# Patient Record
Sex: Male | Born: 1970
Health system: Southern US, Community
[De-identification: ages and names within clinical notes are randomized; demographics above are authoritative.]

## PROBLEM LIST (undated history)

## (undated) DIAGNOSIS — K219 Gastro-esophageal reflux disease without esophagitis: Secondary | ICD-10-CM

## (undated) DIAGNOSIS — E785 Hyperlipidemia, unspecified: Secondary | ICD-10-CM

## (undated) DIAGNOSIS — E119 Type 2 diabetes mellitus without complications: Secondary | ICD-10-CM

## (undated) DIAGNOSIS — I10 Essential (primary) hypertension: Secondary | ICD-10-CM

## (undated) DIAGNOSIS — T7840XA Allergy, unspecified, initial encounter: Secondary | ICD-10-CM

## (undated) HISTORY — PX: UPPER GASTROINTESTINAL ENDOSCOPY: SHX188

## (undated) HISTORY — DX: Essential (primary) hypertension: I10

## (undated) HISTORY — PX: SMALL INTESTINE SURGERY: SHX150

## (undated) HISTORY — DX: Hyperlipidemia, unspecified: E78.5

## (undated) HISTORY — DX: Allergy, unspecified, initial encounter: T78.40XA

## (undated) HISTORY — DX: Type 2 diabetes mellitus without complications: E11.9

## (undated) HISTORY — PX: COLONOSCOPY: SHX174

## (undated) HISTORY — DX: Gastro-esophageal reflux disease without esophagitis: K21.9

## (undated) HISTORY — PX: NO PAST SURGERIES: SHX2092

---

## 2011-01-26 ENCOUNTER — Other Ambulatory Visit: Payer: Self-pay | Admitting: Gastroenterology

## 2011-01-26 DIAGNOSIS — R1110 Vomiting, unspecified: Secondary | ICD-10-CM

## 2011-01-26 DIAGNOSIS — R12 Heartburn: Secondary | ICD-10-CM

## 2011-02-02 ENCOUNTER — Other Ambulatory Visit: Payer: Self-pay

## 2011-04-29 ENCOUNTER — Ambulatory Visit
Admission: RE | Admit: 2011-04-29 | Discharge: 2011-04-29 | Disposition: A | Discharge status: Home / Self Care | Payer: BC Managed Care – PPO | Source: Ambulatory Visit | Attending: Gastroenterology | Admitting: Gastroenterology

## 2011-04-29 DIAGNOSIS — R12 Heartburn: Secondary | ICD-10-CM

## 2011-04-29 DIAGNOSIS — R1110 Vomiting, unspecified: Secondary | ICD-10-CM

## 2014-04-03 ENCOUNTER — Ambulatory Visit (INDEPENDENT_AMBULATORY_CARE_PROVIDER_SITE_OTHER): Payer: 59 | Admitting: Family Medicine

## 2014-04-03 VITALS — BP 136/82 | HR 69 | Temp 97.7°F | Resp 17 | Ht 75.0 in | Wt 198.0 lb

## 2014-04-03 DIAGNOSIS — E119 Type 2 diabetes mellitus without complications: Secondary | ICD-10-CM

## 2014-04-03 DIAGNOSIS — B360 Pityriasis versicolor: Secondary | ICD-10-CM

## 2014-04-03 DIAGNOSIS — Z7251 High risk heterosexual behavior: Secondary | ICD-10-CM

## 2014-04-03 DIAGNOSIS — Z13220 Encounter for screening for lipoid disorders: Secondary | ICD-10-CM

## 2014-04-03 DIAGNOSIS — Z0000 Encounter for general adult medical examination without abnormal findings: Secondary | ICD-10-CM | POA: Diagnosis not present

## 2014-04-03 DIAGNOSIS — R739 Hyperglycemia, unspecified: Secondary | ICD-10-CM

## 2014-04-03 DIAGNOSIS — Z23 Encounter for immunization: Secondary | ICD-10-CM | POA: Diagnosis not present

## 2014-04-03 LAB — POCT URINALYSIS DIPSTICK
BILIRUBIN UA: NEGATIVE
Glucose, UA: 250
KETONES UA: NEGATIVE
LEUKOCYTES UA: NEGATIVE
Nitrite, UA: NEGATIVE
PH UA: 5.5
Protein, UA: NEGATIVE
Spec Grav, UA: >=1.03
UROBILINOGEN UA: 0.2

## 2014-04-03 LAB — POCT GLYCOSYLATED HEMOGLOBIN (HGB A1C): HEMOGLOBIN A1C: 7.3

## 2014-04-03 LAB — GLUCOSE, POCT (MANUAL RESULT ENTRY): POC Glucose: 114 mg/dl — AB (ref 70–99)

## 2014-04-03 MED ORDER — CLOTRIMAZOLE 1 % EX CREA: 1.0000 "application " | TOPICAL_CREAM | Freq: Two times a day (BID) | CUTANEOUS | Status: DC

## 2014-04-03 MED ORDER — KETOCONAZOLE 200 MG PO TABS: 400.0000 mg | ORAL_TABLET | Freq: Once | ORAL | Status: DC

## 2014-04-03 MED ORDER — METFORMIN HCL 500 MG PO TABS: 500.0000 mg | ORAL_TABLET | Freq: Every day | ORAL | Status: DC

## 2014-04-03 NOTE — Progress Notes (Addendum)
Subjective:  This chart was scribed for Kevin Evans by [facility] Demirci,scribe, at [facility].  This patient was seen in room 2 and the patient's care was started at 3:07 PM.   Patient ID: Kevin Evans, male    DOB: Apr 13, 1970, 44 y.o.   MRN: [id number]  HPI  HPI Comments: Kevin Evans is a 44 y.o. male who presents to [facility] for a complete physical.  He is a new patient here at [facility].  Patient states that last time he was at the doctor, he had elevated blood sugar levels (home readings in the 90's). They put him on metformin once a day but he has not been on that medication for a few years.  Patient last ate 4 hours ago.  He notes he is sexually active and has had new sexual partners since last sti testing. Kevin KitchenHe has not been checked for STI's recently and was last checked for an STI 8-9 years ago. He has had close to  7-8 lifetime partners. His aunt has cancer and his family has a history of diabetes (father's sisters). 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 He was in [place] recently during Christmas noticed rash on upper chest since returning from [place].   Patient denies abdominal pain.    Immunizations: Hepatitis B in 2008, Tetanus 2008, Flu Vaccine in 2008     Exercise: Patient notes that it is difficult to exercise ever since he started driving.    Dentist: Patient has a Pharmacist, community and saw him in September 2015  Eye care provider: Saw his eye doctor 4-5 months ago.    Review of Systems  HENT: Positive for ear discharge (patient notes that he has no acute problems ).   Musculoskeletal: Positive for myalgias and neck pain (neck pain that comes and goes due to driving ).  Skin: Positive for rash (dry skin infront of his knee (chronic)  and on left neck (which he notes is new) ).    Thirteen point ROS reviewed on patient health survey. Positive only as below or as in HPI.       Objective:   Physical Exam    Constitutional: He is oriented to person, place, and time. He appears well-developed and well-nourished.  HENT:  Head: Normocephalic and atraumatic.  Right Ear: External ear normal.  Left Ear: External ear normal.  Mouth/Throat: Oropharynx is clear and moist.  Eyes: Conjunctivae and EOM are normal. Pupils are equal, round, and reactive to light.  Neck: Normal range of motion. Neck supple. No thyromegaly present.  Cardiovascular: Normal rate, regular rhythm, normal heart sounds and intact distal pulses.   Pulmonary/Chest: Effort normal and breath sounds normal. No respiratory distress. He has no wheezes.  Abdominal: Soft. He exhibits no distension. There is no tenderness.  Musculoskeletal: Normal range of motion. He exhibits no edema or tenderness.  Cervical : full range of motion, no focal tenderness.   Lymphadenopathy:    He has no cervical adenopathy.  Neurological: He is alert and oriented to person, place, and time. He has normal reflexes.  Skin: Skin is warm and dry.  Left anterior knee and lower knee there is thickened dry skin without surrounding rash or erythema   Upper chest and left shoulder there are 7 patches with hyperpigmented skin, slightly raised with a faint scale.    No other rash on chest or upper back.    Psychiatric: He has a normal mood and  affect. His behavior is normal.  Vitals reviewed.  Filed Vitals:   04/03/14 1421  BP: 136/82  Pulse: 69  Temp: 97.7 F (36.5 C)  TempSrc: Oral  Resp: 17  Height: 6\' 3"  (1.905 m)  Weight: 198 lb (89.812 kg)  SpO2: 99%     Visual Acuity Screening   Right eye Left eye Both eyes  Without correction:     With correction: 20/20 20/20 20/20        Results for orders placed or performed in visit on 04/03/14  POCT glucose (manual entry)  Result Value Ref Range   POC Glucose 114 (A) 70 - 99 mg/dl  POCT glycosylated hemoglobin (Hb A1C)  Result Value Ref Range   Hemoglobin A1C 7.3   POCT urinalysis dipstick  Result  Value Ref Range   Color, UA yellow    Clarity, UA clear    Glucose, UA 250    Bilirubin, UA neg    Ketones, UA neg    Spec Grav, UA >=1.030    Blood, UA trace-lysed    pH, UA 5.5    Protein, UA neg    Urobilinogen, UA 0.2    Nitrite, UA neg    Leukocytes, UA Negative     Assessment & Plan:   Kevin Evans is a 44 y.o. male Annual physical exam --anticipatory guidance as below in AVS, screening labs above. Health maintenance items as above in HPI.   discussed/recommended as applicable.    Hyperglycemia - Plan: POCT glucose (manual entry), POCT glycosylated hemoglobin (Hb A1C), POCT urinalysis dipstick, Comprehensive metabolic panel, Lipid panel  - diabetic by HGBA1C. Will restart metformin 500mg  QD.  SED  -plan on recheck in 3 months.  - diet and exercise.   Screening for hyperlipidemia - Plan: Comprehensive metabolic panel, Lipid panel drawn. If elevated, would likley recommend statin with underlying diabetes.   High risk sexual behavior - Plan: GC/Chlamydia Probe Amp, HIV antibody, RPR  -multiple prior sexual partners. Testing performed as above, safer sex practices.   Need for prophylactic vaccination and inoculation against influenza - Plan: Flu Vaccine QUAD 36+ mos IM given.   Tinea versicolor - Plan: ketoconazole (NIZORAL) 200 MG tablet, clotrimazole (LOTRIMIN) 1 % cream  - mild, only few areas,  Trial of topical clotrimazole for 2 weeks, then if not improving - can take Nizoral 400mg  x 1. rtc precautions.   Meds ordered this encounter  Medications  . ketoconazole (NIZORAL) 200 MG tablet    Sig: Take 2 tablets (400 mg total) by mouth once. For rash on upper chest.    Dispense:  2 tablet    Refill:  0  . clotrimazole (LOTRIMIN) 1 % cream    Sig: Apply 1 application topically 2 (two) times daily.    Dispense:  30 g    Refill:  0   Patient Instructions  For the rash on your upper chest - this may be due to a fungus - see below. Can try the clotrimazole twice per day  for next 2 weeks, but if this does not resolve, or rash spreads - take the 2 pills i printed - both at once, then sweat/exercise in 1 hour, followed by shower in 2-3 hours.  For your rash on the lower leg, try over the counter Eucerin or Lubriderm, then hydrocortisone cream twice per day if needed for itching. If this worsens - return for recheck.   You should receive a call or letter about your lab results within the next week  to 10 days.   Tinea Versicolor Tinea versicolor is a common yeast infection of the skin. This condition becomes known when the yeast on our skin starts to overgrow (yeast is a normal inhabitant on our skin). This condition is noticed as white or light brown patches on brown skin, and is more evident in the summer on tanned skin. These areas are slightly scaly if scratched. The light patches from the yeast become evident when the yeast creates "holes in your suntan". This is most often noticed in the summer. The patches are usually located on the chest, back, pubis, neck and body folds. However, it may occur on any area of body. Mild itching and inflammation (redness or soreness) may be present. DIAGNOSIS  The diagnosisof this is made clinically (by looking). Cultures from samples are usually not needed. Examination under the microscope may help. However, yeast is normally found on skin. The diagnosis still remains clinical. Examination under Wood's Ultraviolet Light can determine the extent of the infection. TREATMENT  This common infection is usually only of cosmetic (only a concern to your appearance). It is easily treated with dandruff shampoo used during showers or bathing. Vigorous scrubbing will eliminate the yeast over several days time. The light areas in your skin may remain for weeks or months after the infection is cured unless your skin is exposed to sunlight. The lighter or darker spots caused by the fungus that remain after complete treatment are not a sign of  treatment failure; it will take a long time to resolve. Your caregiver may recommend a number of commercial preparations or medication by mouth if home care is not working. Recurrence is common and preventative medication may be necessary. This skin condition is not highly contagious. Special care is not needed to protect close friends and family members. Normal hygiene is usually enough. Follow up is required only if you develop complications (such as a secondary infection from scratching), if recommended by your caregiver, or if no relief is obtained from the preparations used. Document Released: 03/12/2000 Document Revised: 06/07/2011 Document Reviewed: 04/24/2008 [facility] Patient Information 2015 [place], [place]. This information is not intended to replace advice given to you by your health care provider. Make sure you discuss any questions you have with your health care provider.   Keeping you healthy  Get these tests  Blood pressure- Have your blood pressure checked once a year by your healthcare provider.  Normal blood pressure is 120/80.  Weight- Have your body mass index (BMI) calculated to screen for obesity.  BMI is a measure of body fat based on height and weight. You can also calculate your own BMI at GravelBags.it.  Cholesterol- Have your cholesterol checked regularly starting at age 3, sooner may be necessary if you have diabetes, high blood pressure, if a family member developed heart diseases at an early age or if you smoke.   Chlamydia, HIV, and other sexual transmitted disease- Get screened each year until the age of 75 then within three months of each new sexual partner.  Diabetes- Have your blood sugar checked regularly if you have high blood pressure, high cholesterol, a family history of diabetes or if you are overweight.  Get these vaccines  Flu shot- Every fall.  Tetanus shot- Every 10 years.  Menactra- Single dose; prevents meningitis.  Take these  steps  Don't smoke- If you do smoke, ask your healthcare provider about quitting. For tips on how to quit, go to www.smokefree.gov or call 1-800-QUIT-NOW.  Be physically  active- Exercise 5 days a week for at least 30 minutes.  If you are not already physically active start slow and gradually work up to 30 minutes of moderate physical activity.  Examples of moderate activity include walking briskly, mowing the yard, dancing, swimming bicycling, etc.  Eat a healthy diet- Eat a variety of healthy foods such as fruits, vegetables, low fat milk, low fat cheese, yogurt, lean meats, poultry, fish, beans, tofu, etc.  For more information on healthy eating, go to www.thenutritionsource.org  Drink alcohol in moderation- Limit alcohol intake two drinks or less a day.  Never drink and drive.  Dentist- Brush and floss teeth twice daily; visit your dentis twice a year.  Depression-Your emotional health is as important as your physical health.  If you're feeling down, losing interest in things you normally enjoy please talk with your healthcare provider.  Gun Safety- If you keep a gun in your home, keep it unloaded and with the safety lock on.  Bullets should be stored separately.  Helmet use- Always wear a helmet when riding a motorcycle, bicycle, rollerblading or skateboarding.  Safe sex- If you may be exposed to a sexually transmitted infection, use a condom  Seat belts- Seat bels can save your life; always wear one.  Smoke/Carbon Monoxide detectors- These detectors need to be installed on the appropriate level of your home.  Replace batteries at least once a year.  Skin Cancer- When out in the sun, cover up and use sunscreen SPF 15 or higher.  Violence- If anyone is threatening or hurting you, please tell your healthcare provider.        I personally performed the services described in this documentation, which was scribed in my presence. The recorded information has been reviewed and  considered, and addended by me as needed.

## 2014-04-03 NOTE — Patient Instructions (Addendum)
For the rash on your upper chest - this may be due to a fungus - see below. Can try the clotrimazole twice per day for next 2 weeks, but if this does not resolve, or rash spreads - take the 2 pills i printed - both at once, then sweat/exercise in 1 hour, followed by shower in 2-3 hours.  For your rash on the lower leg, try over the counter Eucerin or Lubriderm, then hydrocortisone cream twice per day if needed for itching. If this worsens - return for recheck.   Your blood sugar and 3 month average does indicate early or mild diabetes. Restart metformin once per day, watch diet and exercise as discussed and recheck in 3 months. Return to the clinic or go to the nearest emergency room if any of your symptoms worsen or new symptoms occur.   You should receive a call or letter about your lab results within the next week to 10 days.   Tinea Versicolor Tinea versicolor is a common yeast infection of the skin. This condition becomes known when the yeast on our skin starts to overgrow (yeast is a normal inhabitant on our skin). This condition is noticed as white or light brown patches on brown skin, and is more evident in the summer on tanned skin. These areas are slightly scaly if scratched. The light patches from the yeast become evident when the yeast creates "holes in your suntan". This is most often noticed in the summer. The patches are usually located on the chest, back, pubis, neck and body folds. However, it may occur on any area of body. Mild itching and inflammation (redness or soreness) may be present. DIAGNOSIS  The diagnosisof this is made clinically (by looking). Cultures from samples are usually not needed. Examination under the microscope may help. However, yeast is normally found on skin. The diagnosis still remains clinical. Examination under Wood's Ultraviolet Light can determine the extent of the infection. TREATMENT  This common infection is usually only of cosmetic (only a concern to  your appearance). It is easily treated with dandruff shampoo used during showers or bathing. Vigorous scrubbing will eliminate the yeast over several days time. The light areas in your skin may remain for weeks or months after the infection is cured unless your skin is exposed to sunlight. The lighter or darker spots caused by the fungus that remain after complete treatment are not a sign of treatment failure; it will take a long time to resolve. Your caregiver may recommend a number of commercial preparations or medication by mouth if home care is not working. Recurrence is common and preventative medication may be necessary. This skin condition is not highly contagious. Special care is not needed to protect close friends and family members. Normal hygiene is usually enough. Follow up is required only if you develop complications (such as a secondary infection from scratching), if recommended by your caregiver, or if no relief is obtained from the preparations used. Document Released: 03/12/2000 Document Revised: 06/07/2011 Document Reviewed: 04/24/2008 [facility] Patient Information 2015 [place], [place]. This information is not intended to replace advice given to you by your health care provider. Make sure you discuss any questions you have with your health care provider.   Keeping you healthy  Get these tests  Blood pressure- Have your blood pressure checked once a year by your healthcare provider.  Normal blood pressure is 120/80.  Weight- Have your body mass index (BMI) calculated to screen for obesity.  BMI is a measure  of body fat based on height and weight. You can also calculate your own BMI at [employer]velBags.it.  Cholesterol- Have your cholesterol checked regularly starting at age 60, sooner may be necessary if you have diabetes, high blood pressure, if a family member developed heart diseases at an early age or if you smoke.   Chlamydia, HIV, and other sexual transmitted disease- Get  screened each year until the age of 66 then within three months of each new sexual partner.  Diabetes- Have your blood sugar checked regularly if you have high blood pressure, high cholesterol, a family history of diabetes or if you are overweight.  Get these vaccines  Flu shot- Every fall.  Tetanus shot- Every 10 years.  Menactra- Single dose; prevents meningitis.  Take these steps  Don't smoke- If you do smoke, ask your healthcare provider about quitting. For tips on how to quit, go to www.smokefree.gov or call 1-800-QUIT-NOW.  Be physically active- Exercise 5 days a week for at least 30 minutes.  If you are not already physically active start slow and gradually work up to 30 minutes of moderate physical activity.  Examples of moderate activity include walking briskly, mowing the yard, dancing, swimming bicycling, etc.  Eat a healthy diet- Eat a variety of healthy foods such as fruits, vegetables, low fat milk, low fat cheese, yogurt, lean meats, poultry, fish, beans, tofu, etc.  For more information on healthy eating, go to www.thenutritionsource.org  Drink alcohol in moderation- Limit alcohol intake two drinks or less a day.  Never drink and drive.  Dentist- Brush and floss teeth twice daily; visit your dentis twice a year.  Depression-Your emotional health is as important as your physical health.  If you're feeling down, losing interest in things you normally enjoy please talk with your healthcare provider.  Gun Safety- If you keep a gun in your home, keep it unloaded and with the safety lock on.  Bullets should be stored separately.  Helmet use- Always wear a helmet when riding a motorcycle, bicycle, rollerblading or skateboarding.  Safe sex- If you may be exposed to a sexually transmitted infection, use a condom  Seat belts- Seat bels can save your life; always wear one.  Smoke/Carbon Monoxide detectors- These detectors need to be installed on the appropriate level of your  home.  Replace batteries at least once a year.  Skin Cancer- When out in the sun, cover up and use sunscreen SPF 15 or higher.  Violence- If anyone is threatening or hurting you, please tell your healthcare provider.

## 2014-04-04 LAB — COMPREHENSIVE METABOLIC PANEL
ALK PHOS: 66 U/L (ref 39–117)
ALT: 32 U/L (ref 0–53)
AST: 27 U/L (ref 0–37)
Albumin: 4.7 g/dL (ref 3.5–5.2)
BUN: 12 mg/dL (ref 6–23)
CALCIUM: 10.2 mg/dL (ref 8.4–10.5)
CO2: 30 mEq/L (ref 19–32)
CREATININE: 0.94 mg/dL (ref 0.50–1.35)
Chloride: 99 mEq/L (ref 96–112)
Glucose, Bld: 115 mg/dL — ABNORMAL HIGH (ref 70–99)
POTASSIUM: 4.1 meq/L (ref 3.5–5.3)
SODIUM: 138 meq/L (ref 135–145)
Total Bilirubin: 0.4 mg/dL (ref 0.2–1.2)
Total Protein: 8.3 g/dL (ref 6.0–8.3)

## 2014-04-04 LAB — LIPID PANEL
CHOLESTEROL: 232 mg/dL — AB (ref 0–200)
HDL: 40 mg/dL (ref >39)
LDL Cholesterol: 148 mg/dL — ABNORMAL HIGH (ref 0–99)
Total CHOL/HDL Ratio: 5.8 Ratio
Triglycerides: 221 mg/dL — ABNORMAL HIGH (ref <150)
VLDL: 44 mg/dL — ABNORMAL HIGH (ref 0–40)

## 2014-04-04 LAB — GC/CHLAMYDIA PROBE AMP
CT Probe RNA: NEGATIVE
GC PROBE AMP APTIMA: NEGATIVE

## 2014-04-04 LAB — RPR

## 2014-04-04 LAB — HIV ANTIBODY (ROUTINE TESTING W REFLEX): HIV: NONREACTIVE

## 2014-04-05 ENCOUNTER — Other Ambulatory Visit: Payer: Self-pay | Admitting: Family Medicine

## 2014-04-05 DIAGNOSIS — E785 Hyperlipidemia, unspecified: Secondary | ICD-10-CM

## 2014-04-05 MED ORDER — ATORVASTATIN CALCIUM 10 MG PO TABS: 10.0000 mg | ORAL_TABLET | Freq: Every day | ORAL | Status: DC

## 2014-04-13 ENCOUNTER — Encounter: Payer: Self-pay | Admitting: *Deleted

## 2014-04-23 ENCOUNTER — Other Ambulatory Visit: Payer: Self-pay | Admitting: Family Medicine

## 2014-04-23 DIAGNOSIS — E119 Type 2 diabetes mellitus without complications: Secondary | ICD-10-CM

## 2014-04-23 MED ORDER — BLOOD GLUCOSE MONITORING SUPPL W/DEVICE KIT: PACK | Status: DC

## 2014-07-09 ENCOUNTER — Ambulatory Visit (INDEPENDENT_AMBULATORY_CARE_PROVIDER_SITE_OTHER): Payer: 59 | Admitting: Urgent Care

## 2014-07-09 VITALS — BP 122/74 | HR 68 | Temp 98.0°F | Resp 18 | Ht 76.0 in | Wt 204.0 lb

## 2014-07-09 DIAGNOSIS — E785 Hyperlipidemia, unspecified: Secondary | ICD-10-CM

## 2014-07-09 DIAGNOSIS — E119 Type 2 diabetes mellitus without complications: Secondary | ICD-10-CM

## 2014-07-09 LAB — POCT GLYCOSYLATED HEMOGLOBIN (HGB A1C): HEMOGLOBIN A1C: 7.1

## 2014-07-09 MED ORDER — ATORVASTATIN CALCIUM 10 MG PO TABS: 10.0000 mg | ORAL_TABLET | Freq: Every day | ORAL | Status: DC

## 2014-07-09 MED ORDER — METFORMIN HCL 500 MG PO TABS: 500.0000 mg | ORAL_TABLET | Freq: Every day | ORAL | Status: DC

## 2014-07-09 MED ORDER — BLOOD GLUCOSE MONITORING SUPPL W/DEVICE KIT: PACK | Status: DC

## 2014-07-09 NOTE — Progress Notes (Signed)
    MRN: [id number] DOB: [birth year]  Subjective:   Kevin Evans is a 44 y.o. male presenting for chief complaint of dm check and rx refills  DM - diagnosed 03/2014, started on Metformin 500mg  QD. Denies metallic taste, nausea and vomiting, diarrhea, abdominal pain, chest pain, shob, blurred vision, foot wounds or cuts, numbness and tingling. Also denies polyphagia, polydipsia, polyuria. Denies smoking or alcohol use.   HL - taking atorvastatin inconsistently, states that he had difficulty getting Metformin covered. Denies mental fog, myalgia. Diet and exercise as above.  Denies any other aggravating or relieving factors, no other questions or concerns.  Kevin Evans has a current medication list which includes the following prescription(s): metformin, atorvastatin, blood glucose monitoring suppl, clotrimazole, and ketoconazole. He is allergic to bactrim.  Kevin Evans  has a past medical history of Diabetes mellitus without complication and HIV infection. Also  has no past surgical history on file.  ROS As in subjective.  Objective:   Vitals: BP 122/74 mmHg  Pulse 68  Temp(Src) 98 F (36.7 C) (Oral)  Resp 18  Ht 6\' 4"  (1.93 m)  Wt 204 lb (92.534 kg)  BMI 24.84 kg/m2  SpO2 98%  Physical Exam  Constitutional: He is well-developed, well-nourished, and in no distress.  HENT:  Mouth/Throat: Oropharynx is clear and moist. No oropharyngeal exudate.  Eyes: Conjunctivae are normal. Right eye exhibits no discharge. Left eye exhibits no discharge. No scleral icterus.  Cardiovascular: Normal rate, regular rhythm and intact distal pulses.  Exam reveals no gallop and no friction rub.   No murmur heard. Pulmonary/Chest: No stridor. No respiratory distress. He has no wheezes. He has no rales. He exhibits no tenderness.  Abdominal: He exhibits no distension and no mass. There is no tenderness.  Musculoskeletal: He exhibits no edema or tenderness.  No lesions over feet bilaterally.  Lymphadenopathy:    He  has no cervical adenopathy.  Skin: Skin is warm and dry. No rash noted. No erythema. No pallor.   Results for orders placed or performed in visit on 07/09/14 (from the past 24 hour(s))  POCT glycosylated hemoglobin (Hb A1C)     Status: None   Collection Time: 07/09/14  6:02 PM  Result Value Ref Range   Hemoglobin A1C 7.1    Assessment and Plan :   1. Type 2 diabetes mellitus without complication 2. Controlled diabetes mellitus type II without complication - Well-controlled, advised to continue metformin 500mg  QD, advised dietary modifications and continue exercise. Recheck in 6 months.  3. Hyperlipidemia - Labs pending, restart atorvastatin. Diet and exercise as above. Recheck in 6 months.  Jaynee Eagles, PA-C [facility] [phone] 07/09/2014 6:01 PM

## 2014-07-09 NOTE — Patient Instructions (Signed)
Diabetes Mellitus and Food It is important for you to manage your blood sugar (glucose) level. Your blood glucose level can be greatly affected by what you eat. Eating healthier foods in the appropriate amounts throughout the day at about the same time each day will help you control your blood glucose level. It can also help slow or prevent worsening of your diabetes mellitus. Healthy eating may even help you improve the level of your blood pressure and reach or maintain a healthy weight.  HOW CAN FOOD AFFECT ME? Carbohydrates Carbohydrates affect your blood glucose level more than any other type of food. Your dietitian will help you determine how many carbohydrates to eat at each meal and teach you how to count carbohydrates. Counting carbohydrates is important to keep your blood glucose at a healthy level, especially if you are using insulin or taking certain medicines for diabetes mellitus. Alcohol Alcohol can cause sudden decreases in blood glucose (hypoglycemia), especially if you use insulin or take certain medicines for diabetes mellitus. Hypoglycemia can be a life-threatening condition. Symptoms of hypoglycemia (sleepiness, dizziness, and disorientation) are similar to symptoms of having too much alcohol.  If your health care provider has given you approval to drink alcohol, do so in moderation and use the following guidelines:  Women should not have more than one drink per day, and men should not have more than two drinks per day. One drink is equal to:  12 oz of beer.  5 oz of wine.  1 oz of hard liquor.  Do not drink on an empty stomach.  Keep yourself hydrated. Have water, diet soda, or unsweetened iced tea.  Regular soda, juice, and other mixers might contain a lot of carbohydrates and should be counted. WHAT FOODS ARE NOT RECOMMENDED? As you make food choices, it is important to remember that all foods are not the same. Some foods have fewer nutrients per serving than other  foods, even though they might have the same number of calories or carbohydrates. It is difficult to get your body what it needs when you eat foods with fewer nutrients. Examples of foods that you should avoid that are high in calories and carbohydrates but low in nutrients include:  Trans fats (most processed foods list trans fats on the Nutrition Facts label).  Regular soda.  Juice.  Candy.  Sweets, such as cake, pie, doughnuts, and cookies.  Fried foods. WHAT FOODS CAN I EAT? Have nutrient-rich foods, which will nourish your body and keep you healthy. The food you should eat also will depend on several factors, including:  The calories you need.  The medicines you take.  Your weight.  Your blood glucose level.  Your blood pressure level.  Your cholesterol level. You also should eat a variety of foods, including:  Protein, such as meat, poultry, fish, tofu, nuts, and seeds (lean animal proteins are best).  Fruits.  Vegetables.  Dairy products, such as milk, cheese, and yogurt (low fat is best).  Breads, grains, pasta, cereal, rice, and beans.  Fats such as olive oil, trans fat-free margarine, canola oil, avocado, and olives. DOES EVERYONE WITH DIABETES MELLITUS HAVE THE SAME MEAL PLAN? Because every person with diabetes mellitus is different, there is not one meal plan that works for everyone. It is very important that you meet with a dietitian who will help you create a meal plan that is just right for you. Document Released: 12/10/2004 Document Revised: 03/20/2013 Document Reviewed: 02/09/2013 [employer] Patient Information 2015 [employer]. This   information is not intended to replace advice given to you by your health care provider. Make sure you discuss any questions you have with your health care provider.  

## 2014-12-10 ENCOUNTER — Ambulatory Visit (INDEPENDENT_AMBULATORY_CARE_PROVIDER_SITE_OTHER): Payer: 59 | Admitting: Family Medicine

## 2014-12-10 VITALS — BP 128/72 | HR 74 | Temp 98.1°F | Resp 16 | Ht 76.0 in | Wt 194.8 lb

## 2014-12-10 DIAGNOSIS — R197 Diarrhea, unspecified: Secondary | ICD-10-CM | POA: Diagnosis not present

## 2014-12-10 DIAGNOSIS — R634 Abnormal weight loss: Secondary | ICD-10-CM | POA: Diagnosis not present

## 2014-12-10 DIAGNOSIS — Z789 Other specified health status: Secondary | ICD-10-CM | POA: Diagnosis not present

## 2014-12-10 DIAGNOSIS — E119 Type 2 diabetes mellitus without complications: Secondary | ICD-10-CM | POA: Diagnosis not present

## 2014-12-10 LAB — POCT CBC
GRANULOCYTE PERCENT: 34.5 % — AB (ref 37–80)
HCT, POC: 44.4 % (ref 43.5–53.7)
HEMOGLOBIN: 13.9 g/dL — AB (ref 14.1–18.1)
Lymph, poc: 2.3 (ref 0.6–3.4)
MCH: 25 pg — AB (ref 27–31.2)
MCHC: 31.3 g/dL — AB (ref 31.8–35.4)
MCV: 80.1 fL (ref 80–97)
MID (CBC): 0.2 (ref 0–0.9)
MPV: 7.2 fL (ref 0–99.8)
PLATELET COUNT, POC: 238 10*3/uL (ref 142–424)
POC Granulocyte: 1.3 — AB (ref 2–6.9)
POC LYMPH PERCENT: 59.5 %L — AB (ref 10–50)
POC MID %: 6 %M (ref 0–12)
RBC: 5.54 M/uL (ref 4.69–6.13)
RDW, POC: 13.6 %
WBC: 3.9 10*3/uL — AB (ref 4.6–10.2)

## 2014-12-10 LAB — COMPLETE METABOLIC PANEL WITH GFR
ALT: 43 U/L (ref 9–46)
AST: 29 U/L (ref 10–40)
Albumin: 4.4 g/dL (ref 3.6–5.1)
Alkaline Phosphatase: 56 U/L (ref 40–115)
BUN: 15 mg/dL (ref 7–25)
CHLORIDE: 102 mmol/L (ref 98–110)
CO2: 27 mmol/L (ref 20–31)
CREATININE: 0.9 mg/dL (ref 0.60–1.35)
Calcium: 9.8 mg/dL (ref 8.6–10.3)
GFR, Est African American: >89 mL/min (ref >=60)
GFR, Est Non African American: >89 mL/min (ref >=60)
GLUCOSE: 113 mg/dL — AB (ref 65–99)
POTASSIUM: 4 mmol/L (ref 3.5–5.3)
SODIUM: 140 mmol/L (ref 135–146)
Total Bilirubin: 0.3 mg/dL (ref 0.2–1.2)
Total Protein: 7.6 g/dL (ref 6.1–8.1)

## 2014-12-10 LAB — TSH: TSH: 1.321 u[IU]/mL (ref 0.350–4.500)

## 2014-12-10 LAB — POCT GLYCOSYLATED HEMOGLOBIN (HGB A1C): HEMOGLOBIN A1C: 6.6

## 2014-12-10 LAB — GLUCOSE, POCT (MANUAL RESULT ENTRY): POC GLUCOSE: 113 mg/dL — AB (ref 70–99)

## 2014-12-10 NOTE — Progress Notes (Signed)
Subjective:    Patient ID: Kevin Evans, male    DOB: 10-Jan-1971, 44 y.o.   MRN: [id number] This chart was scribed for Meredith Staggers, MD by Littie Deeds, Medical Scribe. This patient was seen in Room 13 and the patient's care was started at 2:20 PM.    HPI HPI Comments: Kevin Evans is a 44 y.o. male with a history of DM who presents to the [facility] complaining of diarrhea and weight loss. He was last seen by me in January of this year for a physical. Was restarted on metformin 500 mg qd at his January visit. Seen in follow-up April 12th by Gurney Maxin, PA-C. A1c was 7.1 so was continued on metformin 500 mg qd. Of note, his weight that visit was 204. He is down 10 pounds today at 194. Plan for follow-up at last visit in 6 months.   Patient went to [place] from mid-April until the end of May. During the last 3 weeks of that time, he began having watery diarrhea. Patient has had intermittent diarrhea almost every day since then, multiple times a day typically after eating. He occasionally has normal bowel movements. He cannot attribute the diarrhea to certain foods. Patient has tried Pepto-Bismol before but without relief. His roommate sometimes has diarrhea as well at some point after he returned from [place]. His roommate is also on metformin. Patient denies appetite change, fever, urinary symptoms, abdominal pain, abdominal distension, nausea and vomiting. He also denies history of thyroid problems. He states he was lactose-intolerant in the past, but has not had any problems recently as he has been consuming lactose without any problems.  He had a good time in [place] and will return in January.  There are no active problems to display for this patient.  Past Medical History  Diagnosis Date  . Diabetes mellitus without complication   . HIV infection    History reviewed. No pertinent past surgical history. Allergies  Allergen Reactions  . Bactrim [Sulfamethoxazole-Trimethoprim]  Other (See Comments)    Itching all over body    Prior to Admission medications   Medication Sig Start Date End Date Taking? Authorizing Provider  atorvastatin (LIPITOR) 10 MG tablet Take 1 tablet (10 mg total) by mouth daily. 07/09/14  Yes Wallis Bamberg, PA-C  clotrimazole (LOTRIMIN) 1 % cream Apply 1 application topically 2 (two) times daily. 04/03/14  Yes Shade Flood, MD  metFORMIN (GLUCOPHAGE) 500 MG tablet Take 1 tablet (500 mg total) by mouth daily with breakfast. 07/09/14  Yes Wallis Bamberg, PA-C  Blood Glucose Monitoring Suppl W/DEVICE KIT Monitor glucose twice daily Patient not taking: Reported on 12/10/2014 07/09/14   Wallis Bamberg, PA-C   Social History   Social History  . Marital Status: Unknown    Spouse Name: N/A  . Number of Children: N/A  . Years of Education: N/A   Occupational History  . Not on file.   Social History Main Topics  . Smoking status: Never Smoker   . Smokeless tobacco: Never Used  . Alcohol Use: No  . Drug Use: No  . Sexual Activity: Yes    Birth Control/ Protection: Condom   Other Topics Concern  . Not on file   Social History Narrative     Review of Systems  Constitutional: Negative for fever and appetite change.  Gastrointestinal: Positive for diarrhea. Negative for nausea, vomiting, abdominal pain and abdominal distention.  Genitourinary: Negative for dysuria, urgency, frequency, hematuria, decreased urine volume, enuresis and difficulty  urinating.       Objective:   Physical Exam  Constitutional: He is oriented to person, place, and time. He appears well-developed and well-nourished. No distress.  HENT:  Head: Normocephalic and atraumatic.  Mouth/Throat: Oropharynx is clear and moist. No oropharyngeal exudate.  Moist oral mucosa. No oral lesion.  Eyes: Pupils are equal, round, and reactive to light.  Neck: Neck supple. No thyromegaly present.  No nodules.  Cardiovascular: Normal rate, regular rhythm and normal heart sounds.  Exam reveals  no gallop and no friction rub.   No murmur heard. Pulmonary/Chest: Effort normal and breath sounds normal. No respiratory distress. He has no wheezes. He has no rales.  Clear to auscultation bilaterally.   Abdominal: Soft. He exhibits no distension. There is no tenderness.  Musculoskeletal: He exhibits no edema.  Neurological: He is alert and oriented to person, place, and time. No cranial nerve deficit.  Skin: Skin is warm and dry. No rash noted.  Psychiatric: He has a normal mood and affect. His behavior is normal.  Nursing note and vitals reviewed.     Filed Vitals:   12/10/14 1257  BP: 128/72  Pulse: 74  Temp: 98.1 F (36.7 C)  TempSrc: Oral  Resp: 16  Height: $Remove'6\' 4"'oopNQDn$  (1.93 m)  Weight: 194 lb 12.8 oz (88.361 kg)  SpO2: 98%   Lab Results  Component Value Date   HGBA1C 6.6 12/10/2014       Assessment & Plan:  Kevin Evans is a 44 y.o. male Weight loss, abnormal, DiarrheaH/O foreign travel - Plan: Gastrointestinal Pathogen Panel PCR  - Plan: Gastrointestinal Pathogen Panel PCR, COMPLETE METABOLIC PANEL WITH GFR, POCT CBC  - persistent diarrhea since 1 travel. Will check GI pathogen panel, but if this is negative, and not improving with a trial off of metformin for a few days, consider GI eval.  patient was advised by staff of overall control of diabetes by his A1c. This was resulted after patient left.. Can try temporary cessation of metformin. If diarrhea persists off of metformin, then may need GI eval.   RTC precautions if worsening.  Type 2 diabetes mellitus without complication - Plan: POCT glucose (manual entry), POCT glycosylated hemoglobin (Hb A1C)  -  Controlled as above. Contrast metformin temporarily to see if this is contributing to his diarrhea. If so, may need to try another agent..   If diarrhea persists off of metformin, restart at same dose for continued control.    No orders of the defined types were placed in this encounter.   Patient Instructions  We will  call you about the results of your hemoglobin A1c later today. As long as your blood sugars are okay, you can try to stop the metformin for a couple of days to see if this is causing your diarrhea.  Once we receive the results of the other stool tests, and other blood tests, we can decide on next step. If your diarrhea is persisting and these labs are normal, I will likely refer you to a gastroenterologist to determine cause of the diarrhea. Return to the clinic or go to the nearest emergency room if any of your symptoms worsen or new symptoms occur.  Diarrhea Diarrhea is frequent loose and watery bowel movements. It can cause you to feel weak and dehydrated. Dehydration can cause you to become tired and thirsty, have a dry mouth, and have decreased urination that often is dark yellow. Diarrhea is a sign of another problem, most often an infection that  will not last long. In most cases, diarrhea typically lasts 2-3 days. However, it can last longer if it is a sign of something more serious. It is important to treat your diarrhea as directed by your caregiver to lessen or prevent future episodes of diarrhea. CAUSES  Some common causes include:  Gastrointestinal infections caused by viruses, bacteria, or parasites.  Food poisoning or food allergies.  Certain medicines, such as antibiotics, chemotherapy, and laxatives.  Artificial sweeteners and fructose.  Digestive disorders. HOME CARE INSTRUCTIONS  Ensure adequate fluid intake (hydration): Have 1 cup (8 oz) of fluid for each diarrhea episode. Avoid fluids that contain simple sugars or sports drinks, fruit juices, whole milk products, and sodas. Your urine should be clear or pale yellow if you are drinking enough fluids. Hydrate with an oral rehydration solution that you can purchase at pharmacies, retail stores, and online. You can prepare an oral rehydration solution at home by mixing the following ingredients together:   - tsp table salt.    tsp baking soda.   tsp salt substitute containing potassium chloride.  1  tablespoons sugar.  1 L (34 oz) of water.  Certain foods and beverages may increase the speed at which food moves through the gastrointestinal (GI) tract. These foods and beverages should be avoided and include:  Caffeinated and alcoholic beverages.  High-fiber foods, such as raw fruits and vegetables, nuts, seeds, and whole grain breads and cereals.  Foods and beverages sweetened with sugar alcohols, such as xylitol, sorbitol, and mannitol.  Some foods may be well tolerated and may help thicken stool including:  Starchy foods, such as rice, toast, pasta, low-sugar cereal, oatmeal, grits, baked potatoes, crackers, and bagels.  Bananas.  Applesauce.  Add probiotic-rich foods to help increase healthy bacteria in the GI tract, such as yogurt and fermented milk products.  Wash your hands well after each diarrhea episode.  Only take over-the-counter or prescription medicines as directed by your caregiver.  Take a warm bath to relieve any burning or pain from frequent diarrhea episodes. SEEK IMMEDIATE MEDICAL CARE IF:   You are unable to keep fluids down.  You have persistent vomiting.  You have blood in your stool, or your stools are black and tarry.  You do not urinate in 6-8 hours, or there is only a small amount of very dark urine.  You have abdominal pain that increases or localizes.  You have weakness, dizziness, confusion, or light-headedness.  You have a severe headache.  Your diarrhea gets worse or does not get better.  You have a fever or persistent symptoms for more than 2-3 days.  You have a fever and your symptoms suddenly get worse. MAKE SURE YOU:   Understand these instructions.  Will watch your condition.  Will get help right away if you are not doing well or get worse. Document Released: 03/05/2002 Document Revised: 07/30/2013 Document Reviewed: 11/21/2011 [facility] Patient  Information 2015 [place], [place]. This information is not intended to replace advice given to you by your health care provider. Make sure you discuss any questions you have with your health care provider.     I personally performed the services described in this documentation, which was scribed in my presence. The recorded information has been reviewed and considered, and addended by me as needed.

## 2014-12-10 NOTE — Patient Instructions (Signed)
We will call you about the results of your hemoglobin A1c later today. As long as your blood sugars are okay, you can try to stop the metformin for a couple of days to see if this is causing your diarrhea.  Once we receive the results of the other stool tests, and other blood tests, we can decide on next step. If your diarrhea is persisting and these labs are normal, I will likely refer you to a gastroenterologist to determine cause of the diarrhea. Return to the clinic or go to the nearest emergency room if any of your symptoms worsen or new symptoms occur.  Diarrhea Diarrhea is frequent loose and watery bowel movements. It can cause you to feel weak and dehydrated. Dehydration can cause you to become tired and thirsty, have a dry mouth, and have decreased urination that often is dark yellow. Diarrhea is a sign of another problem, most often an infection that will not last long. In most cases, diarrhea typically lasts 2-3 days. However, it can last longer if it is a sign of something more serious. It is important to treat your diarrhea as directed by your caregiver to lessen or prevent future episodes of diarrhea. CAUSES  Some common causes include:  Gastrointestinal infections caused by viruses, bacteria, or parasites.  Food poisoning or food allergies.  Certain medicines, such as antibiotics, chemotherapy, and laxatives.  Artificial sweeteners and fructose.  Digestive disorders. HOME CARE INSTRUCTIONS  Ensure adequate fluid intake (hydration): Have 1 cup (8 oz) of fluid for each diarrhea episode. Avoid fluids that contain simple sugars or sports drinks, fruit juices, whole milk products, and sodas. Your urine should be clear or pale yellow if you are drinking enough fluids. Hydrate with an oral rehydration solution that you can purchase at pharmacies, retail stores, and online. You can prepare an oral rehydration solution at home by mixing the following ingredients together:   - tsp table  salt.   tsp baking soda.   tsp salt substitute containing potassium chloride.  1  tablespoons sugar.  1 L (34 oz) of water.  Certain foods and beverages may increase the speed at which food moves through the gastrointestinal (GI) tract. These foods and beverages should be avoided and include:  Caffeinated and alcoholic beverages.  High-fiber foods, such as raw fruits and vegetables, nuts, seeds, and whole grain breads and cereals.  Foods and beverages sweetened with sugar alcohols, such as xylitol, sorbitol, and mannitol.  Some foods may be well tolerated and may help thicken stool including:  Starchy foods, such as rice, toast, pasta, low-sugar cereal, oatmeal, grits, baked potatoes, crackers, and bagels.  Bananas.  Applesauce.  Add probiotic-rich foods to help increase healthy bacteria in the GI tract, such as yogurt and fermented milk products.  Wash your hands well after each diarrhea episode.  Only take over-the-counter or prescription medicines as directed by your caregiver.  Take a warm bath to relieve any burning or pain from frequent diarrhea episodes. SEEK IMMEDIATE MEDICAL CARE IF:   You are unable to keep fluids down.  You have persistent vomiting.  You have blood in your stool, or your stools are black and tarry.  You do not urinate in 6-8 hours, or there is only a small amount of very dark urine.  You have abdominal pain that increases or localizes.  You have weakness, dizziness, confusion, or light-headedness.  You have a severe headache.  Your diarrhea gets worse or does not get better.  You have a fever  or persistent symptoms for more than 2-3 days.  You have a fever and your symptoms suddenly get worse. MAKE SURE YOU:   Understand these instructions.  Will watch your condition.  Will get help right away if you are not doing well or get worse. Document Released: 03/05/2002 Document Revised: 07/30/2013 Document Reviewed:  11/21/2011 [facility] Patient Information 2015 [place], [place]. This information is not intended to replace advice given to you by your health care provider. Make sure you discuss any questions you have with your health care provider.

## 2014-12-12 LAB — GASTROINTESTINAL PATHOGEN PANEL PCR
C. DIFFICILE TOX A/B, PCR: NEGATIVE
CAMPYLOBACTER, PCR: NEGATIVE
Cryptosporidium, PCR: NEGATIVE
E COLI 0157, PCR: NEGATIVE
E coli (ETEC) LT/ST PCR: NEGATIVE
E coli (STEC) stx1/stx2, PCR: NEGATIVE
GIARDIA LAMBLIA, PCR: NEGATIVE
Norovirus, PCR: NEGATIVE
ROTAVIRUS, PCR: NEGATIVE
SALMONELLA, PCR: NEGATIVE
SHIGELLA, PCR: NEGATIVE

## 2014-12-13 ENCOUNTER — Telehealth: Payer: Self-pay

## 2014-12-13 NOTE — Telephone Encounter (Signed)
Pt advised and understood. 

## 2014-12-13 NOTE — Telephone Encounter (Signed)
Yes, let's have him restart the metformin at 500 mg once daily. I see he has been on the medication since January so it's possible the GI upset wasn't from the medicine. He needs to take the med with food. If his GI upset returns after restarting the med he should come back to clinic so someone can see him to determine which if any diabetes med he needs to be on.

## 2014-12-13 NOTE — Telephone Encounter (Signed)
Taken off of medication - since he is off of the medication he has had  no diarrhea.  What does he do now?  Resume taking the medication??   [phone]

## 2014-12-13 NOTE — Telephone Encounter (Signed)
Called and spoke with patient to gain clarification on which medication he was calling about. Pt states he was taken off of the Metformin and has not had anymore diarrhea. He now wants to know should he resume the medication or not. Please advise. Pt would also like to go over lab results.

## 2014-12-13 NOTE — Telephone Encounter (Signed)
Pt called requesting lab results. Please comment on results.  Thank you.

## 2014-12-29 ENCOUNTER — Other Ambulatory Visit: Payer: Self-pay | Admitting: Urgent Care

## 2015-02-05 ENCOUNTER — Ambulatory Visit (INDEPENDENT_AMBULATORY_CARE_PROVIDER_SITE_OTHER): Payer: Self-pay | Admitting: Family Medicine

## 2015-02-05 VITALS — BP 138/78 | HR 64 | Temp 98.3°F | Resp 16 | Ht 77.0 in | Wt 196.0 lb

## 2015-02-05 DIAGNOSIS — Z024 Encounter for examination for driving license: Secondary | ICD-10-CM

## 2015-02-05 DIAGNOSIS — R197 Diarrhea, unspecified: Secondary | ICD-10-CM

## 2015-02-05 DIAGNOSIS — R634 Abnormal weight loss: Secondary | ICD-10-CM

## 2015-02-05 DIAGNOSIS — Z021 Encounter for pre-employment examination: Secondary | ICD-10-CM

## 2015-02-05 NOTE — Patient Instructions (Addendum)
I will refer you to gastroenterology for the diarrhea. Follow up next month for diabetes and repeat urine test as trace blood seen on screening test today.   Over the counter cortisone cream if needed up to 3 times per day and Eucerin or Lubriderm for dry skin on your knees but this appears to be form pressure when kneeling. Try to use more cushioned pad with kneeling.   Return to the clinic or go to the nearest emergency room if any of your symptoms worsen or new symptoms occur.  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

## 2015-02-05 NOTE — Progress Notes (Signed)
Subjective:  This chart was scribed for [name], MD by Moises Blood, Medical Scribe. This patient was seen in room 13 and the patient's care was started 1:37 PM.   Patient ID: Karmen Bongo, male    DOB: June 28, 1970, 44 y.o.   MRN: [id number]  HPI Mir Fullilove is a 44 y.o. male Pt here for DOT physical. H/o type 2 DM, he was having weight loss last seen sept 13th, A1c was 6.6. Tried being off metformin, diarrhea improved. Per phone note, was restarted on sept 16th metformin 500 mg qd. He is still having some diarrhea and very soft stools, since may. He's lost about 16 lbs since it started. He has 1-3 BM a day. GI pathogen panel that was normal. He's planning to travel back to [place] in January. He notes still having itchy rash under his knees, and he's been applying OTC cream on the area.   He was previously given 2-year card. He's taking metformin and lipitor. He denies any eye problems. He wears glasses, and uses them to drive. He denies sleep apnea, snoring, or any daytime hypersomnia. He denies any hematuria. He denies any inguinal pain.   He prays on his knee 5 times a day, kneeling multiple times. He puts a rug down to pray.   Allergies  Allergen Reactions  . Bactrim [Sulfamethoxazole-Trimethoprim] Other (See Comments)    Itching all over body    Review of Systems  Eyes: Negative for pain.  Respiratory: Negative for apnea.   Cardiovascular: Negative for chest pain.  Gastrointestinal: Positive for diarrhea. Negative for nausea, vomiting, abdominal pain, constipation and blood in stool.  Genitourinary: Negative for hematuria.  Skin: Positive for rash (knees).       Objective:   Physical Exam  Constitutional: He is oriented to person, place, and time. He appears well-developed and well-nourished.  HENT:  Head: Normocephalic and atraumatic.  Right Ear: External ear normal.  Left Ear: External ear normal.  Mouth/Throat: Oropharynx is clear and moist.  Eyes: Conjunctivae and  EOM are normal. Pupils are equal, round, and reactive to light.  Neck: Normal range of motion. Neck supple. No thyromegaly present.  Cardiovascular: Normal rate, regular rhythm, normal heart sounds and intact distal pulses.   Pulmonary/Chest: Effort normal and breath sounds normal. No respiratory distress. He has no wheezes.  Abdominal: Soft. He exhibits no distension. There is no tenderness. Hernia confirmed negative in the right inguinal area and confirmed negative in the left inguinal area.  Musculoskeletal: Normal range of motion. He exhibits no edema or tenderness.  Lymphadenopathy:    He has no cervical adenopathy.  Neurological: He is alert and oriented to person, place, and time. He has normal reflexes.  Skin: Skin is warm and dry.  Psychiatric: He has a normal mood and affect. His behavior is normal.    Filed Vitals:   02/05/15 1308  BP: 138/78  Pulse: 64  Temp: 98.3 F (36.8 C)  TempSrc: Oral  Resp: 16  Height: 6\' 5"  (1.956 m)  Weight: 196 lb (88.905 kg)  SpO2: 98%      Assessment & Plan:  Kody Brandl is a 44 y.o. male Diarrhea, unspecified type, weight loss - Plan: Ambulatory referral to Gastroenterology  -Unchanged with trial off of metformin. Persistent symptoms with weight loss previously.   -refer to gastroenterology for further testing or workup.   Calluses on left greater than right tibial tuberosity area of knees. Discussed previously Suspect this is from repetitive pressure on knees  He can try a softer cushioned mat or rug when kneeling.and for thickened skin can use Eucerin or Lubriderm over-the-counter. Also if itching in this area can use over-the-counter cortisone cream. RTC precautions.   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 Trace blood on UA, asymptomatic.  - follow-up with PCP in one month for repeat urine testing and assessment of hyperlipidemia and diabetes. See  DOT paperwork scanned.  No orders of the defined types were placed in this encounter.   Patient Instructions  I will refer you to gastroenterology for the diarrhea. Follow up next month for diabetes and repeat urine test as trace blood seen on screening test today.   Over the counter cortisone cream if needed up to 3 times per day and Eucerin or Lubriderm for dry skin on your knees but this appears to be form pressure when kneeling. Try to use more cushioned pad with kneeling.   Return to the clinic or go to the nearest emergency room if any of your symptoms worsen or new symptoms occur.  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        By signing my name below, I, Moises Blood, attest that this documentation has been prepared under the direction and in the presence of [name], MD. Electronically Signed: Moises Blood, [place]. 02/05/2015 , 1:37 PM .  I personally performed the services described in this documentation, which was scribed in my presence. The recorded information has been reviewed and considered, and addended by me as needed.

## 2015-02-10 ENCOUNTER — Ambulatory Visit (INDEPENDENT_AMBULATORY_CARE_PROVIDER_SITE_OTHER): Payer: 59 | Admitting: Internal Medicine

## 2015-02-10 ENCOUNTER — Other Ambulatory Visit: Payer: 59

## 2015-02-10 ENCOUNTER — Encounter: Payer: Self-pay | Admitting: *Deleted

## 2015-02-10 VITALS — BP 112/74 | HR 80 | Ht 75.25 in | Wt 200.1 lb

## 2015-02-10 DIAGNOSIS — R634 Abnormal weight loss: Secondary | ICD-10-CM | POA: Diagnosis not present

## 2015-02-10 DIAGNOSIS — K529 Noninfective gastroenteritis and colitis, unspecified: Secondary | ICD-10-CM | POA: Diagnosis not present

## 2015-02-10 DIAGNOSIS — F458 Other somatoform disorders: Secondary | ICD-10-CM | POA: Diagnosis not present

## 2015-02-10 DIAGNOSIS — R0989 Other specified symptoms and signs involving the circulatory and respiratory systems: Secondary | ICD-10-CM

## 2015-02-10 DIAGNOSIS — K219 Gastro-esophageal reflux disease without esophagitis: Secondary | ICD-10-CM | POA: Diagnosis not present

## 2015-02-10 MED ORDER — NA SULFATE-K SULFATE-MG SULF 17.5-3.13-1.6 GM/177ML PO SOLN: ORAL | Status: DC

## 2015-02-10 NOTE — Patient Instructions (Signed)
You have been scheduled for an endoscopy and colonoscopy. Please follow the written instructions given to you at your visit today. Please pick up your prep supplies at the pharmacy within the next 1-3 days. If you use inhalers (even only as needed), please bring them with you on the day of your procedure. Your physician has requested that you go to www.startemmi.com and enter the access code given to you at your visit today. This web site gives a general overview about your procedure. However, you should still follow specific instructions given to you by our office regarding your preparation for the procedure.  Your physician has requested that you go to the basement for the following lab work before leaving today: Fecal elastace, WBC stool

## 2015-02-10 NOTE — Progress Notes (Signed)
Patient ID: Kevin Evans, male   DOB: [birth year], 44 y.o.   MRN: [id number] HPI: Onathan Fadness is a  48 -year-old [place] male with past medical history of GERD, diabetes on metformin, hypertension and hyperlipidemia who is seen in consultation at the request of Dr. Nyoka Cowden to evaluate chronic loose stools/diarrhea and weight loss. He reports that while traveling to [place] in May 2016 he developed diarrhea associated with lower abdominal cramping. Since this time he has had issues with change in bowel habit and chronic diarrhea. It is better overall symptomatically than it was when it started though bowel habits have not returned to normal. He has lost 10-15 pounds since May 2016. Some days now he has more normal stools while other days he'll have 4-5 loose stools. Particularly after eating. Denies black or tarry stools. Denies blood in stool. Appetite has been good though he admits he may be eating less to avoid the postprandial loose stool which is quite urgent. He does take metformin 500 mg daily. He tried stopping the medication and loose stools persisted. He has a history of heartburn and lactose intolerance though he notes since his loose stools have started he no longer is lactose intolerant and he's had resolution of gas and bloating. He does have a long history of globus sensation previous he worked up by Dr. Michail Sermon. Barium swallow performed 3 years ago showed mild reflux but was otherwise unremarkable. He did take omeprazole for some time but doesn't remember if it helped globus sensation. He denies true heartburn but does still have mild globus which she does not find troublesome. He denies dysphagia or odynophagia. He had a GI pathogen panel which was negative for infectious etiology. He denies a family history of GI tract malignancy, IBD. He currently works as a Administrator. He is divorced. He does not use alcohol or illicit drugs. No tobacco use.  Past Medical History  Diagnosis Date  . Diabetes  mellitus without complication ([place])   . GERD (gastroesophageal reflux disease)   . HLD (hyperlipidemia)   . HTN (hypertension)     History reviewed. No pertinent past surgical history.  Outpatient Prescriptions Prior to Visit  Medication Sig Dispense Refill  . atorvastatin (LIPITOR) 10 MG tablet Take 1 tablet (10 mg total) by mouth daily. 90 tablet 1  . metFORMIN (GLUCOPHAGE) 500 MG tablet TAKE ONE TABLET BY MOUTH ONCE DAILY WITH BREAKFAST 90 tablet 0   No facility-administered medications prior to visit.    Allergies  Allergen Reactions  . Bactrim [Sulfamethoxazole-Trimethoprim] Other (See Comments)    Itching all over body     Family History  Problem Relation Age of Onset  . Hypertension Mother   . Stroke Mother   . Diabetes Paternal Aunt   . Diabetes Paternal Uncle   . Breast cancer Paternal Aunt     Social History  Substance Use Topics  . Smoking status: Never Smoker   . Smokeless tobacco: Never Used  . Alcohol Use: No    ROS: As per history of present illness, otherwise negative  BP 112/74 mmHg  Pulse 80  Ht 6' 3.25" (1.911 m)  Wt 200 lb 2 oz (90.776 kg)  BMI 24.86 kg/m2 Constitutional: Well-developed and well-nourished. No distress. HEENT: Normocephalic and atraumatic. Oropharynx is clear and moist. No oropharyngeal exudate. Conjunctivae are normal.  No scleral icterus. Neck: Neck supple. Trachea midline. Cardiovascular: Normal rate, regular rhythm and intact distal pulses. No M/R/G Pulmonary/chest: Effort normal and breath sounds normal. No wheezing, rales or  rhonchi. Abdominal: Soft, nontender, nondistended. Bowel sounds active throughout. There are no masses palpable. No hepatosplenomegaly. Extremities: no clubbing, cyanosis, or edema Lymphadenopathy: No cervical adenopathy noted. Neurological: Alert and oriented to person place and time. Skin: Skin is warm and dry. No rashes noted. Psychiatric: Normal mood and affect. Behavior is normal.  RELEVANT  LABS AND IMAGING: CBC    Component Value Date/Time   WBC 3.9* 12/10/2014 1510   RBC 5.54 12/10/2014 1510   HGB 13.9* 12/10/2014 1510   HCT 44.4 12/10/2014 1510   MCV 80.1 12/10/2014 1510   MCH 25.0* 12/10/2014 1510   MCHC 31.3* 12/10/2014 1510    CMP     Component Value Date/Time   NA 140 12/10/2014 1505   K 4.0 12/10/2014 1505   CL 102 12/10/2014 1505   CO2 27 12/10/2014 1505   GLUCOSE 113* 12/10/2014 1505   BUN 15 12/10/2014 1505   CREATININE 0.90 12/10/2014 1505   CALCIUM 9.8 12/10/2014 1505   PROT 7.6 12/10/2014 1505   ALBUMIN 4.4 12/10/2014 1505   AST 29 12/10/2014 1505   ALT 43 12/10/2014 1505   ALKPHOS 56 12/10/2014 1505   BILITOT 0.3 12/10/2014 1505   GFRNONAA >89 12/10/2014 1505   GFRAA >89 12/10/2014 1505   GI pathogen panel -- neg  Lab Results  Component Value Date   TSH 1.321 12/10/2014   HIV neg RPR neg  ASSESSMENT/PLAN:  35 -year-old [place] male with past medical history of GERD, diabetes on metformin, hypertension and hyperlipidemia who is seen in consultation at the request of Dr. Nyoka Cowden to evaluate chronic loose stools/diarrhea and weight loss.  1. Chronic loose stools/weight loss -- symptoms started fairly acutely while traveling to [place]. This makes an infectious etiology quite likely. GI pathogen panel recently negative making more chronic infection unlikely. He may have a postinfectious irritable bowel though one would not expect the weight loss. I recommended stool test for fecal elastase and fecal leukocytes. I've also recommended upper endoscopy and colonoscopy to further evaluate diarrhea, history of GERD, and weight loss. We discussed the risks, benefits and alternatives to endoscopy and he is agreeable to proceed. If negative would consider a probiotic for postinfectious IBS. Other considerations include bacterial overgrowth.  [id number] R Greene, [address] [place], [place] [id number]

## 2015-02-22 LAB — PANCREATIC ELASTASE, FECAL: Pancreatic Elastase-1, Stool: >500 mcg/g

## 2015-03-06 ENCOUNTER — Ambulatory Visit (INDEPENDENT_AMBULATORY_CARE_PROVIDER_SITE_OTHER): Payer: 59 | Admitting: Physician Assistant

## 2015-03-06 VITALS — BP 126/86 | HR 76 | Temp 97.9°F | Resp 16 | Ht 76.0 in | Wt 199.0 lb

## 2015-03-06 DIAGNOSIS — Z23 Encounter for immunization: Secondary | ICD-10-CM

## 2015-03-06 DIAGNOSIS — Z79899 Other long term (current) drug therapy: Secondary | ICD-10-CM

## 2015-03-06 DIAGNOSIS — Z418 Encounter for other procedures for purposes other than remedying health state: Secondary | ICD-10-CM | POA: Diagnosis not present

## 2015-03-06 MED ORDER — MEFLOQUINE HCL 250 MG PO TABS: 250.0000 mg | ORAL_TABLET | ORAL | Status: DC

## 2015-03-06 NOTE — Patient Instructions (Addendum)
Please check with your school, if you have had the MMR (which you should have).   If not, you can call me and let me know.  I will place a future lab for you to just come in.   Meningococcal Diphtheria Toxoid Conjugate Vaccine What is this medicine? MENINGOCOCCAL DIPHTHERIA TOXOID CONJUGATE VACCINE (muh ning goh KOK kal dif THEER ee uh TOK soid KON juh geyt vak SEEN) is a vaccine to protect from bacterial meningitis. This vaccine does not contain live bacteria. It will not cause a meningitis. This medicine may be used for other purposes; ask your health care provider or pharmacist if you have questions. What should I tell my health care provider before I take this medicine? They need to know if you have any of these conditions: -bleeding disorder -fever or infection -history of Guillain-Barre syndrome -immune system problems -an unusual or allergic reaction to diphtheria toxoid, meningococcal vaccine, latex, other medicines, foods, dyes, or preservatives -pregnant or trying to get pregnant -breast-feeding How should I use this medicine? This medicine is for injection into a muscle. It is given by a health care professional in a hospital or clinic setting. A copy of Vaccine Information Statements will be given before each vaccination. Read this sheet carefully each time. The sheet may change frequently. Talk to your pediatrician regarding the use of this medicine in children. While some brands of this drug may be prescribed for children as young as 4 months of age for selected conditions, precautions do apply. Overdosage: If you think you have taken too much of this medicine contact a poison control center or emergency room at once. NOTE: This medicine is only for you. Do not share this medicine with others. What if I miss a dose? This does not apply. What may interact with this medicine? -adalimumab -anakinra -infliximab -medicines for organ transplant -medicines to treat  cancer -medicines used during some procedures to diagnose a medical condition -other vaccines -some medicines for arthritis -steroid medicines like prednisone or cortisone This list may not describe all possible interactions. Give your health care provider a list of all the medicines, herbs, non-prescription drugs, or dietary supplements you use. Also tell them if you smoke, drink alcohol, or use illegal drugs. Some items may interact with your medicine. What should I watch for while using this medicine? Report any side effects that are worrisome to your doctor right away. Call your doctor if you have any unusual symptoms within 6 weeks of getting this vaccine. This vaccine may not protect from all meningitis infections. Women should inform their doctor if they wish to become pregnant or think they might be pregnant. Talk to your health care professional or pharmacist for more information. What side effects may I notice from receiving this medicine? Side effects that you should report to your doctor or health care professional as soon as possible: -allergic reactions like skin rash, itching or hives, swelling of the face, lips, or tongue -breathing problems -feeling faint or lightheaded, falls -fever over 102 degrees F -muscle weakness -unusual drooping or paralysis of face Side effects that usually do not require medical attention (report to your doctor or health care professional if they continue or are bothersome): -chills -diarrhea -headache -loss of appetite -muscle aches and pains -pain at site where injected -tired This list may not describe all possible side effects. Call your doctor for medical advice about side effects. You may report side effects to FDA at 1-800-FDA-1088. Where should I keep my medicine? This  drug is given in a hospital or clinic and will not be stored at home. NOTE: This sheet is a summary. It may not cover all possible information. If you have questions  about this medicine, talk to your doctor, pharmacist, or health care provider.    2016, Elsevier/Gold Standard. (2009-08-05 21:41:10)  Hepatitis A Vaccine, Inactivated suspension for injection What is this medicine? HEPATITIS A VACCINE (hep uh TAHY tis A VAK seen) is a vaccine to protect from an infection with the hepatitis A virus. This vaccine does not contain the live virus. It will not cause a hepatitis infection. This vaccine is also used with immunoglobulin to prevent infection in people who have been exposed to hepatitis A. This medicine may be used for other purposes; ask your health care provider or pharmacist if you have questions. What should I tell my health care provider before I take this medicine? They need to know if you have any of these conditions: -bleeding disorder -fever or infection -heart disease -immune system problems -an unusual or allergic reaction to hepatitis A vaccine, latex, neomycin, other medicines, foods, dyes, or preservatives -pregnant or trying to get pregnant -breast-feeding How should I use this medicine? This vaccine is for injection into a muscle. It is given by a health care professional. A copy of Vaccine Information Statements will be given before each vaccination. Read this sheet carefully each time. The sheet may change frequently. Talk to your pediatrician regarding the use of this medicine in children. While this drug may be prescribed for children as young as 36 months of age for selected conditions, precautions do apply. Overdosage: If you think you have taken too much of this medicine contact a poison control center or emergency room at once. NOTE: This medicine is only for you. Do not share this medicine with others. What if I miss a dose? This does not apply. What may interact with this medicine? -medicines to treat cancer -medicines that suppress your immune function like adalimumab, anakinra, etanercept, infliximab -steroid medicines  like prednisone or cortisone This list may not describe all possible interactions. Give your health care provider a list of all the medicines, herbs, non-prescription drugs, or dietary supplements you use. Also tell them if you smoke, drink alcohol, or use illegal drugs. Some items may interact with your medicine. What should I watch for while using this medicine? See your health care provider for a booster shot of this vaccine as directed. Tell your doctor right away if you have any serious or unusual side effects after getting this vaccine. You will not have protection from the hepatitis A virus for at least 8 to 10 days after your first injection. The length of time you will have protection from hepatitis A virus infection is not known. Check with your doctor if you have questions about your immunity. See your doctor before you travel out of the country. What side effects may I notice from receiving this medicine? Side effects that you should report to your doctor or health care professional as soon as possible: -allergic reactions like skin rash, itching or hives, swelling of the face, lips, or tongue -breathing problems -seizures -yellowing of the eyes or skin Side effects that usually do not require medical attention (report to your doctor or health care professional if they continue or are bothersome): -diarrhea -fever -loss of appetite -muscle pain -nausea -pain, redness, swelling or irritation at site where injected -tiredness This list may not describe all possible side effects. Call your doctor  for medical advice about side effects. You may report side effects to FDA at 1-800-FDA-1088. Where should I keep my medicine? This drug is given in a hospital or clinic and will not be stored at home. NOTE: This sheet is a summary. It may not cover all possible information. If you have questions about this medicine, talk to your doctor, pharmacist, or health care provider.    2016,  Elsevier/Gold Standard. (2013-07-16 13:19:40)  Td Vaccine (Tetanus and Diphtheria): What You Need to Know 1. Why get vaccinated? Tetanus  and diphtheria are very serious diseases. They are rare in the [place] today, but people who do become infected often have severe complications. Td vaccine is used to protect adolescents and adults from both of these diseases. Both tetanus and diphtheria are infections caused by bacteria. Diphtheria spreads from person to person through coughing or sneezing. Tetanus-causing bacteria enter the body through cuts, scratches, or wounds. TETANUS (Lockjaw) causes painful muscle tightening and stiffness, usually all over the body.  It can lead to tightening of muscles in the head and neck so you can't open your mouth, swallow, or sometimes even breathe. Tetanus kills about 1 out of every 10 people who are infected even after receiving the best medical care. DIPHTHERIA can cause a thick coating to form in the back of the throat.  It can lead to breathing problems, paralysis, heart failure, and death. Before vaccines, as many as 200,000 cases of diphtheria and hundreds of cases of tetanus were reported in the [place] each year. Since vaccination began, reports of cases for both diseases have dropped by about 99%. 2. Td vaccine Td vaccine can protect adolescents and adults from tetanus and diphtheria. Td is usually given as a booster dose every 10 years but it can also be given earlier after a severe and dirty wound or burn. Another vaccine, called Tdap, which protects against pertussis in addition to tetanus and diphtheria, is sometimes recommended instead of Td vaccine. Your doctor or the person giving you the vaccine can give you more information. Td may safely be given at the same time as other vaccines. 3. Some people should not get this vaccine  A person who has ever had a life-threatening allergic reaction after a previous dose of any tetanus or  diphtheria containing vaccine, OR has a severe allergy to any part of this vaccine, should not get Td vaccine. Tell the person giving the vaccine about any severe allergies.  Talk to your doctor if you:  have seizures or another nervous system problem,  had severe pain or swelling after any vaccine containing diphtheria or tetanus,  ever had a condition called Guillain Barre Syndrome (GBS),  aren't feeling well on the day the shot is scheduled. 4. Risks of a vaccine reaction With any medicine, including vaccines, there is a chance of side effects. These are usually mild and go away on their own. Serious reactions are also possible but are rare. Most people who get Td vaccine do not have any problems with it. Mild Problems  following Td vaccine: (Did not interfere with activities)  Pain where the shot was given (about 8 people in 10)  Redness or swelling where the shot was given (about 1 person in 4)  Mild fever (rare)  Headache (about 1 person in 4)  Tiredness (about 1 person in 4) Moderate Problems following Td vaccine: (Interfered with activities, but did not require medical attention)  Fever over 102F (rare) Severe Problems  following Td vaccine: (  Unable to perform usual activities; required medical attention)  Swelling, severe pain, bleeding and/or redness in the arm where the shot was given (rare). Problems that could happen after any vaccine:  People sometimes faint after a medical procedure, including vaccination. Sitting or lying down for about 15 minutes can help prevent fainting, and injuries caused by a fall. Tell your doctor if you feel dizzy, or have vision changes or ringing in the ears.  Some people get severe pain in the shoulder and have difficulty moving the arm where a shot was given. This happens very rarely.  Any medication can cause a severe allergic reaction. Such reactions from a vaccine are very rare, estimated at fewer than 1 in a million doses, and  would happen within a few minutes to a few hours after the vaccination. As with any medicine, there is a very remote chance of a vaccine causing a serious injury or death. The safety of vaccines is always being monitored. For more information, visit: http://www.aguilar.org/ 5. What if there is a serious reaction? What should I look for?  Look for anything that concerns you, such as signs of a severe allergic reaction, very high fever, or unusual behavior. Signs of a severe allergic reaction can include hives, swelling of the face and throat, difficulty breathing, a fast heartbeat, dizziness, and weakness. These would usually start a few minutes to a few hours after the vaccination. What should I do?  If you think it is a severe allergic reaction or other emergency that can't wait, call 9-1-1 or get the person to the nearest hospital. Otherwise, call your doctor.  Afterward, the reaction should be reported to the Vaccine Adverse Event Reporting System (VAERS). Your doctor might file this report, or you can do it yourself through the VAERS web site at www.vaers.SamedayNews.es, or by calling (250)416-3390. VAERS does not give medical advice. 6. The National Vaccine Injury Compensation Program The [employer] Vaccine Injury Compensation Program (VICP) is a federal program that was created to compensate people who may have been injured by certain vaccines. Persons who believe they may have been injured by a vaccine can learn about the program and about filing a claim by calling 715-732-8313 or visiting the [place] website at GoldCloset.com.ee. There is a time limit to file a claim for compensation. 7. How can I learn more?  Ask your doctor. He or she can give you the vaccine package insert or suggest other sources of information.  Call your local or state health department.  Contact the Centers for Disease Control and Prevention (CDC):  Call 907-730-0432 (1-800-CDC-INFO)  Visit CDC's  website at http://hunter.com/ CDC Td Vaccine VIS (05/22/13)   This information is not intended to replace advice given to you by your health care provider. Make sure you discuss any questions you have with your health care provider.   Document Released: 01/10/2006 Document Revised: 04/05/2014 Document Reviewed: 06/27/2013 Elsevier Interactive Patient Education [employer].

## 2015-03-06 NOTE — Progress Notes (Signed)
[facility] [address], [place] [address] [phone]  Date:  03/06/2015   Name:  Kevin Evans   DOB:  01-Aug-1970   MRN:  [id number]  PCP:  Wendie Agreste, MD   Chief Complaint  Patient presents with  . Immunizations    international travel, [place]     History of Present Illness:  Kevin Evans is a 44 y.o. male patient who presents to [facility] for immunizations. He is leaving in 3 weeks for the [place] to be married.  He will then spend 2 weeks in [place].  He is requesting update of his immunizations at this time. Patient had the yellow fever vaccine today.  He states that he believes he has MMR though his immunization record and paperwork are not demonstrative of this.    There are no active problems to display for this patient.   Past Medical History  Diagnosis Date  . Diabetes mellitus without complication ([place])   . GERD (gastroesophageal reflux disease)   . HLD (hyperlipidemia)   . HTN (hypertension)     History reviewed. No pertinent past surgical history.  Social History  Substance Use Topics  . Smoking status: Never Smoker   . Smokeless tobacco: Never Used  . Alcohol Use: No    Family History  Problem Relation Age of Onset  . Hypertension Mother   . Stroke Mother   . Diabetes Paternal Aunt   . Diabetes Paternal Uncle   . Breast cancer Paternal Aunt     Allergies  Allergen Reactions  . Bactrim [Sulfamethoxazole-Trimethoprim] Other (See Comments)    Itching all over body     Medication list has been reviewed and updated.  Current Outpatient Prescriptions on File Prior to Visit  Medication Sig Dispense Refill  . atorvastatin (LIPITOR) 10 MG tablet Take 1 tablet (10 mg total) by mouth daily. 90 tablet 1  . metFORMIN (GLUCOPHAGE) 500 MG tablet TAKE ONE TABLET BY MOUTH ONCE DAILY WITH BREAKFAST 90 tablet 0  . Na Sulfate-K Sulfate-Mg Sulf SOLN Suprep-Use as directed (Patient not taking: Reported on 03/06/2015) 354 mL 0   No current  facility-administered medications on file prior to visit.    ROS ROS otherwise unremarkable unless listed above.  Physical Examination: BP 126/86 mmHg  Pulse 76  Temp(Src) 97.9 F (36.6 C)  Resp 16  Ht $R'6\' 4"'UT$  (1.93 m)  Wt 199 lb (90.266 kg)  BMI 24.23 kg/m2  SpO2 97% Ideal Body Weight: Weight in (lb) to have BMI = 25: 205  Physical Exam  Constitutional: He is oriented to person, place, and time. He appears well-developed and well-nourished. No distress.  HENT:  Head: Normocephalic and atraumatic.  Eyes: Conjunctivae and EOM are normal. Pupils are equal, round, and reactive to light.  Cardiovascular: Normal rate.   Pulmonary/Chest: Effort normal. No respiratory distress.  Neurological: He is alert and oriented to person, place, and time.  Skin: Skin is warm and dry. He is not diaphoretic.  Psychiatric: He has a normal mood and affect. His behavior is normal.     Assessment and Plan: Kevin Evans is a 44 y.o. male who is here today for need of immunizations prior to travelling abroad to [place] and [place].  Hep A, Td, and menveo given today. Given prophylactic treatment for malaria.  He will take once weekly 2 weeks prior to trip, during, and 4 weeks afterward. Advised to confirm MMR vaccine from college records.  I will order a future lab for him  to return for lab only vaccination if no record.  Need for prophylactic vaccination and inoculation against viral hepatitis - Plan: Hepatitis A vaccine adult IM  Need for tetanus booster - Plan: Td vaccine greater than or equal to 7yo preservative free IM  Need for meningococcal vaccination - Plan: Meningococcal conjugate vaccine 4-valent IM  Chemoprophylaxis - Plan: mefloquine (LARIAM) 250 MG tablet   Ivar Drape, PA-C [facility] 03/06/2015 7:25 PM

## 2015-03-31 ENCOUNTER — Telehealth: Payer: Self-pay | Admitting: Physician Assistant

## 2015-04-01 ENCOUNTER — Encounter: Payer: Self-pay | Admitting: Internal Medicine

## 2015-04-01 ENCOUNTER — Ambulatory Visit (AMBULATORY_SURGERY_CENTER): Payer: BLUE CROSS/BLUE SHIELD | Admitting: Internal Medicine

## 2015-04-01 VITALS — BP 129/87 | HR 66 | Temp 97.1°F | Resp 14 | Ht 75.0 in | Wt 200.0 lb

## 2015-04-01 DIAGNOSIS — K2970 Gastritis, unspecified, without bleeding: Secondary | ICD-10-CM | POA: Diagnosis not present

## 2015-04-01 DIAGNOSIS — K529 Noninfective gastroenteritis and colitis, unspecified: Secondary | ICD-10-CM

## 2015-04-01 DIAGNOSIS — K2950 Unspecified chronic gastritis without bleeding: Secondary | ICD-10-CM | POA: Diagnosis not present

## 2015-04-01 DIAGNOSIS — K219 Gastro-esophageal reflux disease without esophagitis: Secondary | ICD-10-CM

## 2015-04-01 DIAGNOSIS — R197 Diarrhea, unspecified: Secondary | ICD-10-CM

## 2015-04-01 DIAGNOSIS — R634 Abnormal weight loss: Secondary | ICD-10-CM | POA: Diagnosis not present

## 2015-04-01 MED ORDER — PANTOPRAZOLE SODIUM 40 MG PO TBEC: 40.0000 mg | DELAYED_RELEASE_TABLET | Freq: Every day | ORAL | Status: DC

## 2015-04-01 MED ORDER — SODIUM CHLORIDE 0.9 % IV SOLN: 500.0000 mL | INTRAVENOUS | Status: DC

## 2015-04-01 NOTE — Progress Notes (Signed)
Report to PACU, RN, vss, BBS= Clear.  

## 2015-04-01 NOTE — Patient Instructions (Addendum)
Impressions/recomendations:  EGD  Reflux esophagitis (handout given) Hiatal hernia (handout given) Gastritis (handout given)  Pantoprazole 40 mg once daily, 30 minutes before breakfast.  Colonoscopy  Normal   Florastor 250 mg twice daily for 1 month then office follow up if symptoms continue  YOU HAD AN ENDOSCOPIC PROCEDURE TODAY AT [facility]:   Refer to the procedure report that was given to you for any specific questions about what was found during the examination.  If the procedure report does not answer your questions, please call your gastroenterologist to clarify.  If you requested that your care partner not be given the details of your procedure findings, then the procedure report has been included in a sealed envelope for you to review at your convenience later.  YOU SHOULD EXPECT: Some feelings of bloating in the abdomen. Passage of more gas than usual.  Walking can help get rid of the air that was put into your GI tract during the procedure and reduce the bloating. If you had a lower endoscopy (such as a colonoscopy or flexible sigmoidoscopy) you may notice spotting of blood in your stool or on the toilet paper. If you underwent a bowel prep for your procedure, you may not have a normal bowel movement for a few days.  Please Note:  You might notice some irritation and congestion in your nose or some drainage.  This is from the oxygen used during your procedure.  There is no need for concern and it should clear up in a day or so.  SYMPTOMS TO REPORT IMMEDIATELY:   Following lower endoscopy (colonoscopy or flexible sigmoidoscopy):  Excessive amounts of blood in the stool  Significant tenderness or worsening of abdominal pains  Swelling of the abdomen that is new, acute  Fever of 100F or higher   Following upper endoscopy (EGD)  Vomiting of blood or coffee ground material  New chest pain or pain under the shoulder blades  Painful or persistently difficult  swallowing  New shortness of breath  Fever of 100F or higher  Black, tarry-looking stools  For urgent or emergent issues, a gastroenterologist can be reached at any hour by calling [phone].   DIET: Your first meal following the procedure should be a small meal and then it is ok to progress to your normal diet. Heavy or fried foods are harder to digest and may make you feel nauseous or bloated.  Likewise, meals heavy in dairy and vegetables can increase bloating.  Drink plenty of fluids but you should avoid alcoholic beverages for 24 hours.  ACTIVITY:  You should plan to take it easy for the rest of today and you should NOT DRIVE or use heavy machinery until tomorrow (because of the sedation medicines used during the test).    FOLLOW UP: Our staff will call the number listed on your records the next business day following your procedure to check on you and address any questions or concerns that you may have regarding the information given to you following your procedure. If we do not reach you, we will leave a message.  However, if you are feeling well and you are not experiencing any problems, there is no need to return our call.  We will assume that you have returned to your regular daily activities without incident.  If any biopsies were taken you will be contacted by phone or by letter within the next 1-3 weeks.  Please call us at [phone] if you have not heard  about the biopsies in 3 weeks.    SIGNATURES/CONFIDENTIALITY: You and/or your care partner have signed paperwork which will be entered into your electronic medical record.  These signatures attest to the fact that that the information above on your After Visit Summary has been reviewed and is understood.  Full responsibility of the confidentiality of this discharge information lies with you and/or your care-partner.

## 2015-04-01 NOTE — Progress Notes (Signed)
Called to room to assist during endoscopic procedure.  Patient ID and intended procedure confirmed with present staff. Received instructions for my participation in the procedure from the performing physician.  

## 2015-04-01 NOTE — Op Note (Signed)
[place]  [address]. [place], [address]   COLONOSCOPY PROCEDURE REPORT  PATIENT: Kevin, Evans  MR#: [id number] BIRTHDATE: 1970/09/17 , 44  yrs. old GENDER: male ENDOSCOPIST: Jerene Bears, MD REFERRED [id number] [place], M.D. PROCEDURE DATE:  04/01/2015 PROCEDURE:   Colonoscopy, diagnostic and Colonoscopy with biopsy First Screening Colonoscopy - Avg.  risk and is 50 yrs.  old or older - No.  Prior Negative Screening - Now for repeat screening. N/A  History of Adenoma - Now for follow-up colonoscopy & has been > or = to 3 yrs.  N/A  Polyps removed today? No Recommend repeat exam, <10 yrs? No ASA CLASS:   Class II INDICATIONS:chronic diarrhea and weight loss. MEDICATIONS: Monitored anesthesia care, Propofol 300 mg IV, and this was the total dose used for all procedures at this session  DESCRIPTION OF PROCEDURE:   After the risks benefits and alternatives of the procedure were thoroughly explained, informed consent was obtained.  The digital rectal exam revealed no rectal mass.   The LB [id number]  endoscope was introduced through the anus and advanced to the terminal ileum which was intubated for a short distance. No adverse events experienced.   The quality of the prep was excellent.  (Suprep was used)  The instrument was then slowly withdrawn as the colon was fully examined. Estimated blood loss is zero unless otherwise noted in this procedure report.   COLON FINDINGS: The examined terminal ileum appeared to be normal. A normal appearing cecum, ileocecal valve, and appendiceal orifice were identified.  The ascending, transverse, descending, sigmoid colon, and rectum appeared unremarkable.  Multiple random biopsies were performed using cold forceps.  Samples were sent to R/O microscopic colitis.  Retroflexed views revealed no abnormalities. The time to cecum = 3.4 Withdrawal time = 9.3   The scope was withdrawn and the procedure  completed. COMPLICATIONS: There were no immediate complications.  ENDOSCOPIC IMPRESSION: 1.   The examined terminal ileum appeared to be normal 2.   Normal colonoscopy; multiple random biopsies were performed using cold forceps  RECOMMENDATIONS: 1.  Await biopsy results 2.  Trial of Florastor 250 mg twice daily for 1 month, then office follow-up with me if symptoms of chronic loose stools continue  eSigned:  Jerene Bears, MD 04/01/2015 2:19 PM   cc:  the patient, Dr. Carlota Raspberry

## 2015-04-01 NOTE — Op Note (Signed)
[place]  [address]. [place], [address]   ENDOSCOPY PROCEDURE REPORT  PATIENT: Kevin Evans, Kevin Evans  MR#: [id number] BIRTHDATE: 02-08-1971 , 44  yrs. old GENDER: male ENDOSCOPIST: Jerene Bears, MD REFERRED BY:  [name], M.D. PROCEDURE DATE:  04/01/2015 PROCEDURE:  EGD, diagnostic and EGD w/ biopsy ASA CLASS:     Class II INDICATIONS:  chronic diarrhea and weight loss. MEDICATIONS: Monitored anesthesia care and Propofol 250 mg IV TOPICAL ANESTHETIC: none  DESCRIPTION OF PROCEDURE: After the risks benefits and alternatives of the procedure were thoroughly explained, informed consent was obtained.  The LB [id number] endoscope was introduced through the mouth and advanced to the second portion of the duodenum , Without limitations.  The instrument was slowly withdrawn as the mucosa was fully examined.      ESOPHAGUS: There was LA Class A reflux esophagitis noted.   A partial and nonobstructing Schatzki's ring was found at the gastroesophageal junction.  Otherwise normal esophagus.  STOMACH: Mild gastritis (inflammation) was found on the greater curvature of the gastric body.  There was scant adherent blood present.  Cold forcep biopsies were taken at the gastric body, antrum and angularis to evaluate for h.  pylori.  DUODENUM: The duodenal mucosa showed no abnormalities in the bulb and 2nd part of the duodenum.  Cold forcep biopsies were taken in the second portion.  Retroflexed views revealed a hiatal hernia.     The scope was then withdrawn from the patient and the procedure completed.  COMPLICATIONS: There were no immediate complications.  ENDOSCOPIC IMPRESSION: 1.   There was mild reflux esophagitis noted 2.   Partial and non-obstructing Schatzki's ring and small hiatal hernia was found at the gastroesophageal junction 3.   Mild gastritis (inflammation) was found on the greater curvature of the gastric body; multiple biopsies 4.   The  duodenal mucosa showed no abnormalities in the bulb and 2nd part of the duodenum cold forcep biopsies were taken in the second portion  RECOMMENDATIONS: 1.  Await biopsy results 2.  Proceed with a Colonoscopy.   eSigned:  Jerene Bears, MD 04/01/2015 2:10 PM    CC: the patient, PCP  PATIENT NAME:  Kevin Evans, Kevin Evans MR#: [id number]

## 2015-04-02 ENCOUNTER — Telehealth: Payer: Self-pay | Admitting: *Deleted

## 2015-04-02 NOTE — Telephone Encounter (Signed)
  Follow up Call-  Call back number 04/01/2015  Post procedure Call Back phone  # [phone]  Permission to leave phone message Yes     Patient questions:  Do you have a fever, pain , or abdominal swelling? No. Pain Score  0 *  Have you tolerated food without any problems? Yes.    Have you been able to return to your normal activities? Yes.    Do you have any questions about your discharge instructions: Diet   No. Medications  No. Follow up visit  No.  Do you have questions or concerns about your Care? No.  Actions: * If pain score is 4 or above: No action needed, pain <4.

## 2015-04-03 ENCOUNTER — Ambulatory Visit (INDEPENDENT_AMBULATORY_CARE_PROVIDER_SITE_OTHER): Payer: BLUE CROSS/BLUE SHIELD | Admitting: Family Medicine

## 2015-04-03 VITALS — BP 130/80 | HR 70 | Temp 98.3°F | Resp 20 | Ht 76.0 in | Wt 193.8 lb

## 2015-04-03 DIAGNOSIS — R350 Frequency of micturition: Secondary | ICD-10-CM | POA: Diagnosis not present

## 2015-04-03 DIAGNOSIS — E785 Hyperlipidemia, unspecified: Secondary | ICD-10-CM | POA: Diagnosis not present

## 2015-04-03 DIAGNOSIS — E1165 Type 2 diabetes mellitus with hyperglycemia: Secondary | ICD-10-CM

## 2015-04-03 DIAGNOSIS — E119 Type 2 diabetes mellitus without complications: Secondary | ICD-10-CM | POA: Diagnosis not present

## 2015-04-03 DIAGNOSIS — IMO0001 Reserved for inherently not codable concepts without codable children: Secondary | ICD-10-CM

## 2015-04-03 LAB — POCT CBC
Granulocyte percent: 25.5 %G — AB (ref 37–80)
HCT, POC: 44.6 % (ref 43.5–53.7)
Hemoglobin: 15.1 g/dL (ref 14.1–18.1)
Lymph, poc: 2.7 (ref 0.6–3.4)
MCH, POC: 26.8 pg — AB (ref 27–31.2)
MCHC: 33.8 g/dL (ref 31.8–35.4)
MCV: 79.4 fL — AB (ref 80–97)
MID (cbc): 0.3 (ref 0–0.9)
MPV: 7.1 fL (ref 0–99.8)
POC Granulocyte: 1 — AB (ref 2–6.9)
POC LYMPH PERCENT: 68 %L — AB (ref 10–50)
POC MID %: 6.5 % (ref 0–12)
Platelet Count, POC: 194 10*3/uL (ref 142–424)
RBC: 5.62 M/uL (ref 4.69–6.13)
RDW, POC: 12 %
WBC: 4 10*3/uL — AB (ref 4.6–10.2)

## 2015-04-03 LAB — POCT URINALYSIS DIP (MANUAL ENTRY)
Bilirubin, UA: NEGATIVE
Glucose, UA: 500 — AB
Leukocytes, UA: NEGATIVE
Nitrite, UA: NEGATIVE
Protein Ur, POC: 30 — AB
Spec Grav, UA: 1.025
Urobilinogen, UA: 0.2
pH, UA: 5.5

## 2015-04-03 LAB — POC MICROSCOPIC URINALYSIS (UMFC)
Amorphous: POSITIVE
Mucus: ABSENT

## 2015-04-03 LAB — POCT GLYCOSYLATED HEMOGLOBIN (HGB A1C): Hemoglobin A1C: 9.3

## 2015-04-03 MED ORDER — ATORVASTATIN CALCIUM 10 MG PO TABS: 10.0000 mg | ORAL_TABLET | Freq: Every day | ORAL | Status: DC

## 2015-04-03 MED ORDER — METFORMIN HCL 500 MG PO TABS: ORAL_TABLET | ORAL | Status: DC

## 2015-04-03 MED ORDER — SILDENAFIL CITRATE 50 MG PO TABS: ORAL_TABLET | ORAL | Status: DC

## 2015-04-03 NOTE — Progress Notes (Signed)
Chief Complaint:  Chief Complaint  Patient presents with  . Follow-up    pre- endoscopy    HPI: Kevin Evans is a 45 y.o. male who reports to [facility] today complaining of diabetes recheck. He recently had a EGD and colonscopy for diarrhea, EGD showed mild gastritis, shatzki's ring and hiatal hernia and coolnscopy was normal .  He is here for diabetes check up.+ polyuria. Denies neuropathy, CP, SOB, hypoglycemia, palpitations, presyncope/syncope,  polydipsia, nausea, vomiting, abd pain. He is taking metformin 500 mg BId but not watching his diet. He is a Administrator.  He is going to the [place] for 2 weeks and then 2 weeks in [place] for his wedding. He would like to know if he can have something to help with ED if he has issues on the night of his wedding. He also has HTN and HLD. No SEs on meds, he does not think his diarrhea is related to metformin.  Not utd on dental exam, utd on eye exam, utd on flu vaccine  Past Medical History  Diagnosis Date  . Diabetes mellitus without complication ([place])   . GERD (gastroesophageal reflux disease)   . HLD (hyperlipidemia)   . HTN (hypertension)    History reviewed. No pertinent past surgical history. Social History   Social History  . Marital Status: Unknown    Spouse Name: N/A  . Number of Children: 0  . Years of Education: N/A   Occupational History  . truck driver    Social History Main Topics  . Smoking status: Never Smoker   . Smokeless tobacco: Never Used  . Alcohol Use: No  . Drug Use: No  . Sexual Activity: Yes    Birth Control/ Protection: Condom   Other Topics Concern  . None   Social History Narrative   Family History  Problem Relation Age of Onset  . Hypertension Mother   . Stroke Mother   . Diabetes Paternal Aunt   . Diabetes Paternal Uncle   . Breast cancer Paternal Aunt    Allergies  Allergen Reactions  . Bactrim [Sulfamethoxazole-Trimethoprim] Other (See Comments)    Itching all over body    Prior  to Admission medications   Medication Sig Start Date End Date Taking? Authorizing Provider  atorvastatin (LIPITOR) 10 MG tablet Take 1 tablet (10 mg total) by mouth daily. 07/09/14  Yes Jaynee Eagles, PA-C  mefloquine (LARIAM) 250 MG tablet Take 1 tablet (250 mg total) by mouth every 7 (seven) days. 03/06/15  Yes Dorian Heckle English, PA  metFORMIN (GLUCOPHAGE) 500 MG tablet TAKE ONE TABLET BY MOUTH ONCE DAILY WITH BREAKFAST 12/29/14  Yes Chelle Jeffery, PA-C  pantoprazole (PROTONIX) 40 MG tablet Take 1 tablet (40 mg total) by mouth daily. 04/01/15  Yes Jerene Bears, MD     ROS: The patient denies fevers, chills, night sweats, unintentional weight loss, chest pain, palpitations, wheezing, dyspnea on exertion, nausea, vomiting, abdominal pain, dysuria, hematuria, melena, numbness, weakness, or tingling.   All other systems have been reviewed and were otherwise negative with the exception of those mentioned in the HPI and as above.    PHYSICAL EXAM: Filed Vitals:   04/03/15 1916  BP: 130/80  Pulse: 70  Temp: 98.3 F (36.8 C)  Resp: 20   Body mass index is 23.6 kg/(m^2).   General: Alert, no acute distress HEENT:  Normocephalic, atraumatic, oropharynx patent. EOMI, PERRLA Cardiovascular:  Regular rate and rhythm, no rubs murmurs or gallops.  No Carotid  bruits, radial pulse intact. No pedal edema.  Respiratory: Clear to auscultation bilaterally.  No wheezes, rales, or rhonchi.  No cyanosis, no use of accessory musculature Abdominal: No organomegaly, abdomen is soft and non-tender, positive bowel sounds. No masses. Skin: No rashes. Neurologic: Facial musculature symmetric. Psychiatric: Patient acts appropriately throughout our interaction. Lymphatic: No cervical or submandibular lymphadenopathy Musculoskeletal: Gait intact. No edema, tenderness  + Dp , PTA, micofilament exam normal    LABS: Results for orders placed or performed in visit on 04/03/15  POCT CBC  Result Value Ref Range   WBC  4.0 (A) 4.6 - 10.2 K/uL   Lymph, poc 2.7 0.6 - 3.4   POC LYMPH PERCENT 68.0 (A) 10 - 50 %L   MID (cbc) 0.3 0 - 0.9   POC MID % 6.5 0 - 12 %M   POC Granulocyte 1.0 (A) 2 - 6.9   Granulocyte percent 25.5 (A) 37 - 80 %G   RBC 5.62 4.69 - 6.13 M/uL   Hemoglobin 15.1 14.1 - 18.1 g/dL   HCT, POC 44.6 43.5 - 53.7 %   MCV 79.4 (A) 80 - 97 fL   MCH, POC 26.8 (A) 27 - 31.2 pg   MCHC 33.8 31.8 - 35.4 g/dL   RDW, POC 12.0 %   Platelet Count, POC 194 142 - 424 K/uL   MPV 7.1 0 - 99.8 fL  POCT urinalysis dipstick  Result Value Ref Range   Color, UA yellow yellow   Clarity, UA clear clear   Glucose, UA =500 (A) negative   Bilirubin, UA negative negative   Ketones, POC UA trace (5) (A) negative   Spec Grav, UA 1.025    Blood, UA small (A) negative   pH, UA 5.5    Protein Ur, POC =30 (A) negative   Urobilinogen, UA 0.2    Nitrite, UA Negative Negative   Leukocytes, UA Negative Negative  POCT Microscopic Urinalysis ([facility])  Result Value Ref Range   WBC,UR,HPF,POC None None WBC/hpf   RBC,UR,HPF,POC None None RBC/hpf   Bacteria None None, Too numerous to count   Mucus Absent Absent   Epithelial Cells, UR Per Microscopy Few (A) None, Too numerous to count cells/hpf   Amorphous positive   POCT glycosylated hemoglobin (Hb A1C)  Result Value Ref Range   Hemoglobin A1C 9.3    Lab Results  Component Value Date   HGBA1C 9.3 04/03/2015   HGBA1C 6.6 12/10/2014   HGBA1C 7.1 07/09/2014   Lab Results  Component Value Date   LDLCALC 148* 04/03/2014   CREATININE 0.90 12/10/2014     EKG/XRAY:   Primary read interpreted by Dr. Marin Comment at [facility].   ASSESSMENT/PLAN: Encounter Diagnoses  Name Primary?  . Controlled type 2 diabetes mellitus without complication, without long-term current use of insulin ([place]) Yes  . Urinary frequency   . Hyperlipidemia    Labs pending Increase metformin 500 mg to  1 gram BID, he needs to let me know if he has increase diarrhea with metformin Fu in 3 month after  wedding and honeymoon.  Recommend : ADA diet, BP goal <140/90, daily foot exams, tobacco cessation if smoking, annual eye exam, annual flu vaccine, PNA vaccine if age and time appropriate.     Gross sideeffects, risk and benefits, and alternatives of medications d/w patient. Patient is aware that all medications have potential sideeffects and we are unable to predict every sideeffect or drug-drug interaction that may occur.  Chevy Virgo DO  04/03/2015 9:21 PM

## 2015-04-03 NOTE — Patient Instructions (Signed)
Diabetes and Standards of Medical Care Diabetes is complicated. You may find that your diabetes team includes a dietitian, nurse, diabetes educator, eye doctor, and more. To help everyone know what is going on and to help you get the care you deserve, the following schedule of care was developed to help keep you on track. Below are the tests, exams, vaccines, medicines, education, and plans you will need. HbA1c test This test shows how well you have controlled your glucose over the past 2-3 months. It is used to see if your diabetes management plan needs to be adjusted.   It is performed at least 2 times a year if you are meeting treatment goals.  It is performed 4 times a year if therapy has changed or if you are not meeting treatment goals. Blood pressure test  This test is performed at every routine medical visit. The goal is less than 140/90 mm Hg for most people, but 130/80 mm Hg in some cases. Ask your health care provider about your goal. Dental exam  Follow up with the dentist regularly. Eye exam  If you are diagnosed with type 1 diabetes as a child, get an exam upon reaching the age of 80 years or older and having had diabetes for 3-5 years. Yearly eye exams are recommended after that initial eye exam.  If you are diagnosed with type 1 diabetes as an adult, get an exam within 5 years of diagnosis and then yearly.  If you are diagnosed with type 2 diabetes, get an exam as soon as possible after the diagnosis and then yearly. Foot care exam  Visual foot exams are performed at every routine medical visit. The exams check for cuts, injuries, or other problems with the feet.  You should have a complete foot exam performed every year. This exam includes an inspection of the structure and skin of your feet, a check of the pulses in your feet, and a check of the sensation in your feet.  Type 1 diabetes: The first exam is performed 5 years after diagnosis.  Type 2 diabetes: The first  exam is performed at the time of diagnosis.  Check your feet nightly for cuts, injuries, or other problems with your feet. Tell your health care provider if anything is not healing. Kidney function test (urine microalbumin)  This test is performed once a year.  Type 1 diabetes: The first test is performed 5 years after diagnosis.  Type 2 diabetes: The first test is performed at the time of diagnosis.  A serum creatinine and estimated glomerular filtration rate (eGFR) test is done once a year to assess the level of chronic kidney disease (CKD), if present. Lipid profile (cholesterol, HDL, LDL, triglycerides)  Performed every 5 years for most people.  The goal for LDL is less than 100 mg/dL. If you are at high risk, the goal is less than 70 mg/dL.  The goal for HDL is 40 mg/dL-50 mg/dL for men and 50 mg/dL-60 mg/dL for women. An HDL cholesterol of 60 mg/dL or higher gives some protection against heart disease.  The goal for triglycerides is less than 150 mg/dL. Immunizations  The flu (influenza) vaccine is recommended yearly for every person 30 months of age or older who has diabetes.  The pneumonia (pneumococcal) vaccine is recommended for every person 38 years of age or older who has diabetes. Adults 57 years of age or older may receive the pneumonia vaccine as a series of two separate shots.  The hepatitis B  vaccine is recommended for adults shortly after they have been diagnosed with diabetes.  The Tdap (tetanus, diphtheria, and pertussis) vaccine should be given:  According to normal childhood vaccination schedules, for children.  Every 10 years, for adults who have diabetes. Diabetes self-management education  Education is recommended at diagnosis and ongoing as needed. Treatment plan  Your treatment plan is reviewed at every medical visit.   This information is not intended to replace advice given to you by your health care provider. Make sure you discuss any questions you  have with your health care provider.   Document Released: 01/10/2009 Document Revised: 04/05/2014 Document Reviewed: 08/15/2012 Elsevier Interactive Patient Education 2016 Elsevier Inc.  

## 2015-04-04 LAB — LIPID PANEL
Cholesterol: 164 mg/dL (ref 125–200)
HDL: 37 mg/dL — ABNORMAL LOW (ref >=40)
LDL Cholesterol: 110 mg/dL (ref <130)
Total CHOL/HDL Ratio: 4.4 ratio (ref <=5.0)
Triglycerides: 84 mg/dL (ref <150)
VLDL: 17 mg/dL (ref <30)

## 2015-04-04 LAB — COMPLETE METABOLIC PANEL WITH GFR
Albumin: 4.6 g/dL (ref 3.6–5.1)
Alkaline Phosphatase: 73 U/L (ref 40–115)
Calcium: 9.5 mg/dL (ref 8.6–10.3)
GFR, Est African American: >89 mL/min (ref >=60)
GFR, Est Non African American: >89 mL/min (ref >=60)
Glucose, Bld: 220 mg/dL — ABNORMAL HIGH (ref 65–99)
Potassium: 3.6 mmol/L (ref 3.5–5.3)
Sodium: 138 mmol/L (ref 135–146)

## 2015-04-04 LAB — COMPLETE METABOLIC PANEL WITHOUT GFR
ALT: 32 U/L (ref 9–46)
AST: 24 U/L (ref 10–40)
BUN: 12 mg/dL (ref 7–25)
CO2: 29 mmol/L (ref 20–31)
Chloride: 101 mmol/L (ref 98–110)
Creat: 0.9 mg/dL (ref 0.60–1.35)
Total Bilirubin: 0.4 mg/dL (ref 0.2–1.2)
Total Protein: 8.1 g/dL (ref 6.1–8.1)

## 2015-04-04 LAB — MICROALBUMIN, URINE: Microalb, Ur: 4.3 mg/dL

## 2015-04-08 ENCOUNTER — Encounter: Payer: Self-pay | Admitting: Internal Medicine

## 2015-04-08 DIAGNOSIS — E119 Type 2 diabetes mellitus without complications: Secondary | ICD-10-CM | POA: Insufficient documentation

## 2015-04-14 MED ORDER — METFORMIN HCL 500 MG PO TABS: ORAL_TABLET | ORAL | Status: DC

## 2015-04-14 NOTE — Telephone Encounter (Signed)
Pt LM over the weekend asking for 90 day supply of med. Called him back and verified that it is the metformin that needs to be changed to 90 days to cover him while travelling [place]. I sent new Rx.

## 2015-04-15 ENCOUNTER — Telehealth: Payer: Self-pay | Admitting: Internal Medicine

## 2015-04-15 DIAGNOSIS — R197 Diarrhea, unspecified: Secondary | ICD-10-CM

## 2015-04-15 DIAGNOSIS — K219 Gastro-esophageal reflux disease without esophagitis: Secondary | ICD-10-CM

## 2015-04-15 DIAGNOSIS — K529 Noninfective gastroenteritis and colitis, unspecified: Secondary | ICD-10-CM

## 2015-04-15 DIAGNOSIS — R634 Abnormal weight loss: Secondary | ICD-10-CM

## 2015-04-15 DIAGNOSIS — K2970 Gastritis, unspecified, without bleeding: Secondary | ICD-10-CM

## 2015-04-15 MED ORDER — PANTOPRAZOLE SODIUM 40 MG PO TBEC: 40.0000 mg | DELAYED_RELEASE_TABLET | Freq: Every day | ORAL | Status: DC

## 2015-04-15 NOTE — Telephone Encounter (Signed)
Rx sent 

## 2015-04-18 ENCOUNTER — Telehealth: Payer: Self-pay

## 2015-04-18 NOTE — Telephone Encounter (Signed)
Advised Rx was sent on 04/14/15 for 90 days worth on VM.

## 2015-04-18 NOTE — Telephone Encounter (Signed)
Requesting metformin to go on vacation starting Monday, Jan 23.   [facility] on [address]   [phone]

## 2015-04-19 ENCOUNTER — Telehealth: Payer: Self-pay | Admitting: Family Medicine

## 2015-04-19 DIAGNOSIS — E1165 Type 2 diabetes mellitus with hyperglycemia: Principal | ICD-10-CM

## 2015-04-19 DIAGNOSIS — IMO0001 Reserved for inherently not codable concepts without codable children: Secondary | ICD-10-CM

## 2015-04-19 MED ORDER — METFORMIN HCL 500 MG PO TABS: ORAL_TABLET | ORAL | Status: DC

## 2015-04-19 NOTE — Telephone Encounter (Signed)
Patient came in office regarding Metformin rx. States the pharmacy did not have rx. It was supposed to be sent to [facility]. It was sent to [facility]. Called [facility] and cxl'd 3 month supply that was sent in on 04/14/2015. Sent a new rx to [facility] for a 2 month supply. He stated he did not not need a 3 month supply because he would be returning on March 30, from out of [place]. Advised him if there was any problems with them filling this rx have the pharmacy call office. Patient voiced understanding.

## 2015-06-12 ENCOUNTER — Encounter: Payer: Self-pay | Admitting: Family Medicine

## 2015-07-01 ENCOUNTER — Ambulatory Visit (INDEPENDENT_AMBULATORY_CARE_PROVIDER_SITE_OTHER): Payer: BLUE CROSS/BLUE SHIELD | Admitting: Physician Assistant

## 2015-07-01 VITALS — BP 134/70 | HR 60 | Temp 98.2°F | Resp 16 | Ht 76.0 in | Wt 190.0 lb

## 2015-07-01 DIAGNOSIS — E119 Type 2 diabetes mellitus without complications: Secondary | ICD-10-CM

## 2015-07-01 DIAGNOSIS — E1165 Type 2 diabetes mellitus with hyperglycemia: Secondary | ICD-10-CM

## 2015-07-01 DIAGNOSIS — IMO0001 Reserved for inherently not codable concepts without codable children: Secondary | ICD-10-CM

## 2015-07-01 DIAGNOSIS — Z23 Encounter for immunization: Secondary | ICD-10-CM | POA: Diagnosis not present

## 2015-07-01 LAB — POCT GLYCOSYLATED HEMOGLOBIN (HGB A1C): Hemoglobin A1C: 7.6

## 2015-07-01 LAB — GLUCOSE, POCT (MANUAL RESULT ENTRY): POC Glucose: 82 mg/dl (ref 70–99)

## 2015-07-01 MED ORDER — METFORMIN HCL 1000 MG PO TABS: 1000.0000 mg | ORAL_TABLET | Freq: Two times a day (BID) | ORAL | Status: DC

## 2015-07-01 NOTE — Patient Instructions (Addendum)
Make your eye exam appt. Stop drinking sodas altogether. No juices. Only water for drinking. You keep eating rice as long as you can stop the sugary beverages completely. Return in 3 months for follow up.    IF you received an x-ray today, you will receive an invoice from [facility]. Please contact [facility] at [phone] with questions or concerns regarding your invoice.   IF you received labwork today, you will receive an invoice from [employer]. Please contact [facility] at [phone] with questions or concerns regarding your invoice.   Our billing staff will not be able to assist you with questions regarding bills from these companies.  You will be contacted with the lab results as soon as they are available. The fastest way to get your results is to activate your My Chart account. Instructions are located on the last page of this paperwork. If you have not heard from [place] regarding the results in 2 weeks, please contact this office.     Pneumococcal Vaccine, Polyvalent solution for injection What is this medicine? PNEUMOCOCCAL VACCINE, POLYVALENT (NEU mo KOK al vak SEEN, pol ee VEY luhnt) is a vaccine to prevent pneumococcus bacteria infection. These bacteria are a major cause of ear infections, Strep throat infections, and serious pneumonia, meningitis, or blood infections worldwide. These vaccines help the body to produce antibodies (protective substances) that help your body defend against these bacteria. This vaccine is recommended for people 67 years of age and older with health problems. It is also recommended for all adults over 16 years old. This vaccine will not treat an infection. This medicine may be used for other purposes; ask your health care provider or pharmacist if you have questions. What should I tell my health care provider before I take this medicine? They need to know if you have any of these conditions: -bleeding  problems -bone marrow or organ transplant -cancer, Hodgkin's disease -fever -infection -immune system problems -low platelet count in the blood -seizures -an unusual or allergic reaction to pneumococcal vaccine, diphtheria toxoid, other vaccines, latex, other medicines, foods, dyes, or preservatives -pregnant or trying to get pregnant -breast-feeding How should I use this medicine? This vaccine is for injection into a muscle or under the skin. It is given by a health care professional. A copy of Vaccine Information Statements will be given before each vaccination. Read this sheet carefully each time. The sheet may change frequently. Talk to your pediatrician regarding the use of this medicine in children. While this drug may be prescribed for children as young as 57 years of age for selected conditions, precautions do apply. Overdosage: If you think you have taken too much of this medicine contact a poison control center or emergency room at once. NOTE: This medicine is only for you. Do not share this medicine with others. What if I miss a dose? It is important not to miss your dose. Call your doctor or health care professional if you are unable to keep an appointment. What may interact with this medicine? -medicines for cancer chemotherapy -medicines that suppress your immune function -medicines that treat or prevent blood clots like warfarin, enoxaparin, and dalteparin -steroid medicines like prednisone or cortisone This list may not describe all possible interactions. Give your health care provider a list of all the medicines, herbs, non-prescription drugs, or dietary supplements you use. Also tell them if you smoke, drink alcohol, or use illegal drugs. Some items may interact with your medicine. What should I watch for  while using this medicine? Mild fever and pain should go away in 3 days or less. Report any unusual symptoms to your doctor or health care professional. What side effects  may I notice from receiving this medicine? Side effects that you should report to your doctor or health care professional as soon as possible: -allergic reactions like skin rash, itching or hives, swelling of the face, lips, or tongue -breathing problems -confused -fever over 102 degrees F -pain, tingling, numbness in the hands or feet -seizures -unusual bleeding or bruising -unusual muscle weakness Side effects that usually do not require medical attention (report to your doctor or health care professional if they continue or are bothersome): -aches and pains -diarrhea -fever of 102 degrees F or less -headache -irritable -loss of appetite -pain, tender at site where injected -trouble sleeping This list may not describe all possible side effects. Call your doctor for medical advice about side effects. You may report side effects to FDA at 1-800-FDA-1088. Where should I keep my medicine? This does not apply. This vaccine is given in a clinic, pharmacy, doctor's office, or other health care setting and will not be stored at home. NOTE: This sheet is a summary. It may not cover all possible information. If you have questions about this medicine, talk to your doctor, pharmacist, or health care provider.    2016, Elsevier/Gold Standard. (2007-10-20 14:32:37)

## 2015-07-01 NOTE — Progress Notes (Signed)
[facility] [address], [place] [address] [phone]  Date:  07/01/2015   Name:  Kevin Evans   DOB:  [birth year]   MRN:  [id number]  PCP:  Wendie Agreste, MD    Chief Complaint: Follow-up   History of Present Illness:  This is a 45 y.o. male with PMH HLD, HTN who is presenting for DM follow up. He was last seen 3 months ago by Dr. Marin Comment. At that time A1C was 9.3. His metformin was increased from 500 mg BID to 1 gm BID. Has not been checking sugars. He was in [place] for vacation and his wedding.  States he occ gets an "ache" in his left foot. No burning or tingling. Denies cp, sob. Last eye exam 1 year ago. Due for next eye exam. 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 Has a soda every 2-3 days. Mostly drinks water during the day. States he has a very hard time staying away from bread and rice, states he grew up eating rice and rice is very important to him. Never had pneumonia vaccine.  Was having problems with diarrhea for the past 1 year. Had a colonoscopy and endoscopy in January. Colonoscopy was normal. EGD with gastritis and hiatal hernia. No reason for diarrhea discovered. He states out of the blue the diarrhea stopped 1 month ago. No longer with any problems.  Pt wanting ED removed from his problem list. He states it was just his wedding night that he thought he might need help with but does not have ED on a regular basis. Never bought the viagra that was prescribed to him d/t cost.  Review of Systems:  Review of Systems See HPI  Patient Active Problem List   Diagnosis Date Noted  . Uncontrolled type 2 diabetes mellitus without complication, without long-term current use of insulin ([place]) 04/08/2015    Prior to Admission medications   Medication Sig Start Date End Date Taking? Authorizing Provider  atorvastatin (LIPITOR) 10 MG tablet Take 1 tablet (10 mg total) by mouth daily. 04/03/15  Yes Thao P  Le, DO  metFORMIN (GLUCOPHAGE) 500 MG tablet TAKE TWO TABLETS  BY MOUTH TWICE DAILY WITH BREAKFAST 04/19/15  Yes Thao P Le, DO  pantoprazole (PROTONIX) 40 MG tablet Take 1 tablet (40 mg total) by mouth daily. 04/15/15   Jerene Bears, MD  sildenafil (VIAGRA) 50 MG tablet Take 1-2 tabs 30 min before sex Patient not taking: Reported on 07/01/2015 04/03/15   Thao P Le, DO    Allergies  Allergen Reactions  . Bactrim [Sulfamethoxazole-Trimethoprim] Other (See Comments)    Itching all over body     History reviewed. No pertinent past surgical history.  Social History  Substance Use Topics  . Smoking status: Never Smoker   . Smokeless tobacco: Never Used  . Alcohol Use: No    Family History  Problem Relation Age of Onset  . Hypertension Mother   . Stroke Mother   . Diabetes Paternal Aunt   . Diabetes Paternal Uncle   . Breast cancer Paternal Aunt     Medication list has been reviewed and updated.  Physical Examination:  Physical Exam  Constitutional: He is oriented to person, place, and time. He appears well-developed and well-nourished. No distress.  HENT:  Head: Normocephalic and atraumatic.  Right Ear: Hearing normal.  Left Ear: Hearing normal.  Nose: Nose normal.  Eyes: Conjunctivae and lids  are normal. Right eye exhibits no discharge. Left eye exhibits no discharge. No scleral icterus.  Neck: Carotid bruit is not present.  Cardiovascular: Normal rate, regular rhythm, normal heart sounds and normal pulses.   No murmur heard. Pulmonary/Chest: Effort normal and breath sounds normal. No respiratory distress. He has no wheezes. He has no rhonchi. He has no rales.  Musculoskeletal: Normal range of motion.  Lymphadenopathy:       Head (right side): No submental, no submandibular and no tonsillar adenopathy present.       Head (left side): No submental, no submandibular and no tonsillar adenopathy present.    He has no cervical adenopathy.  Neurological: He is alert and oriented to  person, place, and time.  Diabetic Foot Exam - Simple Visual Inspection  No deformities, no ulcerations, no other skin breakdown bilaterally:  Yes  Sensation Testing  Intact to touch and monofilament testing bilaterally:  Yes  Pulse Check  Posterior Tibialis and Dorsalis pulse intact bilaterally:  Yes      Skin: Skin is warm, dry and intact. No lesion and no rash noted.  Psychiatric: He has a normal mood and affect. His speech is normal and behavior is normal. Thought content normal.   BP 134/70 mmHg  Pulse 60  Temp(Src) 98.2 F (36.8 C)  Resp 16  Ht 6\' 4"  (1.93 m)  Wt 190 lb (86.183 kg)  BMI 23.14 kg/m2  SpO2 97%  Results for orders placed or performed in visit on 07/01/15  POCT glucose (manual entry)  Result Value Ref Range   POC Glucose 82 70 - 99 mg/dl  POCT glycosylated hemoglobin (Hb A1C)  Result Value Ref Range   Hemoglobin A1C 7.6    Assessment and Plan:  1. Uncontrolled type 2 diabetes mellitus without complication, without long-term current use of insulin ([facility]) A1C much improved from 9.3 three months ago to 7.6 today. A1C not at goal but we discussed at length lifestyle changes to improve his A1C -- exercise, eliminating sugary beverages. Pt does not want to give up rice which is very culturally important to him. Advised he start taking aspirin 81 mg daily. Advised make appt for eye exam. Return in 3 months for follow up. - CBC - Comprehensive metabolic panel - POCT glucose (manual entry) - POCT glycosylated hemoglobin (Hb A1C) - metFORMIN (GLUCOPHAGE) 1000 MG tablet; Take 1 tablet (1,000 mg total) by mouth 2 (two) times daily with a meal.  Dispense: 180 tablet; Refill: 1  2. Encounter for diabetic foot exam ([place]) Normal pulses and monofilament testing.  3. Need for pneumococcal vaccination - Pneumococcal polysaccharide vaccine 23-valent greater than or equal to 2yo subcutaneous/IM   Benjaman Pott. Drenda Freeze, MHS [facility]  07/01/2015

## 2015-07-02 LAB — CBC
HEMATOCRIT: 43.4 % (ref 38.5–50.0)
Hemoglobin: 14.7 g/dL (ref 13.2–17.1)
MCH: 26.9 pg — AB (ref 27.0–33.0)
MCHC: 33.9 g/dL (ref 32.0–36.0)
MCV: 79.3 fL — AB (ref 80.0–100.0)
MPV: 9.7 fL (ref 7.5–12.5)
Platelets: 227 10*3/uL (ref 140–400)
RBC: 5.47 MIL/uL (ref 4.20–5.80)
RDW: 14.2 % (ref 11.0–15.0)
WBC: 3.3 10*3/uL — ABNORMAL LOW (ref 3.8–10.8)

## 2015-07-02 LAB — COMPREHENSIVE METABOLIC PANEL
ALK PHOS: 46 U/L (ref 40–115)
ALT: 23 U/L (ref 9–46)
AST: 24 U/L (ref 10–40)
Albumin: 4.6 g/dL (ref 3.6–5.1)
BUN: 15 mg/dL (ref 7–25)
CALCIUM: 10.2 mg/dL (ref 8.6–10.3)
CO2: 27 mmol/L (ref 20–31)
Chloride: 101 mmol/L (ref 98–110)
Creat: 0.86 mg/dL (ref 0.60–1.35)
Glucose, Bld: 72 mg/dL (ref 65–99)
POTASSIUM: 4.4 mmol/L (ref 3.5–5.3)
Sodium: 140 mmol/L (ref 135–146)
TOTAL PROTEIN: 7.8 g/dL (ref 6.1–8.1)
Total Bilirubin: 0.4 mg/dL (ref 0.2–1.2)

## 2015-07-04 ENCOUNTER — Other Ambulatory Visit: Payer: Self-pay

## 2015-07-04 DIAGNOSIS — E119 Type 2 diabetes mellitus without complications: Secondary | ICD-10-CM

## 2015-07-04 MED ORDER — BLOOD GLUCOSE MONITORING SUPPL W/DEVICE KIT: PACK | Status: AC

## 2015-09-28 ENCOUNTER — Other Ambulatory Visit: Payer: Self-pay | Admitting: Internal Medicine

## 2015-11-01 LAB — HM DIABETES EYE EXAM

## 2015-12-10 ENCOUNTER — Encounter: Payer: Self-pay | Admitting: Family Medicine

## 2015-12-10 ENCOUNTER — Ambulatory Visit (INDEPENDENT_AMBULATORY_CARE_PROVIDER_SITE_OTHER): Payer: BLUE CROSS/BLUE SHIELD | Admitting: Urgent Care

## 2015-12-10 VITALS — BP 106/74 | HR 70 | Temp 98.1°F | Resp 16 | Ht 75.5 in | Wt 195.6 lb

## 2015-12-10 DIAGNOSIS — E119 Type 2 diabetes mellitus without complications: Secondary | ICD-10-CM

## 2015-12-10 DIAGNOSIS — Z23 Encounter for immunization: Secondary | ICD-10-CM | POA: Diagnosis not present

## 2015-12-10 DIAGNOSIS — R21 Rash and other nonspecific skin eruption: Secondary | ICD-10-CM | POA: Diagnosis not present

## 2015-12-10 LAB — POCT SKIN KOH: SKIN KOH, POC: NEGATIVE

## 2015-12-10 LAB — POCT GLYCOSYLATED HEMOGLOBIN (HGB A1C): Hemoglobin A1C: 6.5

## 2015-12-10 MED ORDER — TRIAMCINOLONE ACETONIDE 0.1 % EX CREA: 1.0000 "application " | TOPICAL_CREAM | Freq: Two times a day (BID) | CUTANEOUS | 0 refills | Status: DC

## 2015-12-10 MED ORDER — METFORMIN HCL 1000 MG PO TABS: 1000.0000 mg | ORAL_TABLET | Freq: Two times a day (BID) | ORAL | 3 refills | Status: DC

## 2015-12-10 NOTE — Patient Instructions (Addendum)
Diabetes Mellitus and Food It is important for you to manage your blood sugar (glucose) level. Your blood glucose level can be greatly affected by what you eat. Eating healthier foods in the appropriate amounts throughout the day at about the same time each day will help you control your blood glucose level. It can also help slow or prevent worsening of your diabetes mellitus. Healthy eating may even help you improve the level of your blood pressure and reach or maintain a healthy weight.  General recommendations for healthful eating and cooking habits include:  Eating meals and snacks regularly. Avoid going long periods of time without eating to lose weight.  Eating a diet that consists mainly of plant-based foods, such as fruits, vegetables, nuts, legumes, and whole grains.  Using low-heat cooking methods, such as baking, instead of high-heat cooking methods, such as deep frying. Work with your dietitian to make sure you understand how to use the Nutrition Facts information on food labels. HOW CAN FOOD AFFECT ME? Carbohydrates Carbohydrates affect your blood glucose level more than any other type of food. Your dietitian will help you determine how many carbohydrates to eat at each meal and teach you how to count carbohydrates. Counting carbohydrates is important to keep your blood glucose at a healthy level, especially if you are using insulin or taking certain medicines for diabetes mellitus. Alcohol Alcohol can cause sudden decreases in blood glucose (hypoglycemia), especially if you use insulin or take certain medicines for diabetes mellitus. Hypoglycemia can be a life-threatening condition. Symptoms of hypoglycemia (sleepiness, dizziness, and disorientation) are similar to symptoms of having too much alcohol.  If your health care provider has given you approval to drink alcohol, do so in moderation and use the following guidelines:  Women should not have more than one drink per day, and men  should not have more than two drinks per day. One drink is equal to:  12 oz of beer.  5 oz of wine.  1 oz of hard liquor.  Do not drink on an empty stomach.  Keep yourself hydrated. Have water, diet soda, or unsweetened iced tea.  Regular soda, juice, and other mixers might contain a lot of carbohydrates and should be counted. WHAT FOODS ARE NOT RECOMMENDED? As you make food choices, it is important to remember that all foods are not the same. Some foods have fewer nutrients per serving than other foods, even though they might have the same number of calories or carbohydrates. It is difficult to get your body what it needs when you eat foods with fewer nutrients. Examples of foods that you should avoid that are high in calories and carbohydrates but low in nutrients include:  Trans fats (most processed foods list trans fats on the Nutrition Facts label).  Regular soda.  Juice.  Candy.  Sweets, such as cake, pie, doughnuts, and cookies.  Fried foods. WHAT FOODS CAN I EAT? Eat nutrient-rich foods, which will nourish your body and keep you healthy. The food you should eat also will depend on several factors, including:  The calories you need.  The medicines you take.  Your weight.  Your blood glucose level.  Your blood pressure level.  Your cholesterol level. You should eat a variety of foods, including:  Protein.  Lean cuts of meat.  Proteins low in saturated fats, such as fish, egg whites, and beans. Avoid processed meats.  Fruits and vegetables.  Fruits and vegetables that may help control blood glucose levels, such as apples, mangoes, and   yams.  Dairy products.  Choose fat-free or low-fat dairy products, such as milk, yogurt, and cheese.  Grains, bread, pasta, and rice.  Choose whole grain products, such as multigrain bread, whole oats, and brown rice. These foods may help control blood pressure.  Fats.  Foods containing healthful fats, such as nuts,  avocado, olive oil, canola oil, and fish. DOES EVERYONE WITH DIABETES MELLITUS HAVE THE SAME MEAL PLAN? Because every person with diabetes mellitus is different, there is not one meal plan that works for everyone. It is very important that you meet with a dietitian who will help you create a meal plan that is just right for you.   This information is not intended to replace advice given to you by your health care provider. Make sure you discuss any questions you have with your health care provider.   Document Released: 12/10/2004 Document Revised: 04/05/2014 Document Reviewed: 02/09/2013 Elsevier Interactive Patient Education 2016 [employer].     IF you received an x-ray today, you will receive an invoice from [facility]. Please contact [facility] at [phone] with questions or concerns regarding your invoice.   IF you received labwork today, you will receive an invoice from [employer]. Please contact [facility] at [phone] with questions or concerns regarding your invoice.   Our billing staff will not be able to assist you with questions regarding bills from these companies.  You will be contacted with the lab results as soon as they are available. The fastest way to get your results is to activate your My Chart account. Instructions are located on the last page of this paperwork. If you have not heard from [place] regarding the results in 2 weeks, please contact this office.

## 2015-12-10 NOTE — Progress Notes (Signed)
MRN: [id number] DOB: 1971/02/24  Subjective:   Kevin Evans is a 45 y.o. male presenting for chief complaint of Follow-up (DIABETES)  Managed with metformin 1,000mg  BID. Checks blood sugar at home, fasting levels are between 100-130. Eats diabetic friendly diet. Still drinks sodas but does diet sodas. Patient does not exercise. 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 Had a diabetic eye exam, does not have diabetic retinopathy. Denies  Dizziness, polydipsia, polyuria, blurred vision, chest pain, shortness of breath, nausea, vomiting, abdominal pain, hematuria, numbness or tingling. Denies foot/skin infections. He does report ~1 week history of rash over his left shoulder. Has tried lotion to help with his symptoms. Denies fever, itching, pain, drainage from the rash.   Kevin Evans has a current medication list which includes the following prescription(s): atorvastatin, metformin, pantoprazole, and blood glucose monitoring suppl. Also is allergic to bactrim [sulfamethoxazole-trimethoprim].  Kevin Evans  has a past medical history of Diabetes mellitus without complication ([place]); GERD (gastroesophageal reflux disease); HLD (hyperlipidemia); and HTN (hypertension). Also  has no past surgical history on file.  Objective:   Vitals: BP 106/74 (BP Location: Right Arm, Patient Position: Sitting, Cuff Size: Large)   Pulse 70   Temp 98.1 F (36.7 C) (Oral)   Resp 16   Ht 6' 3.5" (1.918 m)   Wt 195 lb 9.6 oz (88.7 kg)   SpO2 98%   BMI 24.13 kg/m   Physical Exam  Constitutional: He is oriented to person, place, and time. He appears well-developed and well-nourished.  HENT:  Mouth/Throat: Oropharynx is clear and moist.  Eyes: No scleral icterus.  Cardiovascular: Normal rate, regular rhythm and intact distal pulses.  Exam reveals no gallop and no friction rub.   No murmur heard. Pulmonary/Chest: No respiratory distress. He has no wheezes. He has no rales.  Abdominal: Soft. Bowel sounds  are normal. He exhibits no distension and no mass. There is no tenderness. There is no guarding.  Musculoskeletal: He exhibits no edema.  Neurological: He is alert and oriented to person, place, and time.  Skin: Skin is warm and dry. Rash (dry scaly, non-pruritic, non-tender rash over left shoulder) noted.   Diabetic Foot Exam - Simple   Simple Foot Form Diabetic Foot exam was performed with the following findings:  Yes 12/10/2015  2:27 PM  Visual Inspection Sensation Testing Pulse Check Comments Normal exam     Results for orders placed or performed in visit on 12/10/15 (from the past 24 hour(s))  POCT glycosylated hemoglobin (Hb A1C)     Status: None   Collection Time: 12/10/15  2:27 PM  Result Value Ref Range   Hemoglobin A1C 6.5   POCT Skin KOH     Status: None   Collection Time: 12/10/15  2:34 PM  Result Value Ref Range   Skin KOH, POC Negative     Assessment and Plan :   1. Controlled type 2 diabetes mellitus without complication, without long-term current use of insulin ([place]) - Well controlled, continue current regimen. Patient admits that he will be traveling to [place] soon and he does not eat healthily there. I recommended he come for recheck thereafter. Patient agreed.  2. Rash and nonspecific skin eruption - Start triamcinolone BID for 1 week, rtc if no improvement. Skin KOH was negative.  3. Need for prophylactic vaccination and inoculation against influenza - Flu Vaccine QUAD 36+ mos PF IM (Fluarix & Fluzone Quad PF)   Jaynee Eagles, PA-C [facility] [phone] 12/10/2015 2:10 PM

## 2015-12-12 ENCOUNTER — Ambulatory Visit: Payer: BLUE CROSS/BLUE SHIELD

## 2016-01-19 ENCOUNTER — Other Ambulatory Visit: Payer: Self-pay | Admitting: Internal Medicine

## 2016-01-20 ENCOUNTER — Other Ambulatory Visit: Payer: Self-pay

## 2016-01-20 MED ORDER — ATORVASTATIN CALCIUM 10 MG PO TABS: 10.0000 mg | ORAL_TABLET | Freq: Every day | ORAL | 0 refills | Status: DC

## 2016-01-20 NOTE — Telephone Encounter (Signed)
12/10/2015 last ov and labs 03/2015 last lipid

## 2016-01-28 ENCOUNTER — Ambulatory Visit (INDEPENDENT_AMBULATORY_CARE_PROVIDER_SITE_OTHER): Payer: BLUE CROSS/BLUE SHIELD | Admitting: Family Medicine

## 2016-01-28 VITALS — BP 108/64 | HR 65 | Temp 100.0°F | Resp 16 | Ht 75.5 in | Wt 197.0 lb

## 2016-01-28 DIAGNOSIS — H9191 Unspecified hearing loss, right ear: Secondary | ICD-10-CM | POA: Diagnosis not present

## 2016-01-28 DIAGNOSIS — H6240 Otitis externa in other diseases classified elsewhere, unspecified ear: Secondary | ICD-10-CM

## 2016-01-28 DIAGNOSIS — B369 Superficial mycosis, unspecified: Secondary | ICD-10-CM

## 2016-01-28 MED ORDER — CLOTRIMAZOLE 1 % EX SOLN: 1.0000 "application " | Freq: Two times a day (BID) | CUTANEOUS | 0 refills | Status: DC

## 2016-01-28 NOTE — Progress Notes (Signed)
By signing my name below, I, [name], attest that this documentation has been prepared under the direction and in the presence of [name], MD.  Electronically Signed: [name], Medical Scribe. 01/28/16. 6:06 PM.  Subjective:    Patient ID: Kevin Evans, male    DOB: 05-29-1970, 45 y.o.   MRN: [id number]  HPI Chief Complaint  Patient presents with  . Otalgia    right ear drainage x 2 weeks    HPI Comments: Kevin Evans is a 45 y.o. male with a PMHx of DM who [facility] the [facility] complaining of right otalgia and ear drainage onset 2 weeks ago. DM was controlled at last visit Sept 13th with A1c of 6.5. Noted reviewed from last visit with [name], PA-C.  Reports initial symptoms of intermittent otalgia for 2 weeks but has been consistent for the past 2-3 days, muffled hearing, and itching in bilateral ears R>L, and ear drainage. Pt scratched his ear 2 weeks ago with blood on his q-tip and he wears his ear buds daily. Used homeopathic OTC ear release drop for no relief of his symptoms. Pt reports experiencing cerumen drainage his whole life and uses q-tip to clean his ears. Denies fever, congestion, and rhinorrhea.  There are no active problems to display for this patient.  Past Medical History:  Diagnosis Date  . Diabetes mellitus without complication ([place])   . GERD (gastroesophageal reflux disease)   . HLD (hyperlipidemia)   . HTN (hypertension)    No past surgical history on file. Allergies  Allergen Reactions  . Bactrim [Sulfamethoxazole-Trimethoprim] Other (See Comments)    Itching all over body    Prior to Admission medications   Medication Sig Start Date End Date Taking? Authorizing Provider  atorvastatin (LIPITOR) 10 MG tablet Take 1 tablet (10 mg total) by mouth daily. 01/20/16   Chelle Jeffery, PA-C  Blood Glucose Monitoring Suppl w/Device KIT Monitor glucose twice daily 07/04/15   Ezekiel Slocumb, PA-C  metFORMIN (GLUCOPHAGE) 1000  MG tablet Take 1 tablet (1,000 mg total) by mouth 2 (two) times daily with a meal. 12/10/15   Jaynee Eagles, PA-C  pantoprazole (PROTONIX) 40 MG tablet TAKE 1 TABLET BY MOUTH DAILY 01/19/16   Jerene Bears, MD  triamcinolone cream (KENALOG) 0.1 % Apply 1 application topically 2 (two) times daily. 12/10/15   Jaynee Eagles, PA-C   Social History   Social History  . Marital status: Single    Spouse name: N/A  . Number of children: 0  . Years of education: N/A   Occupational History  . truck driver    Social History Main Topics  . Smoking status: Never Smoker  . Smokeless tobacco: Never Used  . Alcohol use No  . Drug use: No  . Sexual activity: Yes    Birth control/ protection: Condom   Other Topics Concern  . Not on file   Social History Narrative  . No narrative on file   Review of Systems  Constitutional: Negative for fever.  HENT: Positive for ear discharge, ear pain and hearing loss (muffled). Negative for congestion and rhinorrhea.    Objective:  Physical Exam  Constitutional: He appears well-developed and well-nourished. No distress.  HENT:  Head: Normocephalic and atraumatic.  Dull TM on left with some irritation on the canal Right canal unable to visualize TM; cerumen mixed with what appears to be white to light yellow substance with small dark yellow round substance on top of the  surface Few similar round dark areas on the left cannal Mastoid non tender Slight canal edema on the right  Eyes: Conjunctivae are normal.  Neck: Neck supple.  Cardiovascular: Normal rate.   Pulmonary/Chest: Effort normal.  Neurological: He is alert.  Skin: Skin is warm and dry.  Psychiatric: He has a normal mood and affect. His behavior is normal.  Nursing note and vitals reviewed.  BP 108/64 (BP Location: Right Arm, Patient Position: Sitting, Cuff Size: Large)   Pulse 65   Temp 100 F (37.8 C) (Oral)   Resp 16   Ht 6' 3.5" (1.918 m)   Wt 197 lb (89.4 kg)   SpO2 100%   BMI 24.30 kg/m   Assessment & Plan:    [name] is a 45 y.o. male Decreased hearing of right ear - Plan: Ambulatory referral to ENT, clotrimazole (LOTRIMIN) 1 % external solution  Otomycosis - Plan: Ambulatory referral to ENT, clotrimazole (LOTRIMIN) 1 % external solution  Based on previous symptoms of itching and irritation, and frequent cotton tipped swab use, suspect fungal component or otomycosis. Right canal appears to be significantly occluded with fungal elements/exudate. Also with decreased hearing and discomfort on that side. We'll start clotrimazole solution 5 drops twice a day, and can also use on left if needed. However will refer to ear nose and throat either tomorrow or Friday as debris and fungal elements need to be removed likely with binocular magnification.   Meds ordered this encounter  Medications  . clotrimazole (LOTRIMIN) 1 % external solution    Sig: Apply 1 application topically 2 (two) times daily. (5 drops to R ear BID)    Dispense:  30 mL    Refill:  0   Patient Instructions    Start the antifungal drops as your ear infection appears to be due to fungus. However I will also refer you to Ear, Nose and Throat specialist as the fungus and other substance within the canal typically needs to be cleaned out with the use of a microscope by ear nose and throat. We should be referring you either tomorrow or Friday.  Return to the clinic or go to the nearest emergency room if any of your symptoms worsen or new symptoms occur.    Otitis Externa Otitis externa is a bacterial or fungal infection of the outer ear canal. This is the area from the eardrum to the outside of the ear. Otitis externa is sometimes called "swimmer's ear." CAUSES  Possible causes of infection include:  Swimming in dirty water.  Moisture remaining in the ear after swimming or bathing.  Mild injury (trauma) to the ear.  Objects stuck in the ear (foreign body).  Cuts or scrapes (abrasions) on the outside  of the ear. SIGNS AND SYMPTOMS  The first symptom of infection is often itching in the ear canal. Later signs and symptoms may include swelling and redness of the ear canal, ear pain, and yellowish-white fluid (pus) coming from the ear. The ear pain may be worse when pulling on the earlobe. DIAGNOSIS  Your health care provider will perform a physical exam. A sample of fluid may be taken from the ear and examined for bacteria or fungi. TREATMENT  Antibiotic ear drops are often given for 10 to 14 days. Treatment may also include pain medicine or corticosteroids to reduce itching and swelling. HOME CARE INSTRUCTIONS   Apply antibiotic ear drops to the ear canal as prescribed by your health care provider.  Take medicines only as directed  by your health care provider.  If you have diabetes, follow any additional treatment instructions from your health care provider.  Keep all follow-up visits as directed by your health care provider. PREVENTION   Keep your ear dry. Use the corner of a towel to absorb water out of the ear canal after swimming or bathing.  Avoid scratching or putting objects inside your ear. This can damage the ear canal or remove the protective wax that lines the canal. This makes it easier for bacteria and fungi to grow.  Avoid swimming in lakes, polluted water, or poorly chlorinated pools.  You may use ear drops made of rubbing alcohol and vinegar after swimming. Combine equal parts of white vinegar and alcohol in a bottle. Put 3 or 4 drops into each ear after swimming. SEEK MEDICAL CARE IF:   You have a fever.  Your ear is still red, swollen, painful, or draining pus after 3 days.  Your redness, swelling, or pain gets worse.  You have a severe headache.  You have redness, swelling, pain, or tenderness in the area behind your ear. MAKE SURE YOU:   Understand these instructions.  Will watch your condition.  Will get help right away if you are not doing well or  get worse.   This information is not intended to replace advice given to you by your health care provider. Make sure you discuss any questions you have with your health care provider.   Document Released: 03/15/2005 Document Revised: 04/05/2014 Document Reviewed: 04/01/2011 Elsevier Interactive Patient Education 2016 [employer].    IF you received an x-ray today, you will receive an invoice from [facility]. Please contact [facility] at [phone] with questions or concerns regarding your invoice.   IF you received labwork today, you will receive an invoice from [employer]. Please contact [facility] at [phone] with questions or concerns regarding your invoice.   Our billing staff will not be able to assist you with questions regarding bills from these companies.  You will be contacted with the lab results as soon as they are available. The fastest way to get your results is to activate your My Chart account. Instructions are located on the last page of this paperwork. If you have not heard from [place] regarding the results in 2 weeks, please contact this office.        I personally performed the services described in this documentation, which was scribed in my presence. The recorded information has been reviewed and considered, and addended by me as needed.   Signed,   [name], MD [facility].  01/28/16 6:36 PM

## 2016-01-28 NOTE — Patient Instructions (Addendum)
Start the antifungal drops as your ear infection appears to be due to fungus. However I will also refer you to Ear, Nose and Throat specialist as the fungus and other substance within the canal typically needs to be cleaned out with the use of a microscope by ear nose and throat. We should be referring you either tomorrow or Friday.  Return to the clinic or go to the nearest emergency room if any of your symptoms worsen or new symptoms occur.    Otitis Externa Otitis externa is a bacterial or fungal infection of the outer ear canal. This is the area from the eardrum to the outside of the ear. Otitis externa is sometimes called "swimmer's ear." CAUSES  Possible causes of infection include:  Swimming in dirty water.  Moisture remaining in the ear after swimming or bathing.  Mild injury (trauma) to the ear.  Objects stuck in the ear (foreign body).  Cuts or scrapes (abrasions) on the outside of the ear. SIGNS AND SYMPTOMS  The first symptom of infection is often itching in the ear canal. Later signs and symptoms may include swelling and redness of the ear canal, ear pain, and yellowish-white fluid (pus) coming from the ear. The ear pain may be worse when pulling on the earlobe. DIAGNOSIS  Your health care provider will perform a physical exam. A sample of fluid may be taken from the ear and examined for bacteria or fungi. TREATMENT  Antibiotic ear drops are often given for 10 to 14 days. Treatment may also include pain medicine or corticosteroids to reduce itching and swelling. HOME CARE INSTRUCTIONS   Apply antibiotic ear drops to the ear canal as prescribed by your health care provider.  Take medicines only as directed by your health care provider.  If you have diabetes, follow any additional treatment instructions from your health care provider.  Keep all follow-up visits as directed by your health care provider. PREVENTION   Keep your ear dry. Use the corner of a towel to  absorb water out of the ear canal after swimming or bathing.  Avoid scratching or putting objects inside your ear. This can damage the ear canal or remove the protective wax that lines the canal. This makes it easier for bacteria and fungi to grow.  Avoid swimming in lakes, polluted water, or poorly chlorinated pools.  You may use ear drops made of rubbing alcohol and vinegar after swimming. Combine equal parts of white vinegar and alcohol in a bottle. Put 3 or 4 drops into each ear after swimming. SEEK MEDICAL CARE IF:   You have a fever.  Your ear is still red, swollen, painful, or draining pus after 3 days.  Your redness, swelling, or pain gets worse.  You have a severe headache.  You have redness, swelling, pain, or tenderness in the area behind your ear. MAKE SURE YOU:   Understand these instructions.  Will watch your condition.  Will get help right away if you are not doing well or get worse.   This information is not intended to replace advice given to you by your health care provider. Make sure you discuss any questions you have with your health care provider.   Document Released: 03/15/2005 Document Revised: 04/05/2014 Document Reviewed: 04/01/2011 Elsevier Interactive Patient Education 2016 [employer].    IF you received an x-ray today, you will receive an invoice from [facility]. Please contact [facility] at [phone] with questions or concerns regarding your invoice.   IF you received  labwork today, you will receive an invoice from [employer]. Please contact [facility] at [phone] with questions or concerns regarding your invoice.   Our billing staff will not be able to assist you with questions regarding bills from these companies.  You will be contacted with the lab results as soon as they are available. The fastest way to get your results is to activate your My Chart account. Instructions are located on the  last page of this paperwork. If you have not heard from [place] regarding the results in 2 weeks, please contact this office.

## 2016-01-29 ENCOUNTER — Telehealth: Payer: Self-pay | Admitting: Emergency Medicine

## 2016-01-29 ENCOUNTER — Telehealth: Payer: Self-pay

## 2016-01-29 NOTE — Telephone Encounter (Signed)
Patient given instructions to try otc cream and discuss further option at scheduled ENT visit.

## 2016-01-29 NOTE — Telephone Encounter (Signed)
Pharmacy states insurance won't cover the ear drops.  Can Dr. Carlota Raspberry please prescribe something else.   [facility] on [address]

## 2016-01-29 NOTE — Telephone Encounter (Signed)
He can use over-the-counter clotrimazole 1% cream, small amount on the tip of his finger to the ear canal and lay on his left side. That may soften up with body heat and run into the canal to provide coverage for that infection. He can also discuss other treatments with ear nose and throat as he should be seen today or tomorrow.

## 2016-02-12 DIAGNOSIS — B369 Superficial mycosis, unspecified: Secondary | ICD-10-CM | POA: Insufficient documentation

## 2016-02-13 ENCOUNTER — Other Ambulatory Visit: Payer: Self-pay | Admitting: Physician Assistant

## 2016-02-13 DIAGNOSIS — IMO0001 Reserved for inherently not codable concepts without codable children: Secondary | ICD-10-CM

## 2016-02-13 DIAGNOSIS — E1165 Type 2 diabetes mellitus with hyperglycemia: Principal | ICD-10-CM

## 2016-05-31 ENCOUNTER — Emergency Department (HOSPITAL_COMMUNITY)
Admission: EM | Admit: 2016-05-31 | Discharge: 2016-05-31 | Disposition: A | Discharge status: Home / Self Care | Payer: BLUE CROSS/BLUE SHIELD | Attending: Emergency Medicine | Admitting: Emergency Medicine

## 2016-05-31 ENCOUNTER — Encounter (HOSPITAL_COMMUNITY): Payer: Self-pay

## 2016-05-31 DIAGNOSIS — E119 Type 2 diabetes mellitus without complications: Secondary | ICD-10-CM | POA: Diagnosis not present

## 2016-05-31 DIAGNOSIS — Z79899 Other long term (current) drug therapy: Secondary | ICD-10-CM | POA: Insufficient documentation

## 2016-05-31 DIAGNOSIS — R197 Diarrhea, unspecified: Secondary | ICD-10-CM | POA: Diagnosis not present

## 2016-05-31 DIAGNOSIS — Z7984 Long term (current) use of oral hypoglycemic drugs: Secondary | ICD-10-CM | POA: Insufficient documentation

## 2016-05-31 DIAGNOSIS — R1110 Vomiting, unspecified: Secondary | ICD-10-CM | POA: Diagnosis present

## 2016-05-31 DIAGNOSIS — R112 Nausea with vomiting, unspecified: Secondary | ICD-10-CM | POA: Insufficient documentation

## 2016-05-31 LAB — COMPREHENSIVE METABOLIC PANEL
ALBUMIN: 4.5 g/dL (ref 3.5–5.0)
ALT: 26 U/L (ref 17–63)
ANION GAP: 6 (ref 5–15)
AST: 22 U/L (ref 15–41)
Alkaline Phosphatase: 53 U/L (ref 38–126)
BUN: 12 mg/dL (ref 6–20)
CO2: 31 mmol/L (ref 22–32)
Calcium: 9.7 mg/dL (ref 8.9–10.3)
Chloride: 99 mmol/L — ABNORMAL LOW (ref 101–111)
Creatinine, Ser: 0.94 mg/dL (ref 0.61–1.24)
GFR calc non Af Amer: >60 mL/min (ref >60)
GLUCOSE: 166 mg/dL — AB (ref 65–99)
POTASSIUM: 3.8 mmol/L (ref 3.5–5.1)
SODIUM: 136 mmol/L (ref 135–145)
Total Bilirubin: 0.5 mg/dL (ref 0.3–1.2)
Total Protein: 9.2 g/dL — ABNORMAL HIGH (ref 6.5–8.1)

## 2016-05-31 LAB — CBC WITH DIFFERENTIAL/PLATELET
BASOS PCT: 0 %
Basophils Absolute: 0 10*3/uL (ref 0.0–0.1)
EOS ABS: 0 10*3/uL (ref 0.0–0.7)
Eosinophils Relative: 1 %
HCT: 40.9 % (ref 39.0–52.0)
Hemoglobin: 13.9 g/dL (ref 13.0–17.0)
Lymphocytes Relative: 57 %
Lymphs Abs: 2.5 10*3/uL (ref 0.7–4.0)
MCH: 27.1 pg (ref 26.0–34.0)
MCHC: 34 g/dL (ref 30.0–36.0)
MCV: 79.7 fL (ref 78.0–100.0)
MONO ABS: 0.5 10*3/uL (ref 0.1–1.0)
MONOS PCT: 12 %
Neutro Abs: 1.3 10*3/uL — ABNORMAL LOW (ref 1.7–7.7)
Neutrophils Relative %: 30 %
Platelets: 236 10*3/uL (ref 150–400)
RBC: 5.13 MIL/uL (ref 4.22–5.81)
RDW: 13.1 % (ref 11.5–15.5)
WBC: 4.4 10*3/uL (ref 4.0–10.5)

## 2016-05-31 LAB — I-STAT CG4 LACTIC ACID, ED: LACTIC ACID, VENOUS: 1.63 mmol/L (ref 0.5–1.9)

## 2016-05-31 LAB — SAVE SMEAR

## 2016-05-31 LAB — LIPASE, BLOOD: Lipase: 39 U/L (ref 11–51)

## 2016-05-31 MED ORDER — ONDANSETRON HCL 4 MG/2ML IJ SOLN
4.0000 mg | Freq: Once | INTRAMUSCULAR | Status: AC
  Administered 2016-05-31: 4 mg via INTRAVENOUS
  Filled 2016-05-31: qty 2

## 2016-05-31 MED ORDER — DICYCLOMINE HCL 10 MG PO CAPS
20.0000 mg | ORAL_CAPSULE | Freq: Once | ORAL | Status: AC
Start: 2016-05-31 — End: 2016-05-31
  Administered 2016-05-31: 20 mg via ORAL
  Filled 2016-05-31: qty 2

## 2016-05-31 MED ORDER — METOCLOPRAMIDE HCL 5 MG/ML IJ SOLN
10.0000 mg | Freq: Once | INTRAMUSCULAR | Status: AC
  Administered 2016-05-31: 10 mg via INTRAVENOUS
  Filled 2016-05-31: qty 2

## 2016-05-31 MED ORDER — SODIUM CHLORIDE 0.9 % IV BOLUS (SEPSIS)
1000.0000 mL | Freq: Once | INTRAVENOUS | Status: AC
  Administered 2016-05-31: 1000 mL via INTRAVENOUS

## 2016-05-31 MED ORDER — FENTANYL CITRATE (PF) 100 MCG/2ML IJ SOLN
50.0000 ug | Freq: Once | INTRAMUSCULAR | Status: AC
  Administered 2016-05-31: 50 ug via INTRAVENOUS
  Filled 2016-05-31: qty 2

## 2016-05-31 MED ORDER — ONDANSETRON 4 MG PO TBDP: 4.0000 mg | ORAL_TABLET | Freq: Three times a day (TID) | ORAL | 0 refills | Status: DC | PRN
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
## 2016-05-31 NOTE — ED Notes (Signed)
Pt. States pain and nausea relieved at this time. Pt. ambulatory to waiting room. NAD.

## 2016-05-31 NOTE — ED Triage Notes (Signed)
Pt states N/V x 2 days. Pt states recently returned from [place] from vacation. Pt denies any diarrhea. Pt states emesis x 2 today.

## 2016-06-02 ENCOUNTER — Ambulatory Visit (INDEPENDENT_AMBULATORY_CARE_PROVIDER_SITE_OTHER): Payer: BLUE CROSS/BLUE SHIELD | Admitting: Emergency Medicine

## 2016-06-02 VITALS — BP 144/88 | HR 71 | Temp 98.2°F | Resp 17 | Ht 76.0 in | Wt 195.0 lb

## 2016-06-02 DIAGNOSIS — R5383 Other fatigue: Secondary | ICD-10-CM | POA: Diagnosis not present

## 2016-06-02 DIAGNOSIS — R5381 Other malaise: Secondary | ICD-10-CM | POA: Diagnosis not present

## 2016-06-02 DIAGNOSIS — R112 Nausea with vomiting, unspecified: Secondary | ICD-10-CM | POA: Insufficient documentation

## 2016-06-02 DIAGNOSIS — B349 Viral infection, unspecified: Secondary | ICD-10-CM | POA: Diagnosis not present

## 2016-06-02 DIAGNOSIS — R197 Diarrhea, unspecified: Secondary | ICD-10-CM | POA: Diagnosis not present

## 2016-06-02 DIAGNOSIS — E119 Type 2 diabetes mellitus without complications: Secondary | ICD-10-CM

## 2016-06-02 LAB — POCT CBC
GRANULOCYTE PERCENT: 44.9 % (ref 37–80)
HCT, POC: 39.9 % — AB (ref 43.5–53.7)
Hemoglobin: 13.7 g/dL — AB (ref 14.1–18.1)
Lymph, poc: 2.5 (ref 0.6–3.4)
MCH: 27.3 pg (ref 27–31.2)
MCHC: 34.4 g/dL (ref 31.8–35.4)
MCV: 79.4 fL — AB (ref 80–97)
MID (CBC): 0.1 (ref 0–0.9)
MPV: 7.4 fL (ref 0–99.8)
POC GRANULOCYTE: 2.1 (ref 2–6.9)
POC LYMPH %: 52.7 % — AB (ref 10–50)
POC MID %: 2.4 % (ref 0–12)
Platelet Count, POC: 241 10*3/uL (ref 142–424)
RBC: 5.02 M/uL (ref 4.69–6.13)
RDW, POC: 12.9 %
WBC: 4.7 10*3/uL (ref 4.6–10.2)

## 2016-06-02 LAB — POCT GLYCOSYLATED HEMOGLOBIN (HGB A1C): HEMOGLOBIN A1C: 6.8

## 2016-06-02 NOTE — Progress Notes (Signed)
Kevin Evans 46 y.o.   Chief Complaint  Patient presents with  . Nausea  . Fatigue    HISTORY OF PRESENT ILLNESS: This is a 46 y.o. male recently returned from [place] sick c/o nausea, vomiting, non-bloody diarrhea, and general malaise; seen in th ED last weekend, hydrated, medicated, and tested for malaria; bloodwork unremarkable. Better today but still feeling weak; no longer vomiting and diarrhea improving; was diagnosed with Gastroenteritis.  HPI   Prior to Admission medications   Medication Sig Start Date End Date Taking? Authorizing Provider  atorvastatin (LIPITOR) 10 MG tablet Take 1 tablet (10 mg total) by mouth daily. 01/20/16  Yes Chelle Jeffery, PA-C  Blood Glucose Monitoring Suppl w/Device KIT Monitor glucose twice daily 07/04/15  Yes Bennett Scrape V, PA-C  clotrimazole (LOTRIMIN) 1 % external solution Apply 1 application topically 2 (two) times daily. (5 drops to R ear BID) 01/28/16  Yes Wendie Agreste, MD  metFORMIN (GLUCOPHAGE) 1000 MG tablet Take 1 tablet (1,000 mg total) by mouth 2 (two) times daily with a meal. 12/10/15  Yes Jaynee Eagles, PA-C  ondansetron (ZOFRAN ODT) 4 MG disintegrating tablet Take 1 tablet (4 mg total) by mouth every 8 (eight) hours as needed for nausea or vomiting. 05/31/16  Yes Everlene Balls, MD  pantoprazole (PROTONIX) 40 MG tablet TAKE 1 TABLET BY MOUTH DAILY 01/19/16  Yes Jerene Bears, MD  triamcinolone cream (KENALOG) 0.1 % Apply 1 application topically 2 (two) times daily. 12/10/15  Yes Jaynee Eagles, PA-C    Allergies  Allergen Reactions  . Bactrim [Sulfamethoxazole-Trimethoprim] Other (See Comments)    Itching all over body     There are no active problems to display for this patient.   Past Medical History:  Diagnosis Date  . Diabetes mellitus without complication ([place])   . GERD (gastroesophageal reflux disease)   . HLD (hyperlipidemia)   . HTN (hypertension)     No past surgical history on file.  Social History   Social History  .  Marital status: Single    Spouse name: N/A  . Number of children: 0  . Years of education: N/A   Occupational History  . truck driver    Social History Main Topics  . Smoking status: Never Smoker  . Smokeless tobacco: Never Used  . Alcohol use No  . Drug use: No  . Sexual activity: Yes    Birth control/ protection: Condom   Other Topics Concern  . Not on file   Social History Narrative  . No narrative on file    Family History  Problem Relation Age of Onset  . Hypertension Mother   . Stroke Mother   . Diabetes Paternal Aunt   . Diabetes Paternal Uncle   . Breast cancer Paternal Aunt      Review of Systems  Constitutional: Positive for malaise/fatigue. Negative for chills and fever.  HENT: Negative for ear pain and sore throat.   Eyes: Negative for discharge and redness.  Respiratory: Negative for cough and wheezing.   Cardiovascular: Negative for chest pain, palpitations and leg swelling.  Gastrointestinal: Positive for abdominal pain (rumbling), diarrhea, nausea and vomiting.  Genitourinary: Negative for dysuria and hematuria.  Musculoskeletal: Negative for back pain, joint pain, myalgias and neck pain.  Skin: Negative for rash.  Neurological: Positive for weakness. Negative for dizziness and headaches.  Endo/Heme/Allergies: Does not bruise/bleed easily.  All other systems reviewed and are negative.  Vitals:   06/02/16 1536  BP: (!) 144/88  Pulse: 71  Resp: 17  Temp: 98.2 F (36.8 C)     Physical Exam  Constitutional: He is oriented to person, place, and time. He appears well-developed and well-nourished.  HENT:  Head: Normocephalic and atraumatic.  Eyes: Conjunctivae and EOM are normal. Pupils are equal, round, and reactive to light.  Neck: Normal range of motion. Neck supple. No JVD present. No thyromegaly present.  Cardiovascular: Normal rate, regular rhythm and normal heart sounds.   Pulmonary/Chest: Effort normal and breath sounds normal.    Abdominal: Soft. Bowel sounds are normal. He exhibits no distension. There is no tenderness.  Musculoskeletal: Normal range of motion.  Lymphadenopathy:    He has no cervical adenopathy.  Neurological: He is alert and oriented to person, place, and time. No sensory deficit. He exhibits normal muscle tone.  Skin: Skin is warm and dry. Capillary refill takes less than 2 seconds.  Psychiatric: He has a normal mood and affect. His behavior is normal.  Vitals reviewed.    ASSESSMENT & PLAN:  Derak was seen today for nausea and fatigue.  Diagnoses and all orders for this visit:  Viral illness Comments: improving Orders: -     Comprehensive metabolic panel -     POCT CBC  Non-intractable vomiting with nausea, unspecified vomiting type  Diarrhea, unspecified type  Malaise and fatigue  Type 2 diabetes mellitus without complication, without long-term current use of insulin (HCC) -     POCT glycosylated hemoglobin (Hb A1C)    Patient Instructions       IF you received an x-ray today, you will receive an invoice from [facility]. Please contact [facility] at [phone] with questions or concerns regarding your invoice.   IF you received labwork today, you will receive an invoice from [facility]. Please contact [facility] at [phone] with questions or concerns regarding your invoice.   Our billing staff will not be able to assist you with questions regarding bills from these companies.  You will be contacted with the lab results as soon as they are available. The fastest way to get your results is to activate your My Chart account. Instructions are located on the last page of this paperwork. If you have not heard from [place] regarding the results in 2 weeks, please contact this office.      Viral Illness, Adult Viruses are tiny germs that can get into a person's body and cause illness. There are many different types of viruses, and they cause many types of  illness. Viral illnesses can range from mild to severe. They can affect various parts of the body. Common illnesses that are caused by a virus include colds and the flu. Viral illnesses also include serious conditions such as HIV/AIDS (human immunodeficiency virus/acquired immunodeficiency syndrome). A few viruses have been linked to certain cancers. What are the causes? Many types of viruses can cause illness. Viruses invade cells in your body, multiply, and cause the infected cells to malfunction or die. When the cell dies, it releases more of the virus. When this happens, you develop symptoms of the illness, and the virus continues to spread to other cells. If the virus takes over the function of the cell, it can cause the cell to divide and grow out of control, as is the case when a virus causes cancer. Different viruses get into the body in different ways. You can get a virus by:  Swallowing food or water that is contaminated with the virus.  Breathing in droplets that have been coughed or  sneezed into the air by an infected person.  Touching a surface that has been contaminated with the virus and then touching your eyes, nose, or mouth.  Being bitten by an insect or animal that carries the virus.  Having sexual contact with a person who is infected with the virus.  Being exposed to blood or fluids that contain the virus, either through an open cut or during a transfusion. If a virus enters your body, your body's defense system (immune system) will try to fight the virus. You may be at higher risk for a viral illness if your immune system is weak. What are the signs or symptoms? Symptoms vary depending on the type of virus and the location of the cells that it invades. Common symptoms of the main types of viral illnesses include: Cold and flu viruses   Fever.  Headache.  Sore throat.  Muscle aches.  Nasal congestion.  Cough. Digestive system (gastrointestinal) viruses    Fever.  Abdominal pain.  Nausea.  Diarrhea. Liver viruses (hepatitis)   Loss of appetite.  Tiredness.  Yellowing of the skin (jaundice). Brain and spinal cord viruses   Fever.  Headache.  Stiff neck.  Nausea and vomiting.  Confusion or sleepiness. Skin viruses   Warts.  Itching.  Rash. Sexually transmitted viruses   Discharge.  Swelling.  Redness.  Rash. How is this treated? Viruses can be difficult to treat because they live within cells. Antibiotic medicines do not treat viruses because these drugs do not get inside cells. Treatment for a viral illness may include:  Resting and drinking plenty of fluids.  Medicines to relieve symptoms. These can include over-the-counter medicine for pain and fever, medicines for cough or congestion, and medicines to relieve diarrhea.  Antiviral medicines. These drugs are available only for certain types of viruses. They may help reduce flu symptoms if taken early. There are also many antiviral medicines for hepatitis and HIV/AIDS. Some viral illnesses can be prevented with vaccinations. A common example is the flu shot. Follow these instructions at home: Medicines    Take over-the-counter and prescription medicines only as told by your health care provider.  If you were prescribed an antiviral medicine, take it as told by your health care provider. Do not stop taking the medicine even if you start to feel better.  Be aware of when antibiotics are needed and when they are not needed. Antibiotics do not treat viruses. If your health care provider thinks that you may have a bacterial infection as well as a viral infection, you may get an antibiotic.  Do not ask for an antibiotic prescription if you have been diagnosed with a viral illness. That will not make your illness go away faster.  Frequently taking antibiotics when they are not needed can lead to antibiotic resistance. When this develops, the medicine no longer  works against the bacteria that it normally fights. General instructions   Drink enough fluids to keep your urine clear or pale yellow.  Rest as much as possible.  Return to your normal activities as told by your health care provider. Ask your health care provider what activities are safe for you.  Keep all follow-up visits as told by your health care provider. This is important. How is this prevented? Take these actions to reduce your risk of viral infection:  Eat a healthy diet and get enough rest.  Wash your hands often with soap and water. This is especially important when you are in public places. If  soap and water are not available, use hand sanitizer.  Avoid close contact with friends and family who have a viral illness.  If you travel to areas where viral gastrointestinal infection is common, avoid drinking water or eating raw food.  Keep your immunizations up to date. Get a flu shot every year as told by your health care provider.  Do not share toothbrushes, nail clippers, razors, or needles with other people.  Always practice safe sex. Contact a health care provider if:  You have symptoms of a viral illness that do not go away.  Your symptoms come back after going away.  Your symptoms get worse. Get help right away if:  You have trouble breathing.  You have a severe headache or a stiff neck.  You have severe vomiting or abdominal pain. This information is not intended to replace advice given to you by your health care provider. Make sure you discuss any questions you have with your health care provider. Document Released: 07/25/2015 Document Revised: 08/27/2015 Document Reviewed: 07/25/2015 Elsevier Interactive Patient Education  2017 [employer]     Agustina Caroli, MD [facility]

## 2016-06-02 NOTE — Patient Instructions (Addendum)
   IF you received an x-ray today, you will receive an invoice from [facility]. Please contact  Radiology at [phone] with questions or concerns regarding your invoice.   IF you received labwork today, you will receive an invoice from [facility]. Please contact [facility] at [phone] with questions or concerns regarding your invoice.   Our billing staff will not be able to assist you with questions regarding bills from these companies.  You will be contacted with the lab results as soon as they are available. The fastest way to get your results is to activate your My Chart account. Instructions are located on the last page of this paperwork. If you have not heard from us regarding the results in 2 weeks, please contact this office.     Viral Illness, Adult Viruses are tiny germs that can get into a person's body and cause illness. There are many different types of viruses, and they cause many types of illness. Viral illnesses can range from mild to severe. They can affect various parts of the body. Common illnesses that are caused by a virus include colds and the flu. Viral illnesses also include serious conditions such as HIV/AIDS (human immunodeficiency virus/acquired immunodeficiency syndrome). A few viruses have been linked to certain cancers. What are the causes? Many types of viruses can cause illness. Viruses invade cells in your body, multiply, and cause the infected cells to malfunction or die. When the cell dies, it releases more of the virus. When this happens, you develop symptoms of the illness, and the virus continues to spread to other cells. If the virus takes over the function of the cell, it can cause the cell to divide and grow out of control, as is the case when a virus causes cancer. Different viruses get into the body in different ways. You can get a virus by:  Swallowing food or water that is contaminated with the virus.  Breathing in droplets  that have been coughed or sneezed into the air by an infected person.  Touching a surface that has been contaminated with the virus and then touching your eyes, nose, or mouth.  Being bitten by an insect or animal that carries the virus.  Having sexual contact with a person who is infected with the virus.  Being exposed to blood or fluids that contain the virus, either through an open cut or during a transfusion. If a virus enters your body, your body's defense system (immune system) will try to fight the virus. You may be at higher risk for a viral illness if your immune system is weak. What are the signs or symptoms? Symptoms vary depending on the type of virus and the location of the cells that it invades. Common symptoms of the main types of viral illnesses include: Cold and flu viruses   Fever.  Headache.  Sore throat.  Muscle aches.  Nasal congestion.  Cough. Digestive system (gastrointestinal) viruses   Fever.  Abdominal pain.  Nausea.  Diarrhea. Liver viruses (hepatitis)   Loss of appetite.  Tiredness.  Yellowing of the skin (jaundice). Brain and spinal cord viruses   Fever.  Headache.  Stiff neck.  Nausea and vomiting.  Confusion or sleepiness. Skin viruses   Warts.  Itching.  Rash. Sexually transmitted viruses   Discharge.  Swelling.  Redness.  Rash. How is this treated? Viruses can be difficult to treat because they live within cells. Antibiotic medicines do not treat viruses because these drugs do not get inside cells.   Treatment for a viral illness may include:  Resting and drinking plenty of fluids.  Medicines to relieve symptoms. These can include over-the-counter medicine for pain and fever, medicines for cough or congestion, and medicines to relieve diarrhea.  Antiviral medicines. These drugs are available only for certain types of viruses. They may help reduce flu symptoms if taken early. There are also many antiviral  medicines for hepatitis and HIV/AIDS. Some viral illnesses can be prevented with vaccinations. A common example is the flu shot. Follow these instructions at home: Medicines    Take over-the-counter and prescription medicines only as told by your health care provider.  If you were prescribed an antiviral medicine, take it as told by your health care provider. Do not stop taking the medicine even if you start to feel better.  Be aware of when antibiotics are needed and when they are not needed. Antibiotics do not treat viruses. If your health care provider thinks that you may have a bacterial infection as well as a viral infection, you may get an antibiotic.  Do not ask for an antibiotic prescription if you have been diagnosed with a viral illness. That will not make your illness go away faster.  Frequently taking antibiotics when they are not needed can lead to antibiotic resistance. When this develops, the medicine no longer works against the bacteria that it normally fights. General instructions   Drink enough fluids to keep your urine clear or pale yellow.  Rest as much as possible.  Return to your normal activities as told by your health care provider. Ask your health care provider what activities are safe for you.  Keep all follow-up visits as told by your health care provider. This is important. How is this prevented? Take these actions to reduce your risk of viral infection:  Eat a healthy diet and get enough rest.  Wash your hands often with soap and water. This is especially important when you are in public places. If soap and water are not available, use hand sanitizer.  Avoid close contact with friends and family who have a viral illness.  If you travel to areas where viral gastrointestinal infection is common, avoid drinking water or eating raw food.  Keep your immunizations up to date. Get a flu shot every year as told by your health care provider.  Do not share  toothbrushes, nail clippers, razors, or needles with other people.  Always practice safe sex. Contact a health care provider if:  You have symptoms of a viral illness that do not go away.  Your symptoms come back after going away.  Your symptoms get worse. Get help right away if:  You have trouble breathing.  You have a severe headache or a stiff neck.  You have severe vomiting or abdominal pain. This information is not intended to replace advice given to you by your health care provider. Make sure you discuss any questions you have with your health care provider. Document Released: 07/25/2015 Document Revised: 08/27/2015 Document Reviewed: 07/25/2015 Elsevier Interactive Patient Education  2017 Elsevier Inc.   

## 2016-06-03 LAB — COMPREHENSIVE METABOLIC PANEL
ALK PHOS: 61 IU/L (ref 39–117)
ALT: 21 IU/L (ref 0–44)
AST: 19 IU/L (ref 0–40)
Albumin/Globulin Ratio: 1.1 — ABNORMAL LOW (ref 1.2–2.2)
Albumin: 4.1 g/dL (ref 3.5–5.5)
BUN/Creatinine Ratio: 13 (ref 9–20)
BUN: 13 mg/dL (ref 6–24)
Bilirubin Total: <0.2 mg/dL (ref 0.0–1.2)
CHLORIDE: 97 mmol/L (ref 96–106)
CO2: 24 mmol/L (ref 18–29)
CREATININE: 0.98 mg/dL (ref 0.76–1.27)
Calcium: 9.5 mg/dL (ref 8.7–10.2)
GFR calc Af Amer: 109 mL/min/{1.73_m2} (ref >59)
GFR calc non Af Amer: 94 mL/min/{1.73_m2} (ref >59)
GLUCOSE: 207 mg/dL — AB (ref 65–99)
Globulin, Total: 3.6 g/dL (ref 1.5–4.5)
Potassium: 4.4 mmol/L (ref 3.5–5.2)
Sodium: 139 mmol/L (ref 134–144)
Total Protein: 7.7 g/dL (ref 6.0–8.5)

## 2016-07-18 ENCOUNTER — Other Ambulatory Visit: Payer: Self-pay | Admitting: Internal Medicine

## 2016-07-18 ENCOUNTER — Other Ambulatory Visit: Payer: Self-pay | Admitting: Physician Assistant

## 2016-11-05 ENCOUNTER — Other Ambulatory Visit: Payer: Self-pay | Admitting: Family Medicine

## 2017-03-06 ENCOUNTER — Other Ambulatory Visit: Payer: Self-pay | Admitting: Internal Medicine

## 2017-03-06 ENCOUNTER — Other Ambulatory Visit: Payer: Self-pay | Admitting: Urgent Care

## 2017-03-06 DIAGNOSIS — E119 Type 2 diabetes mellitus without complications: Secondary | ICD-10-CM

## 2017-03-09 ENCOUNTER — Other Ambulatory Visit: Payer: Self-pay

## 2017-03-09 ENCOUNTER — Encounter: Payer: Self-pay | Admitting: Emergency Medicine

## 2017-03-09 ENCOUNTER — Ambulatory Visit: Payer: BLUE CROSS/BLUE SHIELD | Admitting: Emergency Medicine

## 2017-03-09 VITALS — BP 130/72 | HR 75 | Temp 98.8°F | Resp 16 | Ht 75.0 in | Wt 198.6 lb

## 2017-03-09 DIAGNOSIS — E785 Hyperlipidemia, unspecified: Secondary | ICD-10-CM | POA: Insufficient documentation

## 2017-03-09 DIAGNOSIS — Z23 Encounter for immunization: Secondary | ICD-10-CM | POA: Diagnosis not present

## 2017-03-09 DIAGNOSIS — E119 Type 2 diabetes mellitus without complications: Secondary | ICD-10-CM | POA: Diagnosis not present

## 2017-03-09 LAB — POCT GLYCOSYLATED HEMOGLOBIN (HGB A1C): Hemoglobin A1C: 7.1

## 2017-03-09 MED ORDER — ATORVASTATIN CALCIUM 10 MG PO TABS: 10.0000 mg | ORAL_TABLET | Freq: Every day | ORAL | 3 refills | Status: DC

## 2017-03-09 MED ORDER — PANTOPRAZOLE SODIUM 40 MG PO TBEC: 40.0000 mg | DELAYED_RELEASE_TABLET | Freq: Every day | ORAL | 3 refills | Status: DC

## 2017-03-09 MED ORDER — METFORMIN HCL 1000 MG PO TABS: 1000.0000 mg | ORAL_TABLET | Freq: Two times a day (BID) | ORAL | 3 refills | Status: DC

## 2017-03-09 NOTE — Progress Notes (Signed)
Kevin Evans 46 y.o.   Chief Complaint  Patient presents with  . Follow-up    diabetes   . Medication Refill    LIPITOR, METFORMIN and PROTONIX    HISTORY OF PRESENT ILLNESS: This is a 46 y.o. male here for diabetes follow up; has no complaints; doing well. Blood sugars at home: 80-120's.  HPI   Prior to Admission medications   Medication Sig Start Date End Date Taking? Authorizing Provider  atorvastatin (LIPITOR) 10 MG tablet Take 1 tablet (10 mg total) by mouth daily. NEED APPT FOR ADDITIONAL REFILLS 11/06/16  Yes Wendie Agreste, MD  Blood Glucose Monitoring Suppl w/Device KIT Monitor glucose twice daily 07/04/15  Yes Bush, Benjaman Pott, PA-C  clotrimazole (LOTRIMIN) 1 % external solution Apply 1 application topically 2 (two) times daily. (5 drops to R ear BID) 01/28/16  Yes Wendie Agreste, MD  metFORMIN (GLUCOPHAGE) 1000 MG tablet Take 1 tablet (1,000 mg total) by mouth daily with breakfast. Office visit needed 03/06/17  Yes Wendie Agreste, MD  pantoprazole (PROTONIX) 40 MG tablet TAKE 1 TABLET BY MOUTH DAILY 03/09/17  Yes Pyrtle, Lajuan Lines, MD  ondansetron (ZOFRAN ODT) 4 MG disintegrating tablet Take 1 tablet (4 mg total) by mouth every 8 (eight) hours as needed for nausea or vomiting. Patient not taking: Reported on 03/09/2017 05/31/16   Everlene Balls, MD  triamcinolone cream (KENALOG) 0.1 % Apply 1 application topically 2 (two) times daily. Patient not taking: Reported on 03/09/2017 12/10/15   Jaynee Eagles, PA-C    Allergies  Allergen Reactions  . Bactrim [Sulfamethoxazole-Trimethoprim] Other (See Comments)    Itching all over body     Patient Active Problem List   Diagnosis Date Noted  . Non-intractable vomiting with nausea 06/02/2016  . Viral illness 06/02/2016  . Malaise and fatigue 06/02/2016    Past Medical History:  Diagnosis Date  . Diabetes mellitus without complication ([place])   . GERD (gastroesophageal reflux disease)   . HLD (hyperlipidemia)   . HTN (hypertension)      No past surgical history on file.  Social History   Socioeconomic History  . Marital status: Single    Spouse name: Not on file  . Number of children: 0  . Years of education: Not on file  . Highest education level: Not on file  Social Needs  . Financial resource strain: Not on file  . Food insecurity - worry: Not on file  . Food insecurity - inability: Not on file  . Transportation needs - medical: Not on file  . Transportation needs - non-medical: Not on file  Occupational History  . Occupation: truck Geophysicist/field seismologist  Tobacco Use  . Smoking status: Never Smoker  . Smokeless tobacco: Never Used  Substance and Sexual Activity  . Alcohol use: No    Alcohol/week: 0.0 oz  . Drug use: No  . Sexual activity: Yes    Birth control/protection: Condom  Other Topics Concern  . Not on file  Social History Narrative  . Not on file    Family History  Problem Relation Age of Onset  . Hypertension Mother   . Stroke Mother   . Diabetes Paternal Aunt   . Diabetes Paternal Uncle   . Breast cancer Paternal Aunt      Review of Systems  Constitutional: Negative.  Negative for chills, fever and malaise/fatigue.  HENT: Negative.   Eyes: Negative.   Respiratory: Negative.  Negative for cough.   Cardiovascular: Negative.  Negative for chest pain  and palpitations.  Gastrointestinal: Negative.  Negative for abdominal pain, diarrhea, nausea and vomiting.  Genitourinary: Negative.   Musculoskeletal: Negative.   Skin: Negative.  Negative for rash.  Neurological: Negative.  Negative for dizziness and headaches.  Endo/Heme/Allergies: Negative.   All other systems reviewed and are negative.  Vitals:   03/09/17 1449  BP: 130/72  Pulse: 75  Resp: 16  Temp: 98.8 F (37.1 C)  SpO2: 99%     Physical Exam  Constitutional: He is oriented to person, place, and time. He appears well-developed and well-nourished.  HENT:  Head: Normocephalic and atraumatic.  Nose: Nose normal.   Mouth/Throat: Oropharynx is clear and moist.  Eyes: Conjunctivae and EOM are normal. Pupils are equal, round, and reactive to light.  Neck: Normal range of motion. Neck supple. No JVD present. No thyromegaly present.  Cardiovascular: Normal rate, regular rhythm, normal heart sounds and intact distal pulses.  Pulmonary/Chest: Effort normal and breath sounds normal. No respiratory distress.  Abdominal: Soft. Bowel sounds are normal. He exhibits no distension and no mass. There is no tenderness. There is no guarding.  Musculoskeletal: Normal range of motion.  Lymphadenopathy:    He has no cervical adenopathy.  Neurological: He is alert and oriented to person, place, and time. He displays normal reflexes. No sensory deficit. He exhibits normal muscle tone.  Skin: Skin is warm and dry. Capillary refill takes less than 2 seconds. No rash noted.  Psychiatric: He has a normal mood and affect. His behavior is normal.  Vitals reviewed.    Results for orders placed or performed in visit on 03/09/17 (from the past 24 hour(s))  POCT glycosylated hemoglobin (Hb A1C)     Status: None   Collection Time: 03/09/17  3:44 PM  Result Value Ref Range   Hemoglobin A1C 7.1     Will continue present management and re-assess in 3 months.  ASSESSMENT & PLAN: Kevin Evans was seen today for follow-up and medication refill.  Diagnoses and all orders for this visit:  Type 2 diabetes mellitus without complication, without long-term current use of insulin (HCC) -     POCT glycosylated hemoglobin (Hb A1C) -     CBC with Differential/Platelet -     Comprehensive metabolic panel  Need for prophylactic vaccination and inoculation against influenza -     Flu Vaccine QUAD 36+ mos IM  Hyperlipidemia, unspecified hyperlipidemia type -     Lipid panel  Controlled type 2 diabetes mellitus without complication, without long-term current use of insulin (HCC) -     metFORMIN (GLUCOPHAGE) 1000 MG tablet; Take 1 tablet (1,000  mg total) by mouth 2 (two) times daily with a meal.  Other orders -     atorvastatin (LIPITOR) 10 MG tablet; Take 1 tablet (10 mg total) by mouth daily. -     pantoprazole (PROTONIX) 40 MG tablet; Take 1 tablet (40 mg total) by mouth daily.   Patient Instructions       IF you received an x-ray today, you will receive an invoice from [facility]. Please contact [facility] at [phone] with questions or concerns regarding your invoice.   IF you received labwork today, you will receive an invoice from [place]. Please contact [facility] at [phone] with questions or concerns regarding your invoice.   Our billing staff will not be able to assist you with questions regarding bills from these companies.  You will be contacted with the lab results as soon as they are available. The fastest way to get  your results is to activate your My Chart account. Instructions are located on the last page of this paperwork. If you have not heard from [place] regarding the results in 2 weeks, please contact this office.    Diabetes Mellitus and Food It is important for you to manage your blood sugar (glucose) level. Your blood glucose level can be greatly affected by what you eat. Eating healthier foods in the appropriate amounts throughout the day at about the same time each day will help you control your blood glucose level. It can also help slow or prevent worsening of your diabetes mellitus. Healthy eating may even help you improve the level of your blood pressure and reach or maintain a healthy weight. General recommendations for healthful eating and cooking habits include:  Eating meals and snacks regularly. Avoid going long periods of time without eating to lose weight.  Eating a diet that consists mainly of plant-based foods, such as fruits, vegetables, nuts, legumes, and whole grains.  Using low-heat cooking methods, such as baking, instead of high-heat cooking methods, such as  deep frying.  Work with your dietitian to make sure you understand how to use the Nutrition Facts information on food labels. How can food affect me? Carbohydrates Carbohydrates affect your blood glucose level more than any other type of food. Your dietitian will help you determine how many carbohydrates to eat at each meal and teach you how to count carbohydrates. Counting carbohydrates is important to keep your blood glucose at a healthy level, especially if you are using insulin or taking certain medicines for diabetes mellitus. Alcohol Alcohol can cause sudden decreases in blood glucose (hypoglycemia), especially if you use insulin or take certain medicines for diabetes mellitus. Hypoglycemia can be a life-threatening condition. Symptoms of hypoglycemia (sleepiness, dizziness, and disorientation) are similar to symptoms of having too much alcohol. If your health care provider has given you approval to drink alcohol, do so in moderation and use the following guidelines:  Women should not have more than one drink per day, and men should not have more than two drinks per day. One drink is equal to: ? 12 oz of beer. ? 5 oz of wine. ? 1 oz of hard liquor.  Do not drink on an empty stomach.  Keep yourself hydrated. Have water, diet soda, or unsweetened iced tea.  Regular soda, juice, and other mixers might contain a lot of carbohydrates and should be counted.  What foods are not recommended? As you make food choices, it is important to remember that all foods are not the same. Some foods have fewer nutrients per serving than other foods, even though they might have the same number of calories or carbohydrates. It is difficult to get your body what it needs when you eat foods with fewer nutrients. Examples of foods that you should avoid that are high in calories and carbohydrates but low in nutrients include:  Trans fats (most processed foods list trans fats on the Nutrition Facts  label).  Regular soda.  Juice.  Candy.  Sweets, such as cake, pie, doughnuts, and cookies.  Fried foods.  What foods can I eat? Eat nutrient-rich foods, which will nourish your body and keep you healthy. The food you should eat also will depend on several factors, including:  The calories you need.  The medicines you take.  Your weight.  Your blood glucose level.  Your blood pressure level.  Your cholesterol level.  You should eat a variety of foods, including:  Protein. ? Lean cuts of meat. ? Proteins low in saturated fats, such as fish, egg whites, and beans. Avoid processed meats.  Fruits and vegetables. ? Fruits and vegetables that may help control blood glucose levels, such as apples, mangoes, and yams.  Dairy products. ? Choose fat-free or low-fat dairy products, such as milk, yogurt, and cheese.  Grains, bread, pasta, and rice. ? Choose whole grain products, such as multigrain bread, whole oats, and brown rice. These foods may help control blood pressure.  Fats. ? Foods containing healthful fats, such as nuts, avocado, olive oil, canola oil, and fish.  Does everyone with diabetes mellitus have the same meal plan? Because every person with diabetes mellitus is different, there is not one meal plan that works for everyone. It is very important that you meet with a dietitian who will help you create a meal plan that is just right for you. This information is not intended to replace advice given to you by your health care provider. Make sure you discuss any questions you have with your health care provider. Document Released: 12/10/2004 Document Revised: 08/21/2015 Document Reviewed: 02/09/2013 Elsevier Interactive Patient Education  2017 Elsevier Inc.      Agustina Caroli, MD [facility]

## 2017-03-09 NOTE — Patient Instructions (Addendum)
IF you received an x-ray today, you will receive an invoice from [facility]. Please contact [facility] at [phone] with questions or concerns regarding your invoice.   IF you received labwork today, you will receive an invoice from [place]. Please contact [facility] at [phone] with questions or concerns regarding your invoice.   Our billing staff will not be able to assist you with questions regarding bills from these companies.  You will be contacted with the lab results as soon as they are available. The fastest way to get your results is to activate your My Chart account. Instructions are located on the last page of this paperwork. If you have not heard from [place] regarding the results in 2 weeks, please contact this office.    Diabetes Mellitus and Food It is important for you to manage your blood sugar (glucose) level. Your blood glucose level can be greatly affected by what you eat. Eating healthier foods in the appropriate amounts throughout the day at about the same time each day will help you control your blood glucose level. It can also help slow or prevent worsening of your diabetes mellitus. Healthy eating may even help you improve the level of your blood pressure and reach or maintain a healthy weight. General recommendations for healthful eating and cooking habits include:  Eating meals and snacks regularly. Avoid going long periods of time without eating to lose weight.  Eating a diet that consists mainly of plant-based foods, such as fruits, vegetables, nuts, legumes, and whole grains.  Using low-heat cooking methods, such as baking, instead of high-heat cooking methods, such as deep frying.  Work with your dietitian to make sure you understand how to use the Nutrition Facts information on food labels. How can food affect me? Carbohydrates Carbohydrates affect your blood glucose level more than any other type of food. Your dietitian will help you  determine how many carbohydrates to eat at each meal and teach you how to count carbohydrates. Counting carbohydrates is important to keep your blood glucose at a healthy level, especially if you are using insulin or taking certain medicines for diabetes mellitus. Alcohol Alcohol can cause sudden decreases in blood glucose (hypoglycemia), especially if you use insulin or take certain medicines for diabetes mellitus. Hypoglycemia can be a life-threatening condition. Symptoms of hypoglycemia (sleepiness, dizziness, and disorientation) are similar to symptoms of having too much alcohol. If your health care provider has given you approval to drink alcohol, do so in moderation and use the following guidelines:  Women should not have more than one drink per day, and men should not have more than two drinks per day. One drink is equal to: ? 12 oz of beer. ? 5 oz of wine. ? 1 oz of hard liquor.  Do not drink on an empty stomach.  Keep yourself hydrated. Have water, diet soda, or unsweetened iced tea.  Regular soda, juice, and other mixers might contain a lot of carbohydrates and should be counted.  What foods are not recommended? As you make food choices, it is important to remember that all foods are not the same. Some foods have fewer nutrients per serving than other foods, even though they might have the same number of calories or carbohydrates. It is difficult to get your body what it needs when you eat foods with fewer nutrients. Examples of foods that you should avoid that are high in calories and carbohydrates but low in nutrients include:  Trans fats (most processed foods  list trans fats on the Nutrition Facts label).  Regular soda.  Juice.  Candy.  Sweets, such as cake, pie, doughnuts, and cookies.  Fried foods.  What foods can I eat? Eat nutrient-rich foods, which will nourish your body and keep you healthy. The food you should eat also will depend on several factors,  including:  The calories you need.  The medicines you take.  Your weight.  Your blood glucose level.  Your blood pressure level.  Your cholesterol level.  You should eat a variety of foods, including:  Protein. ? Lean cuts of meat. ? Proteins low in saturated fats, such as fish, egg whites, and beans. Avoid processed meats.  Fruits and vegetables. ? Fruits and vegetables that may help control blood glucose levels, such as apples, mangoes, and yams.  Dairy products. ? Choose fat-free or low-fat dairy products, such as milk, yogurt, and cheese.  Grains, bread, pasta, and rice. ? Choose whole grain products, such as multigrain bread, whole oats, and brown rice. These foods may help control blood pressure.  Fats. ? Foods containing healthful fats, such as nuts, avocado, olive oil, canola oil, and fish.  Does everyone with diabetes mellitus have the same meal plan? Because every person with diabetes mellitus is different, there is not one meal plan that works for everyone. It is very important that you meet with a dietitian who will help you create a meal plan that is just right for you. This information is not intended to replace advice given to you by your health care provider. Make sure you discuss any questions you have with your health care provider. Document Released: 12/10/2004 Document Revised: 08/21/2015 Document Reviewed: 02/09/2013 Elsevier Interactive Patient Education  2017 [employer].

## 2017-03-10 LAB — CBC WITH DIFFERENTIAL/PLATELET
BASOS: 0 %
Basophils Absolute: 0 10*3/uL (ref 0.0–0.2)
EOS (ABSOLUTE): 0.1 10*3/uL (ref 0.0–0.4)
Eos: 3 %
Hematocrit: 41 % (ref 37.5–51.0)
Hemoglobin: 14 g/dL (ref 13.0–17.7)
Immature Grans (Abs): 0 10*3/uL (ref 0.0–0.1)
Immature Granulocytes: 0 %
Lymphocytes Absolute: 2.1 10*3/uL (ref 0.7–3.1)
Lymphs: 56 %
MCH: 27.3 pg (ref 26.6–33.0)
MCHC: 34.1 g/dL (ref 31.5–35.7)
MCV: 80 fL (ref 79–97)
MONOS ABS: 0.5 10*3/uL (ref 0.1–0.9)
Monocytes: 12 %
NEUTROS ABS: 1.1 10*3/uL — AB (ref 1.4–7.0)
Neutrophils: 29 %
PLATELETS: 252 10*3/uL (ref 150–379)
RBC: 5.13 x10E6/uL (ref 4.14–5.80)
RDW: 13.7 % (ref 12.3–15.4)
WBC: 3.7 10*3/uL (ref 3.4–10.8)

## 2017-03-10 LAB — COMPREHENSIVE METABOLIC PANEL
A/G RATIO: 1.5 (ref 1.2–2.2)
ALT: 19 IU/L (ref 0–44)
AST: 18 IU/L (ref 0–40)
Albumin: 4.5 g/dL (ref 3.5–5.5)
Alkaline Phosphatase: 62 IU/L (ref 39–117)
BUN/Creatinine Ratio: 15 (ref 9–20)
BUN: 15 mg/dL (ref 6–24)
CHLORIDE: 103 mmol/L (ref 96–106)
CO2: 26 mmol/L (ref 20–29)
Calcium: 9.8 mg/dL (ref 8.7–10.2)
Creatinine, Ser: 0.97 mg/dL (ref 0.76–1.27)
GFR calc non Af Amer: 95 mL/min/{1.73_m2} (ref >59)
GFR, EST AFRICAN AMERICAN: 109 mL/min/{1.73_m2} (ref >59)
Globulin, Total: 3.1 g/dL (ref 1.5–4.5)
Glucose: 121 mg/dL — ABNORMAL HIGH (ref 65–99)
POTASSIUM: 4.2 mmol/L (ref 3.5–5.2)
Sodium: 143 mmol/L (ref 134–144)
Total Protein: 7.6 g/dL (ref 6.0–8.5)

## 2017-03-10 LAB — LIPID PANEL
Chol/HDL Ratio: 6.1 ratio — ABNORMAL HIGH (ref 0.0–5.0)
Cholesterol, Total: 224 mg/dL — ABNORMAL HIGH (ref 100–199)
HDL: 37 mg/dL — ABNORMAL LOW (ref >39)
LDL Calculated: 162 mg/dL — ABNORMAL HIGH (ref 0–99)
Triglycerides: 124 mg/dL (ref 0–149)
VLDL Cholesterol Cal: 25 mg/dL (ref 5–40)

## 2018-01-03 ENCOUNTER — Ambulatory Visit: Payer: BLUE CROSS/BLUE SHIELD | Admitting: Family Medicine

## 2018-01-03 ENCOUNTER — Encounter: Payer: Self-pay | Admitting: Family Medicine

## 2018-01-03 ENCOUNTER — Other Ambulatory Visit: Payer: Self-pay

## 2018-01-03 VITALS — BP 134/75 | HR 72 | Temp 98.2°F | Ht 76.0 in | Wt 198.2 lb

## 2018-01-03 DIAGNOSIS — R21 Rash and other nonspecific skin eruption: Secondary | ICD-10-CM | POA: Diagnosis not present

## 2018-01-03 DIAGNOSIS — Z23 Encounter for immunization: Secondary | ICD-10-CM

## 2018-01-03 DIAGNOSIS — E785 Hyperlipidemia, unspecified: Secondary | ICD-10-CM | POA: Diagnosis not present

## 2018-01-03 DIAGNOSIS — E119 Type 2 diabetes mellitus without complications: Secondary | ICD-10-CM

## 2018-01-03 LAB — POCT GLYCOSYLATED HEMOGLOBIN (HGB A1C): HEMOGLOBIN A1C: 8.9 % — AB (ref 4.0–5.6)

## 2018-01-03 LAB — GLUCOSE, POCT (MANUAL RESULT ENTRY): POC GLUCOSE: 115 mg/dL — AB (ref 70–99)

## 2018-01-03 MED ORDER — BETAMETHASONE DIPROPIONATE 0.05 % EX CREA: TOPICAL_CREAM | Freq: Two times a day (BID) | CUTANEOUS | 0 refills | Status: DC

## 2018-01-03 MED ORDER — PANTOPRAZOLE SODIUM 40 MG PO TBEC: 40.0000 mg | DELAYED_RELEASE_TABLET | Freq: Every day | ORAL | 3 refills | Status: DC

## 2018-01-03 MED ORDER — DAPAGLIFLOZIN PROPANEDIOL 5 MG PO TABS: 5.0000 mg | ORAL_TABLET | Freq: Every day | ORAL | 2 refills | Status: DC

## 2018-01-03 MED ORDER — METFORMIN HCL 1000 MG PO TABS: 1000.0000 mg | ORAL_TABLET | Freq: Two times a day (BID) | ORAL | 2 refills | Status: DC

## 2018-01-03 MED ORDER — ATORVASTATIN CALCIUM 10 MG PO TABS: 10.0000 mg | ORAL_TABLET | Freq: Every day | ORAL | 2 refills | Status: DC

## 2018-01-03 NOTE — Patient Instructions (Addendum)
Start farxiga for diabetes, and see info below on nutrition.  Recheck in 6 weeks.  Return to the clinic or go to the nearest emergency room if any of your symptoms worsen or new symptoms occur.  I refilled the protonix, but would recommend meeting with Dr. Hilarie Fredrickson for follow up of heartburn symptoms.  Phone: [phone]  Try stronger steroid cream twice per day to knees, and Eucerin lotion 2-3 times per day.  If not improved in next 3-4 weeks, return for recheck or I can refer you to a dermatologist. Return to milder steroid (cortisone over the counter or triamcinolone) once improved.   Return to the clinic or go to the nearest emergency room if any of your symptoms worsen or new symptoms occur.   Psoriasis Psoriasis is a long-term (chronic) condition of skin inflammation. It occurs because your immune system causes skin cells to form too quickly. As a result, too many skin cells grow and create raised, red patches (plaques) that look silvery on your skin. Plaques may appear anywhere on your body. They can be any size or shape. Psoriasis can come and go. The condition varies from mild to very severe. It cannot be passed from one person to another (not contagious). What are the causes? The cause of psoriasis is not known, but certain factors can make the condition worse. These include:  Damage or trauma to the skin, such as cuts, scrapes, sunburn, and dryness.  Lack of sunlight.  Certain medicines.  Alcohol.  Tobacco use.  Stress.  Infections caused by bacteria or viruses.  What increases the risk? This condition is more likely to develop in:  People with a family history of psoriasis.  People who are Caucasian.  People who are between the ages of 15-67 and 31-59 years old.  What are the signs or symptoms? There are five different types of psoriasis. You can have more than one type of psoriasis during your life. Types  are:  Plaque.  Guttate.  Inverse.  Pustular.  Erythrodermic.  Each type of psoriasis has different symptoms.  Plaque psoriasis symptoms include red, raised plaques with a silvery white coating (scale). These plaques may be itchy. Your nails may be pitted and crumbly or fall off.  Guttate psoriasis symptoms include small red spots that often show up on your trunk, arms, and legs. These spots may develop after you have been sick, especially with strep throat.  Inverse psoriasis symptoms include plaques in your underarm area, under your breasts, or on your genitals, groin, or buttocks.  Pustular psoriasis symptoms include pus-filled bumps that are painful, red, and swollen on the palms of your hands or the soles of your feet. You also may feel exhausted, feverish, weak, or have no appetite.  Erythrodermic psoriasis symptoms include bright red skin that may look burned. You may have a fast heartbeat and a body temperature that is too high or too low. You may be itchy or in pain.  How is this diagnosed? Your health care provider may suspect psoriasis based on your symptoms and family history. Your health care provider will also do a physical exam. This may include a procedure to remove a tissue sample (biopsy) for testing. You may also be referred to a health care provider who specializes in skin diseases (dermatologist). How is this treated? There is no cure for this condition, but treatment can help manage it. Goals of treatment include:  Helping your skin heal.  Reducing itching and inflammation.  Slowing the growth of  new skin cells.  Helping your immune system respond better to your skin.  Treatment varies, depending on the severity of your condition. Treatment may include:  Creams or ointments.  Ultraviolet ray exposure (light therapy). This may include natural sunlight or light therapy in a medical office.  Medicines (systemic therapy). These medicines can help your body  better manage skin cell turnover and inflammation. They may be used along with light therapy or ointments. You may also get antibiotic medicines if you have an infection.  Follow these instructions at home: [place] your skin as needed. Only use moisturizers that have been approved by your health care provider.  Apply cool compresses to the affected areas.  Do not scratch your skin. Lifestyle   Do not use tobacco products. This includes cigarettes, chewing tobacco, and e-cigarettes. If you need help quitting, ask your health care provider.  Drink little or no alcohol.  Try techniques for stress reduction, such as meditation or yoga.  Get exposure to the sun as told by your health care provider. Do not get sunburned.  Consider joining a psoriasis support group. Medicines  Take or use over-the-counter and prescription medicines only as told by your health care provider.  If you were prescribed an antibiotic, take or use it as told by your health care provider. Do not stop taking the antibiotic even if your condition starts to improve. General instructions  Keep a journal to help track what triggers an outbreak. Try to avoid any triggers.  See a counselor or social worker if feelings of sadness, frustration, and hopelessness about your condition are interfering with your work and relationships.  Keep all follow-up visits as told by your health care provider. This is important. Contact a health care provider if:  Your pain gets worse.  You have increasing redness or warmth in the affected areas.  You have new or worsening pain or stiffness in your joints.  Your nails start to break easily or pull away from the nail bed.  You have a fever.  You feel depressed. This information is not intended to replace advice given to you by your health care provider. Make sure you discuss any questions you have with your health care provider. Document Released: 03/12/2000  Document Revised: 08/21/2015 Document Reviewed: 07/31/2014 Elsevier Interactive Patient Education  2018 [employer].    Type 2 Diabetes Mellitus, Self Care, Adult When you have type 2 diabetes (type 2 diabetes mellitus), you must keep your blood sugar (glucose) under control. You can do this with:  Nutrition.  Exercise.  Lifestyle changes.  Medicines or insulin, if needed.  Support from your doctors and others.  How do I manage my blood sugar?  Check your blood sugar level every day, as often as told.  Call your doctor if your blood sugar is above your goal numbers for 2 tests in a row.  Have your A1c (hemoglobin A1c) level checked at least two times a year. Have it checked more often if your doctor tells you to. Your doctor will set treatment goals for you. Generally, you should have these blood sugar levels:  Before meals (preprandial): 80-130 mg/dL (4.4-7.2 mmol/L).  After meals (postprandial): lower than 180 mg/dL (10 mmol/L).  A1c level: less than 7%.  What do I need to know about high blood sugar? High blood sugar is called hyperglycemia. Know the signs of high blood sugar. Signs may include:  Feeling: ? Thirsty. ? Hungry. ? Very tired.  Needing to pee (  urinate) more than usual.  Blurry vision.  What do I need to know about low blood sugar? Low blood sugar is called hypoglycemia. This is when blood sugar is at or below 70 mg/dL (3.9 mmol/L). Symptoms may include:  Feeling: ? Hungry. ? Worried or nervous (anxious). ? Sweaty and clammy. ? Confused. ? Dizzy. ? Sleepy. ? Sick to your stomach (nauseous).  Having: ? A fast heartbeat (palpitations). ? A headache. ? A change in your vision. ? Jerky movements that you cannot control (seizure). ? Nightmares. ? Tingling or no feeling (numbness) around the mouth, lips, or tongue.  Having trouble with: ? Talking. ? Paying attention (concentrating). ? Moving  (coordination). ? Sleeping.  Shaking.  Passing out (fainting).  Getting upset easily (irritability).  Treating low blood sugar  To treat low blood sugar, eat or drink something sugary right away. If you can think clearly and swallow safely, follow the 15:15 rule:  Take 15 grams of a fast-acting carb (carbohydrate). Some fast-acting carbs are: ? 1 tube of glucose gel. ? 3 sugar tablets (glucose pills). ? 6-8 pieces of hard candy. ? 4 oz (120 mL) of fruit juice. ? 4 oz (120 mL) regular (not diet) soda.  Check your blood sugar 15 minutes after you take the carb.  If your blood sugar is still at or below 70 mg/dL (3.9 mmol/L), take 15 grams of a carb again.  If your blood sugar does not go above 70 mg/dL (3.9 mmol/L) after 3 tries, get help right away.  After your blood sugar goes back to normal, eat a meal or a snack within 1 hour.  Treating very low blood sugar If your blood sugar is at or below 54 mg/dL (3 mmol/L), you have very low blood sugar (severe hypoglycemia). This is an emergency. Do not wait to see if the symptoms will go away. Get medical help right away. Call your local emergency services (911 in the [place] Do not drive yourself to the hospital. If you have very low blood sugar and you cannot eat or drink, you may need a glucagon shot (injection). A family member or friend should learn how to check your blood sugar and how to give you a glucagon shot. Ask your doctor if you need to have a glucagon shot kit at home. What else is important to manage my diabetes? Medicine Follow these instructions about insulin and diabetes medicines:  Take them as told by your doctor.  Adjust them as told by your doctor.  Do not run out of them.  Having diabetes can raise your risk for other long-term conditions. These include heart or kidney disease. Your doctor may prescribe medicines to help prevent problems from diabetes. Food   Make healthy food choices. These  include: ? Chicken, fish, egg whites, and beans. ? Oats, whole wheat, bulgur, brown rice, quinoa, and millet. ? Fresh fruits and vegetables. ? Low-fat dairy products. ? Nuts, avocado, olive oil, and canola oil.  Make a food plan with a specialist (dietitian).  Follow instructions from your doctor about what you cannot eat or drink.  Drink enough fluid to keep your pee (urine) clear or pale yellow.  Eat healthy snacks between healthy meals.  Keep track of carbs that you eat. Read food labels. Learn food serving sizes.  Follow your sick day plan when you cannot eat or drink normally. Make this plan with your doctor so it is ready to use. Activity  Exercise at least 3 times a week.  Do not go more than 2 days without exercising.  Talk with your doctor before you start a new exercise. Your doctor may need to adjust your insulin, medicines, or food. Lifestyle   Do not use any tobacco products. These include cigarettes, chewing tobacco, and e-cigarettes.If you need help quitting, ask your doctor.  Ask your doctor how much alcohol is safe for you.  Learn to deal with stress. If you need help with this, ask your doctor. Body care  Stay up to date with your shots (immunizations).  Have your eyes and feet checked by a doctor as often as told.  Check your skin and feet every day. Check for cuts, bruises, redness, blisters, or sores.  Brush your teeth and gums two times a day.  Floss at least one time a day.  Go to the dentist least one time every 6 months.  Stay at a healthy weight. General instructions   Take over-the-counter and prescription medicines only as told by your doctor.  Share your diabetes care plan with: ? Your work or school. ? People you live with.  Check your pee (urine) for ketones: ? When you are sick. ? As told by your doctor.  Carry a card or wear jewelry that says that you have diabetes.  Ask your doctor: ? Do I need to meet with a diabetes  educator? ? Where can I find a support group for people with diabetes?  Keep all follow-up visits as told by your doctor. This is important. Where to find more information: To learn more about diabetes, visit:  American Diabetes Association: www.diabetes.org  American Association of Diabetes Educators: www.diabeteseducator.org/patient-resources  This information is not intended to replace advice given to you by your health care provider. Make sure you discuss any questions you have with your health care provider. Document Released: 07/07/2015 Document Revised: 08/21/2015 Document Reviewed: 04/18/2015 Elsevier Interactive Patient Education  2018 [place] for Eating Away From Home If You Have Diabetes Controlling your level of blood glucose, also known as blood sugar, can be challenging. It can be even more difficult when you do not prepare your own meals. The following tips can help you manage your diabetes when you eat away from home. Planning ahead Plan ahead if you know you will be eating away from home:  Ask your health care provider how to time meals and medicine if you are taking insulin.  Make a list of restaurants near you that offer healthy choices. If they have a carry-out menu, take it home and plan what you will order ahead of time.  Look up the restaurant you want to eat at online. Many chain and fast-food restaurants list nutritional information online. Use this information to choose the healthiest options and to calculate how many carbohydrates will be in your meal.  Use a carbohydrate-counting book or mobile app to look up the carbohydrate content and serving size of the foods you want to eat.  Become familiar with serving sizes and learn to recognize how many servings are in a portion. This will allow you to estimate how many carbohydrates you can eat.  Free foods A "free food" is any food or drink that has less than 5 g of carbohydrates per serving. Free  foods include:  Many vegetables.  Hard boiled eggs.  Nuts or seeds.  Olives.  Cheeses.  Meats.  These types of foods make good appetizer choices and are often available at salad bars. Lemon juice, vinegar, or a  low-calorie salad dressing of fewer than 20 calories per serving can be used as a "free" salad dressing. Choices to reduce carbohydrates  Substitute nonfat sweetened yogurt with a sugar-free yogurt. Yogurt made from soy milk may also be used, but you will still want a sugar-free or plain option to choose a lower carbohydrate amount.  Ask your server to take away the bread basket or chips from your table.  Order fresh fruit. A salad bar often offers fresh fruit choices. Avoid canned fruit because it is usually packed in sugar or syrup.  Order a salad, and eat it without dressing. Or, create a "free" salad dressing.  Ask for substitutions. For example, instead of [place] fries, request an order of a vegetable such as salad, green beans, or broccoli. Other tips  If you take insulin, take the insulin once your food arrives to your table. This will ensure your insulin and food are timed correctly.  Ask your server about the portion size before your order, and ask for a take-out box if the portion has more servings than you should have. When your food comes, leave the amount you should have on the plate, and put the rest in the take-out box.  Consider splitting an entree with someone and ordering a side salad. This information is not intended to replace advice given to you by your health care provider. Make sure you discuss any questions you have with your health care provider. Document Released: 03/15/2005 Document Revised: 08/21/2015 Document Reviewed: 06/12/2013 Elsevier Interactive Patient Education  2018 [employer].   Diabetes Mellitus and Nutrition When you have diabetes (diabetes mellitus), it is very important to have healthy eating habits because your blood sugar  (glucose) levels are greatly affected by what you eat and drink. Eating healthy foods in the appropriate amounts, at about the same times every day, can help you:  Control your blood glucose.  Lower your risk of heart disease.  Improve your blood pressure.  Reach or maintain a healthy weight.  Every person with diabetes is different, and each person has different needs for a meal plan. Your health care provider may recommend that you work with a diet and nutrition specialist (dietitian) to make a meal plan that is best for you. Your meal plan may vary depending on factors such as:  The calories you need.  The medicines you take.  Your weight.  Your blood glucose, blood pressure, and cholesterol levels.  Your activity level.  Other health conditions you have, such as heart or kidney disease.  How do carbohydrates affect me? Carbohydrates affect your blood glucose level more than any other type of food. Eating carbohydrates naturally increases the amount of glucose in your blood. Carbohydrate counting is a method for keeping track of how many carbohydrates you eat. Counting carbohydrates is important to keep your blood glucose at a healthy level, especially if you use insulin or take certain oral diabetes medicines. It is important to know how many carbohydrates you can safely have in each meal. This is different for every person. Your dietitian can help you calculate how many carbohydrates you should have at each meal and for snack. Foods that contain carbohydrates include:  Bread, cereal, rice, pasta, and crackers.  Potatoes and corn.  Peas, beans, and lentils.  Milk and yogurt.  Fruit and juice.  Desserts, such as cakes, cookies, ice cream, and candy.  How does alcohol affect me? Alcohol can cause a sudden decrease in blood glucose (hypoglycemia), especially if you  use insulin or take certain oral diabetes medicines. Hypoglycemia can be a life-threatening condition.  Symptoms of hypoglycemia (sleepiness, dizziness, and confusion) are similar to symptoms of having too much alcohol. If your health care provider says that alcohol is safe for you, follow these guidelines:  Limit alcohol intake to no more than 1 drink per day for nonpregnant women and 2 drinks per day for men. One drink equals 12 oz of beer, 5 oz of wine, or 1 oz of hard liquor.  Do not drink on an empty stomach.  Keep yourself hydrated with water, diet soda, or unsweetened iced tea.  Keep in mind that regular soda, juice, and other mixers may contain a lot of sugar and must be counted as carbohydrates.  What are tips for following this plan? Reading food labels  Start by checking the serving size on the label. The amount of calories, carbohydrates, fats, and other nutrients listed on the label are based on one serving of the food. Many foods contain more than one serving per package.  Check the total grams (g) of carbohydrates in one serving. You can calculate the number of servings of carbohydrates in one serving by dividing the total carbohydrates by 15. For example, if a food has 30 g of total carbohydrates, it would be equal to 2 servings of carbohydrates.  Check the number of grams (g) of saturated and trans fats in one serving. Choose foods that have low or no amount of these fats.  Check the number of milligrams (mg) of sodium in one serving. Most people should limit total sodium intake to less than 2,300 mg per day.  Always check the nutrition information of foods labeled as "low-fat" or "nonfat". These foods may be higher in added sugar or refined carbohydrates and should be avoided.  Talk to your dietitian to identify your daily goals for nutrients listed on the label. Shopping  Avoid buying canned, premade, or processed foods. These foods tend to be high in fat, sodium, and added sugar.  Shop around the outside edge of the grocery store. This includes fresh fruits and  vegetables, bulk grains, fresh meats, and fresh dairy. Cooking  Use low-heat cooking methods, such as baking, instead of high-heat cooking methods like deep frying.  Cook using healthy oils, such as olive, canola, or sunflower oil.  Avoid cooking with butter, cream, or high-fat meats. Meal planning  Eat meals and snacks regularly, preferably at the same times every day. Avoid going long periods of time without eating.  Eat foods high in fiber, such as fresh fruits, vegetables, beans, and whole grains. Talk to your dietitian about how many servings of carbohydrates you can eat at each meal.  Eat 4-6 ounces of lean protein each day, such as lean meat, chicken, fish, eggs, or tofu. 1 ounce is equal to 1 ounce of meat, chicken, or fish, 1 egg, or 1/4 cup of tofu.  Eat some foods each day that contain healthy fats, such as avocado, nuts, seeds, and fish. Lifestyle   Check your blood glucose regularly.  Exercise at least 30 minutes 5 or more days each week, or as told by your health care provider.  Take medicines as told by your health care provider.  Do not use any products that contain nicotine or tobacco, such as cigarettes and e-cigarettes. If you need help quitting, ask your health care provider.  Work with a Social worker or diabetes educator to identify strategies to manage stress and any emotional and social challenges.  What are some questions to ask my health care provider?  Do I need to meet with a diabetes educator?  Do I need to meet with a dietitian?  What number can I call if I have questions?  When are the best times to check my blood glucose? Where to find more information:  American Diabetes Association: diabetes.org/food-and-fitness/food  Academy of Nutrition and Dietetics: PokerClues.dk  [employer] of Diabetes and Digestive and Kidney Diseases (NIH):  ContactWire.be Summary  A healthy meal plan will help you control your blood glucose and maintain a healthy lifestyle.  Working with a diet and nutrition specialist (dietitian) can help you make a meal plan that is best for you.  Keep in mind that carbohydrates and alcohol have immediate effects on your blood glucose levels. It is important to count carbohydrates and to use alcohol carefully. This information is not intended to replace advice given to you by your health care provider. Make sure you discuss any questions you have with your health care provider. Document Released: 12/10/2004 Document Revised: 04/19/2016 Document Reviewed: 04/19/2016 Elsevier Interactive Patient Education  [employer].   If you have lab work done today you will be contacted with your lab results within the next 2 weeks.  If you have not heard from [place] then please contact us. The fastest way to get your results is to register for My Chart.   IF you received an x-ray today, you will receive an invoice from [facility]. Please contact [facility] at [phone] with questions or concerns regarding your invoice.   IF you received labwork today, you will receive an invoice from [place]. Please contact [facility] at [phone] with questions or concerns regarding your invoice.   Our billing staff will not be able to assist you with questions regarding bills from these companies.  You will be contacted with the lab results as soon as they are available. The fastest way to get your results is to activate your My Chart account. Instructions are located on the last page of this paperwork. If you have not heard from [place] regarding the results in 2 weeks, please contact this office.

## 2018-01-03 NOTE — Progress Notes (Signed)
Subjective:  By signing my name below, I, Moises Blood, attest that this documentation has been prepared under the direction and in the presence of [name], MD. Electronically Signed: Moises Blood, [place]. 01/03/2018 , 5:47 PM .  Patient was seen in Room 10 .   Patient ID: Kevin Evans, male    DOB: 09-03-1970, 47 y.o.   MRN: [id number] Chief Complaint  Patient presents with  . Blood sugar test  . rash on both knees    more on right knee   HPI Kevin Evans is a 47 y.o. male  Here with 2 concerns: rash on his knees, and blood sugar test. He was last seen in Dec 2018. He has a history of diabetes, hyperlipidemia, and GERD. He agrees to flu shot today; usual reaction of arm becoming sore for a few days.   He hasn't come in due to his vacation between January to March, and he's a truck driver. He informs going back home to [place] from Jan 2nd, and return on March 31st.   Rash on knees Patient reports having itchy rash over bilateral knees, where the itchiness would wake him up at night. He's been applying lotion and previous prescribed triamcinolone cream but without much relief. He denies previously seeing a dermatologist.   Diabetes Lab Results  Component Value Date   HGBA1C 7.1 03/09/2017   At his last visit in Dec 2018, he was previously taking metformin 1000 mg bid. Recommended recheck in 3-6 months; this is his first visit back since Dec 2018. He mentions having more nocturia on some nights, but not every night. He denies blurry vision, abdominal pain, nausea, dysuria or fever. He denies chest pain, shortness of breath or dizziness. He mentions eating a tub of ice cream 2 days ago, and his sugar shot up. In a typical month, he may miss his medication 2-3 times.   Urine micro albumin creatinine: will be drawn today Optho: 03/05/2017 Foot:  Diabetic Foot Exam - Simple   Simple Foot Form Diabetic Foot exam was performed with the following findings:  Yes 01/03/2018  5:17 PM    Visual Inspection No deformities, no ulcerations, no other skin breakdown bilaterally:  Yes Sensation Testing Intact to touch and monofilament testing bilaterally:  Yes Pulse Check Posterior Tibialis and Dorsalis pulse intact bilaterally:  Yes Comments    Pneumovax: 07/01/15  Hyperlipidemia Lab Results  Component Value Date   CHOL 224 (H) 03/09/2017   HDL 37 (L) 03/09/2017   LDLCALC 162 (H) 03/09/2017   TRIG 124 03/09/2017   CHOLHDL 6.1 (H) 03/09/2017   Lab Results  Component Value Date   ALT 19 03/09/2017   AST 18 03/09/2017   ALKPHOS 62 03/09/2017   BILITOT <0.2 03/09/2017   He takes Lipitor 10 mg qd. Plan for 3-6 month follow for elevated LDL when seen in Dec 2018. In a typical month, he misses his medication about 2-3 times.    GERD He was prescribed protonix 40 mg qd. He notes taking this daily due to sensation of a knot in his throat. He had an endoscopy done by Dr. Hilarie Fredrickson on 04/01/15 for reflux esophagitis, and mild gastritis. He hasn't followed up with Dr. Hilarie Fredrickson recently. His acid reflux has improved, but the knot sensation is still present.    Patient Active Problem List   Diagnosis Date Noted  . Hyperlipidemia 03/09/2017  . Non-intractable vomiting with nausea 06/02/2016  . Viral illness 06/02/2016  . Malaise and fatigue 06/02/2016  . Type  2 diabetes mellitus without complication, without long-term current use of insulin ([place]) 04/08/2015   Past Medical History:  Diagnosis Date  . Diabetes mellitus without complication ([place])   . GERD (gastroesophageal reflux disease)   . HLD (hyperlipidemia)   . HTN (hypertension)    No past surgical history on file. Allergies  Allergen Reactions  . Bactrim [Sulfamethoxazole-Trimethoprim] Other (See Comments)    Itching all over body    Prior to Admission medications   Medication Sig Start Date End Date Taking? Authorizing Provider  atorvastatin (LIPITOR) 10 MG tablet Take 1 tablet (10 mg total) by mouth daily.  03/09/17   Horald Pollen, MD  Blood Glucose Monitoring Suppl w/Device KIT Monitor glucose twice daily 07/04/15   Ezekiel Slocumb, PA-C  clotrimazole (LOTRIMIN) 1 % external solution Apply 1 application topically 2 (two) times daily. (5 drops to R ear BID) 01/28/16   Wendie Agreste, MD  metFORMIN (GLUCOPHAGE) 1000 MG tablet Take 1 tablet (1,000 mg total) by mouth 2 (two) times daily with a meal. 03/09/17 06/07/17  Horald Pollen, MD  ondansetron (ZOFRAN ODT) 4 MG disintegrating tablet Take 1 tablet (4 mg total) by mouth every 8 (eight) hours as needed for nausea or vomiting. Patient not taking: Reported on 03/09/2017 05/31/16   Everlene Balls, MD  pantoprazole (PROTONIX) 40 MG tablet Take 1 tablet (40 mg total) by mouth daily. 03/09/17   Horald Pollen, MD  triamcinolone cream (KENALOG) 0.1 % Apply 1 application topically 2 (two) times daily. Patient not taking: Reported on 03/09/2017 12/10/15   Jaynee Eagles, PA-C   Social History   Socioeconomic History  . Marital status: Single    Spouse name: Not on file  . Number of children: 0  . Years of education: Not on file  . Highest education level: Not on file  Occupational History  . Occupation: truck Animator Needs  . Financial resource strain: Not on file  . Food insecurity:    Worry: Not on file    Inability: Not on file  . Transportation needs:    Medical: Not on file    Non-medical: Not on file  Tobacco Use  . Smoking status: Never Smoker  . Smokeless tobacco: Never Used  Substance and Sexual Activity  . Alcohol use: No    Alcohol/week: 0.0 standard drinks  . Drug use: No  . Sexual activity: Yes    Birth control/protection: Condom  Lifestyle  . Physical activity:    Days per week: Not on file    Minutes per session: Not on file  . Stress: Not on file  Relationships  . Social connections:    Talks on phone: Not on file    Gets together: Not on file    Attends religious service: Not on file    Active  member of club or organization: Not on file    Attends meetings of clubs or organizations: Not on file    Relationship status: Not on file  . Intimate partner violence:    Fear of current or ex partner: Not on file    Emotionally abused: Not on file    Physically abused: Not on file    Forced sexual activity: Not on file  Other Topics Concern  . Not on file  Social History Narrative  . Not on file   Review of Systems  Constitutional: Negative for fatigue and unexpected weight change.  Eyes: Negative for visual disturbance.  Respiratory: Negative for cough, chest  tightness and shortness of breath.   Cardiovascular: Negative for chest pain, palpitations and leg swelling.  Gastrointestinal: Negative for abdominal pain and blood in stool.  Genitourinary: Positive for frequency.  Skin: Positive for rash.  Neurological: Negative for dizziness, light-headedness and headaches.       Objective:   Physical Exam  Constitutional: He is oriented to person, place, and time. He appears well-developed and well-nourished. No distress.  HENT:  Head: Normocephalic and atraumatic.  Eyes: Pupils are equal, round, and reactive to light. EOM are normal.  Neck: Neck supple.  Cardiovascular: Normal rate.  Pulmonary/Chest: Effort normal. No respiratory distress.  Musculoskeletal: Normal range of motion.  Neurological: He is alert and oriented to person, place, and time.  Skin: Skin is warm and dry. Rash noted.  Right knee: over the extensor surface of the tibia tuberosity into the upper anterior tibia surface, there is some thickened scaled skin, no surrounding erythema no wounds Left knee: small area over the tibia tuberosity with similar appearance to the right knee  Psychiatric: He has a normal mood and affect. His behavior is normal.  Nursing note and vitals reviewed.   Vitals:   01/03/18 1801  BP: 134/75  Pulse: 72  Temp: 98.2 F (36.8 C)  TempSrc: Oral  SpO2: 98%  Weight: 198 lb 3.2 oz  (89.9 kg)  Height: '6\' 4"'$  (1.93 m)    Results for orders placed or performed in visit on 01/03/18  POCT glucose (manual entry)  Result Value Ref Range   POC Glucose 115 (A) 70 - 99 mg/dl       Assessment & Plan:    Kevin Evans is a 47 y.o. male Type 2 diabetes mellitus without complication, without long-term current use of insulin ([place]) - Plan: Microalbumin / creatinine urine ratio, POCT glucose (manual entry), POCT glycosylated hemoglobin (Hb A1C) Controlled type 2 diabetes mellitus without complication, without long-term current use of insulin ([place]) - Plan: metFORMIN (GLUCOPHAGE) 1000 MG tablet  -Decreased control, add SGLT2.  Initially prescribed Farxiga, changed to [facility] for coverage reasons, continue metformin same dose.  Recheck 6 weeks  Hyperlipidemia, unspecified hyperlipidemia type - Plan: Lipid panel, Comprehensive metabolic panel  -Check labs, continue same dose Lipitor for now.  Needs flu shot - Plan: Flu Vaccine QUAD 36+ mos IM   Rash - Plan: betamethasone dipropionate (DIPROLENE) 0.05 % cream   -Suspected psoriasis, increase potency of steroid temporarily, potential side effects discussed, once symptoms improved return to lower dose steroid. eucerin otc, and consider derm referral if persisitent.   PPI refilled, follow up with GI.   Meds ordered this encounter  Medications  . pantoprazole (PROTONIX) 40 MG tablet    Sig: Take 1 tablet (40 mg total) by mouth daily.    Dispense:  90 tablet    Refill:  3  . atorvastatin (LIPITOR) 10 MG tablet    Sig: Take 1 tablet (10 mg total) by mouth daily.    Dispense:  90 tablet    Refill:  2  . metFORMIN (GLUCOPHAGE) 1000 MG tablet    Sig: Take 1 tablet (1,000 mg total) by mouth 2 (two) times daily with a meal.    Dispense:  180 tablet    Refill:  2  . DISCONTD: dapagliflozin propanediol (FARXIGA) 5 MG TABS tablet    Sig: Take 5 mg by mouth daily.    Dispense:  30 tablet    Refill:  2  . betamethasone dipropionate  (DIPROLENE) 0.05 % cream  Sig: Apply topically 2 (two) times daily.    Dispense:  30 g    Refill:  0   Patient Instructions    Start farxiga for diabetes, and see info below on nutrition.  Recheck in 6 weeks.  Return to the clinic or go to the nearest emergency room if any of your symptoms worsen or new symptoms occur.  I refilled the protonix, but would recommend meeting with Dr. Hilarie Fredrickson for follow up of heartburn symptoms.  Phone: [phone]  Try stronger steroid cream twice per day to knees, and Eucerin lotion 2-3 times per day.  If not improved in next 3-4 weeks, return for recheck or I can refer you to a dermatologist. Return to milder steroid (cortisone over the counter or triamcinolone) once improved.   Return to the clinic or go to the nearest emergency room if any of your symptoms worsen or new symptoms occur.   Psoriasis Psoriasis is a long-term (chronic) condition of skin inflammation. It occurs because your immune system causes skin cells to form too quickly. As a result, too many skin cells grow and create raised, red patches (plaques) that look silvery on your skin. Plaques may appear anywhere on your body. They can be any size or shape. Psoriasis can come and go. The condition varies from mild to very severe. It cannot be passed from one person to another (not contagious). What are the causes? The cause of psoriasis is not known, but certain factors can make the condition worse. These include:  Damage or trauma to the skin, such as cuts, scrapes, sunburn, and dryness.  Lack of sunlight.  Certain medicines.  Alcohol.  Tobacco use.  Stress.  Infections caused by bacteria or viruses.  What increases the risk? This condition is more likely to develop in:  People with a family history of psoriasis.  People who are Caucasian.  People who are between the ages of 15-73 and 20-41 years old.  What are the signs or symptoms? There are five different types of  psoriasis. You can have more than one type of psoriasis during your life. Types are:  Plaque.  Guttate.  Inverse.  Pustular.  Erythrodermic.  Each type of psoriasis has different symptoms.  Plaque psoriasis symptoms include red, raised plaques with a silvery white coating (scale). These plaques may be itchy. Your nails may be pitted and crumbly or fall off.  Guttate psoriasis symptoms include small red spots that often show up on your trunk, arms, and legs. These spots may develop after you have been sick, especially with strep throat.  Inverse psoriasis symptoms include plaques in your underarm area, under your breasts, or on your genitals, groin, or buttocks.  Pustular psoriasis symptoms include pus-filled bumps that are painful, red, and swollen on the palms of your hands or the soles of your feet. You also may feel exhausted, feverish, weak, or have no appetite.  Erythrodermic psoriasis symptoms include bright red skin that may look burned. You may have a fast heartbeat and a body temperature that is too high or too low. You may be itchy or in pain.  How is this diagnosed? Your health care provider may suspect psoriasis based on your symptoms and family history. Your health care provider will also do a physical exam. This may include a procedure to remove a tissue sample (biopsy) for testing. You may also be referred to a health care provider who specializes in skin diseases (dermatologist). How is this treated? There is no cure for  this condition, but treatment can help manage it. Goals of treatment include:  Helping your skin heal.  Reducing itching and inflammation.  Slowing the growth of new skin cells.  Helping your immune system respond better to your skin.  Treatment varies, depending on the severity of your condition. Treatment may include:  Creams or ointments.  Ultraviolet ray exposure (light therapy). This may include natural sunlight or light therapy in a  medical office.  Medicines (systemic therapy). These medicines can help your body better manage skin cell turnover and inflammation. They may be used along with light therapy or ointments. You may also get antibiotic medicines if you have an infection.  Follow these instructions at home: [place] your skin as needed. Only use moisturizers that have been approved by your health care provider.  Apply cool compresses to the affected areas.  Do not scratch your skin. Lifestyle   Do not use tobacco products. This includes cigarettes, chewing tobacco, and e-cigarettes. If you need help quitting, ask your health care provider.  Drink little or no alcohol.  Try techniques for stress reduction, such as meditation or yoga.  Get exposure to the sun as told by your health care provider. Do not get sunburned.  Consider joining a psoriasis support group. Medicines  Take or use over-the-counter and prescription medicines only as told by your health care provider.  If you were prescribed an antibiotic, take or use it as told by your health care provider. Do not stop taking the antibiotic even if your condition starts to improve. General instructions  Keep a journal to help track what triggers an outbreak. Try to avoid any triggers.  See a counselor or social worker if feelings of sadness, frustration, and hopelessness about your condition are interfering with your work and relationships.  Keep all follow-up visits as told by your health care provider. This is important. Contact a health care provider if:  Your pain gets worse.  You have increasing redness or warmth in the affected areas.  You have new or worsening pain or stiffness in your joints.  Your nails start to break easily or pull away from the nail bed.  You have a fever.  You feel depressed. This information is not intended to replace advice given to you by your health care provider. Make sure you discuss any  questions you have with your health care provider. Document Released: 03/12/2000 Document Revised: 08/21/2015 Document Reviewed: 07/31/2014 Elsevier Interactive Patient Education  2018 [employer].    Type 2 Diabetes Mellitus, Self Care, Adult When you have type 2 diabetes (type 2 diabetes mellitus), you must keep your blood sugar (glucose) under control. You can do this with:  Nutrition.  Exercise.  Lifestyle changes.  Medicines or insulin, if needed.  Support from your doctors and others.  How do I manage my blood sugar?  Check your blood sugar level every day, as often as told.  Call your doctor if your blood sugar is above your goal numbers for 2 tests in a row.  Have your A1c (hemoglobin A1c) level checked at least two times a year. Have it checked more often if your doctor tells you to. Your doctor will set treatment goals for you. Generally, you should have these blood sugar levels:  Before meals (preprandial): 80-130 mg/dL (4.4-7.2 mmol/L).  After meals (postprandial): lower than 180 mg/dL (10 mmol/L).  A1c level: less than 7%.  What do I need to know about high blood sugar? High blood  sugar is called hyperglycemia. Know the signs of high blood sugar. Signs may include:  Feeling: ? Thirsty. ? Hungry. ? Very tired.  Needing to pee (urinate) more than usual.  Blurry vision.  What do I need to know about low blood sugar? Low blood sugar is called hypoglycemia. This is when blood sugar is at or below 70 mg/dL (3.9 mmol/L). Symptoms may include:  Feeling: ? Hungry. ? Worried or nervous (anxious). ? Sweaty and clammy. ? Confused. ? Dizzy. ? Sleepy. ? Sick to your stomach (nauseous).  Having: ? A fast heartbeat (palpitations). ? A headache. ? A change in your vision. ? Jerky movements that you cannot control (seizure). ? Nightmares. ? Tingling or no feeling (numbness) around the mouth, lips, or tongue.  Having trouble with: ? Talking. ? Paying  attention (concentrating). ? Moving (coordination). ? Sleeping.  Shaking.  Passing out (fainting).  Getting upset easily (irritability).  Treating low blood sugar  To treat low blood sugar, eat or drink something sugary right away. If you can think clearly and swallow safely, follow the 15:15 rule:  Take 15 grams of a fast-acting carb (carbohydrate). Some fast-acting carbs are: ? 1 tube of glucose gel. ? 3 sugar tablets (glucose pills). ? 6-8 pieces of hard candy. ? 4 oz (120 mL) of fruit juice. ? 4 oz (120 mL) regular (not diet) soda.  Check your blood sugar 15 minutes after you take the carb.  If your blood sugar is still at or below 70 mg/dL (3.9 mmol/L), take 15 grams of a carb again.  If your blood sugar does not go above 70 mg/dL (3.9 mmol/L) after 3 tries, get help right away.  After your blood sugar goes back to normal, eat a meal or a snack within 1 hour.  Treating very low blood sugar If your blood sugar is at or below 54 mg/dL (3 mmol/L), you have very low blood sugar (severe hypoglycemia). This is an emergency. Do not wait to see if the symptoms will go away. Get medical help right away. Call your local emergency services (911 in the [place] Do not drive yourself to the hospital. If you have very low blood sugar and you cannot eat or drink, you may need a glucagon shot (injection). A family member or friend should learn how to check your blood sugar and how to give you a glucagon shot. Ask your doctor if you need to have a glucagon shot kit at home. What else is important to manage my diabetes? Medicine Follow these instructions about insulin and diabetes medicines:  Take them as told by your doctor.  Adjust them as told by your doctor.  Do not run out of them.  Having diabetes can raise your risk for other long-term conditions. These include heart or kidney disease. Your doctor may prescribe medicines to help prevent problems from diabetes. Food   Make  healthy food choices. These include: ? Chicken, fish, egg whites, and beans. ? Oats, whole wheat, bulgur, brown rice, quinoa, and millet. ? Fresh fruits and vegetables. ? Low-fat dairy products. ? Nuts, avocado, olive oil, and canola oil.  Make a food plan with a specialist (dietitian).  Follow instructions from your doctor about what you cannot eat or drink.  Drink enough fluid to keep your pee (urine) clear or pale yellow.  Eat healthy snacks between healthy meals.  Keep track of carbs that you eat. Read food labels. Learn food serving sizes.  Follow your sick day plan when you  cannot eat or drink normally. Make this plan with your doctor so it is ready to use. Activity  Exercise at least 3 times a week.  Do not go more than 2 days without exercising.  Talk with your doctor before you start a new exercise. Your doctor may need to adjust your insulin, medicines, or food. Lifestyle   Do not use any tobacco products. These include cigarettes, chewing tobacco, and e-cigarettes.If you need help quitting, ask your doctor.  Ask your doctor how much alcohol is safe for you.  Learn to deal with stress. If you need help with this, ask your doctor. Body care  Stay up to date with your shots (immunizations).  Have your eyes and feet checked by a doctor as often as told.  Check your skin and feet every day. Check for cuts, bruises, redness, blisters, or sores.  Brush your teeth and gums two times a day.  Floss at least one time a day.  Go to the dentist least one time every 6 months.  Stay at a healthy weight. General instructions   Take over-the-counter and prescription medicines only as told by your doctor.  Share your diabetes care plan with: ? Your work or school. ? People you live with.  Check your pee (urine) for ketones: ? When you are sick. ? As told by your doctor.  Carry a card or wear jewelry that says that you have diabetes.  Ask your doctor: ? Do I  need to meet with a diabetes educator? ? Where can I find a support group for people with diabetes?  Keep all follow-up visits as told by your doctor. This is important. Where to find more information: To learn more about diabetes, visit:  American Diabetes Association: www.diabetes.org  American Association of Diabetes Educators: www.diabeteseducator.org/patient-resources  This information is not intended to replace advice given to you by your health care provider. Make sure you discuss any questions you have with your health care provider. Document Released: 07/07/2015 Document Revised: 08/21/2015 Document Reviewed: 04/18/2015 Elsevier Interactive Patient Education  2018 [place] for Eating Away From Home If You Have Diabetes Controlling your level of blood glucose, also known as blood sugar, can be challenging. It can be even more difficult when you do not prepare your own meals. The following tips can help you manage your diabetes when you eat away from home. Planning ahead Plan ahead if you know you will be eating away from home:  Ask your health care provider how to time meals and medicine if you are taking insulin.  Make a list of restaurants near you that offer healthy choices. If they have a carry-out menu, take it home and plan what you will order ahead of time.  Look up the restaurant you want to eat at online. Many chain and fast-food restaurants list nutritional information online. Use this information to choose the healthiest options and to calculate how many carbohydrates will be in your meal.  Use a carbohydrate-counting book or mobile app to look up the carbohydrate content and serving size of the foods you want to eat.  Become familiar with serving sizes and learn to recognize how many servings are in a portion. This will allow you to estimate how many carbohydrates you can eat.  Free foods A "free food" is any food or drink that has less than 5 g of  carbohydrates per serving. Free foods include:  Many vegetables.  Hard boiled eggs.  Nuts or seeds.  Olives.  Cheeses.  Meats.  These types of foods make good appetizer choices and are often available at salad bars. Lemon juice, vinegar, or a low-calorie salad dressing of fewer than 20 calories per serving can be used as a "free" salad dressing. Choices to reduce carbohydrates  Substitute nonfat sweetened yogurt with a sugar-free yogurt. Yogurt made from soy milk may also be used, but you will still want a sugar-free or plain option to choose a lower carbohydrate amount.  Ask your server to take away the bread basket or chips from your table.  Order fresh fruit. A salad bar often offers fresh fruit choices. Avoid canned fruit because it is usually packed in sugar or syrup.  Order a salad, and eat it without dressing. Or, create a "free" salad dressing.  Ask for substitutions. For example, instead of [place] fries, request an order of a vegetable such as salad, green beans, or broccoli. Other tips  If you take insulin, take the insulin once your food arrives to your table. This will ensure your insulin and food are timed correctly.  Ask your server about the portion size before your order, and ask for a take-out box if the portion has more servings than you should have. When your food comes, leave the amount you should have on the plate, and put the rest in the take-out box.  Consider splitting an entree with someone and ordering a side salad. This information is not intended to replace advice given to you by your health care provider. Make sure you discuss any questions you have with your health care provider. Document Released: 03/15/2005 Document Revised: 08/21/2015 Document Reviewed: 06/12/2013 Elsevier Interactive Patient Education  2018 [employer].   Diabetes Mellitus and Nutrition When you have diabetes (diabetes mellitus), it is very important to have healthy eating  habits because your blood sugar (glucose) levels are greatly affected by what you eat and drink. Eating healthy foods in the appropriate amounts, at about the same times every day, can help you:  Control your blood glucose.  Lower your risk of heart disease.  Improve your blood pressure.  Reach or maintain a healthy weight.  Every person with diabetes is different, and each person has different needs for a meal plan. Your health care provider may recommend that you work with a diet and nutrition specialist (dietitian) to make a meal plan that is best for you. Your meal plan may vary depending on factors such as:  The calories you need.  The medicines you take.  Your weight.  Your blood glucose, blood pressure, and cholesterol levels.  Your activity level.  Other health conditions you have, such as heart or kidney disease.  How do carbohydrates affect me? Carbohydrates affect your blood glucose level more than any other type of food. Eating carbohydrates naturally increases the amount of glucose in your blood. Carbohydrate counting is a method for keeping track of how many carbohydrates you eat. Counting carbohydrates is important to keep your blood glucose at a healthy level, especially if you use insulin or take certain oral diabetes medicines. It is important to know how many carbohydrates you can safely have in each meal. This is different for every person. Your dietitian can help you calculate how many carbohydrates you should have at each meal and for snack. Foods that contain carbohydrates include:  Bread, cereal, rice, pasta, and crackers.  Potatoes and corn.  Peas, beans, and lentils.  Milk and yogurt.  Fruit and juice.  Desserts, such  as cakes, cookies, ice cream, and candy.  How does alcohol affect me? Alcohol can cause a sudden decrease in blood glucose (hypoglycemia), especially if you use insulin or take certain oral diabetes medicines. Hypoglycemia can be a  life-threatening condition. Symptoms of hypoglycemia (sleepiness, dizziness, and confusion) are similar to symptoms of having too much alcohol. If your health care provider says that alcohol is safe for you, follow these guidelines:  Limit alcohol intake to no more than 1 drink per day for nonpregnant women and 2 drinks per day for men. One drink equals 12 oz of beer, 5 oz of wine, or 1 oz of hard liquor.  Do not drink on an empty stomach.  Keep yourself hydrated with water, diet soda, or unsweetened iced tea.  Keep in mind that regular soda, juice, and other mixers may contain a lot of sugar and must be counted as carbohydrates.  What are tips for following this plan? Reading food labels  Start by checking the serving size on the label. The amount of calories, carbohydrates, fats, and other nutrients listed on the label are based on one serving of the food. Many foods contain more than one serving per package.  Check the total grams (g) of carbohydrates in one serving. You can calculate the number of servings of carbohydrates in one serving by dividing the total carbohydrates by 15. For example, if a food has 30 g of total carbohydrates, it would be equal to 2 servings of carbohydrates.  Check the number of grams (g) of saturated and trans fats in one serving. Choose foods that have low or no amount of these fats.  Check the number of milligrams (mg) of sodium in one serving. Most people should limit total sodium intake to less than 2,300 mg per day.  Always check the nutrition information of foods labeled as "low-fat" or "nonfat". These foods may be higher in added sugar or refined carbohydrates and should be avoided.  Talk to your dietitian to identify your daily goals for nutrients listed on the label. Shopping  Avoid buying canned, premade, or processed foods. These foods tend to be high in fat, sodium, and added sugar.  Shop around the outside edge of the grocery store. This  includes fresh fruits and vegetables, bulk grains, fresh meats, and fresh dairy. Cooking  Use low-heat cooking methods, such as baking, instead of high-heat cooking methods like deep frying.  Cook using healthy oils, such as olive, canola, or sunflower oil.  Avoid cooking with butter, cream, or high-fat meats. Meal planning  Eat meals and snacks regularly, preferably at the same times every day. Avoid going long periods of time without eating.  Eat foods high in fiber, such as fresh fruits, vegetables, beans, and whole grains. Talk to your dietitian about how many servings of carbohydrates you can eat at each meal.  Eat 4-6 ounces of lean protein each day, such as lean meat, chicken, fish, eggs, or tofu. 1 ounce is equal to 1 ounce of meat, chicken, or fish, 1 egg, or 1/4 cup of tofu.  Eat some foods each day that contain healthy fats, such as avocado, nuts, seeds, and fish. Lifestyle   Check your blood glucose regularly.  Exercise at least 30 minutes 5 or more days each week, or as told by your health care provider.  Take medicines as told by your health care provider.  Do not use any products that contain nicotine or tobacco, such as cigarettes and e-cigarettes. If you need help  quitting, ask your health care provider.  Work with a Social worker or diabetes educator to identify strategies to manage stress and any emotional and social challenges. What are some questions to ask my health care provider?  Do I need to meet with a diabetes educator?  Do I need to meet with a dietitian?  What number can I call if I have questions?  When are the best times to check my blood glucose? Where to find more information:  American Diabetes Association: diabetes.org/food-and-fitness/food  Academy of Nutrition and Dietetics: PokerClues.dk  [employer] of Diabetes and Digestive and Kidney Diseases (NIH):  ContactWire.be Summary  A healthy meal plan will help you control your blood glucose and maintain a healthy lifestyle.  Working with a diet and nutrition specialist (dietitian) can help you make a meal plan that is best for you.  Keep in mind that carbohydrates and alcohol have immediate effects on your blood glucose levels. It is important to count carbohydrates and to use alcohol carefully. This information is not intended to replace advice given to you by your health care provider. Make sure you discuss any questions you have with your health care provider. Document Released: 12/10/2004 Document Revised: 04/19/2016 Document Reviewed: 04/19/2016 Elsevier Interactive Patient Education  [employer].   If you have lab work done today you will be contacted with your lab results within the next 2 weeks.  If you have not heard from [place] then please contact us. The fastest way to get your results is to register for My Chart.   IF you received an x-ray today, you will receive an invoice from [facility] Radiology. Please contact [facility] Radiology at [phone] with questions or concerns regarding your invoice.   IF you received labwork today, you will receive an invoice from [place]. Please contact [facility] at [phone] with questions or concerns regarding your invoice.   Our billing staff will not be able to assist you with questions regarding bills from these companies.  You will be contacted with the lab results as soon as they are available. The fastest way to get your results is to activate your My Chart account. Instructions are located on the last page of this paperwork. If you have not heard from [place] regarding the results in 2 weeks, please contact this office.      I personally performed the services described in this documentation, which was scribed in my presence. The recorded information has been  reviewed and considered for accuracy and completeness, addended by me as needed, and agree with information above.  Signed,   [name], MD [facility] at [place].  01/07/18 2:22 PM

## 2018-01-04 ENCOUNTER — Telehealth: Payer: Self-pay | Admitting: Family Medicine

## 2018-01-04 LAB — LIPID PANEL
CHOLESTEROL TOTAL: 149 mg/dL (ref 100–199)
Chol/HDL Ratio: 4.4 ratio (ref 0.0–5.0)
HDL: 34 mg/dL — ABNORMAL LOW (ref >39)
LDL Calculated: 80 mg/dL (ref 0–99)
TRIGLYCERIDES: 173 mg/dL — AB (ref 0–149)
VLDL Cholesterol Cal: 35 mg/dL (ref 5–40)

## 2018-01-04 LAB — COMPREHENSIVE METABOLIC PANEL
ALBUMIN: 4.7 g/dL (ref 3.5–5.5)
ALT: 20 IU/L (ref 0–44)
AST: 15 IU/L (ref 0–40)
Albumin/Globulin Ratio: 1.6 (ref 1.2–2.2)
Alkaline Phosphatase: 64 IU/L (ref 39–117)
BUN / CREAT RATIO: 13 (ref 9–20)
BUN: 14 mg/dL (ref 6–24)
Bilirubin Total: <0.2 mg/dL (ref 0.0–1.2)
CALCIUM: 10 mg/dL (ref 8.7–10.2)
CO2: 25 mmol/L (ref 20–29)
CREATININE: 1.05 mg/dL (ref 0.76–1.27)
Chloride: 99 mmol/L (ref 96–106)
GFR, EST AFRICAN AMERICAN: 99 mL/min/{1.73_m2} (ref >59)
GFR, EST NON AFRICAN AMERICAN: 85 mL/min/{1.73_m2} (ref >59)
GLOBULIN, TOTAL: 3 g/dL (ref 1.5–4.5)
Glucose: 120 mg/dL — ABNORMAL HIGH (ref 65–99)
Potassium: 4.2 mmol/L (ref 3.5–5.2)
SODIUM: 140 mmol/L (ref 134–144)
TOTAL PROTEIN: 7.7 g/dL (ref 6.0–8.5)

## 2018-01-04 LAB — MICROALBUMIN / CREATININE URINE RATIO
CREATININE, UR: 225.2 mg/dL
MICROALB/CREAT RATIO: 8.9 mg/g{creat} (ref 0.0–30.0)
MICROALBUM., U, RANDOM: 20.1 ug/mL

## 2018-01-04 NOTE — Telephone Encounter (Signed)
Copied from [place] [id number]. Topic: Quick Communication - Rx Refill/Question >> Jan 04, 2018  1:16 PM Margot Ables wrote: Medication: dapagliflozin propanediol (FARXIGA) 5 MG TABS tablet - pt states PA is required and pharmacy sent a fax to Dr. Carlota Raspberry - please f/u with p  Preferred Pharmacy (with phone number or street name): [place]

## 2018-01-04 NOTE — Telephone Encounter (Signed)
Patient called back to say that he spoke with someone at the Pharmacy that is wanting more supporting documents from Dr Carlota Raspberry for the medication order to be filled.

## 2018-01-05 NOTE — Telephone Encounter (Signed)
Patient will be leaving in the early afternoon for work and would like an alternate medication called in right away.

## 2018-01-05 NOTE — Telephone Encounter (Signed)
Messages sent to Dr. Carlota Raspberry. Pt's Kevin Evans was denied  Pt requesting alternative.  [facility] [address].

## 2018-01-05 NOTE — Telephone Encounter (Signed)
Received a fax that his medication [place]) was denied by the insurance company. Denial letter is in the providers box.

## 2018-01-06 MED ORDER — EMPAGLIFLOZIN 10 MG PO TABS: 10.0000 mg | ORAL_TABLET | Freq: Every day | ORAL | 2 refills | Status: DC

## 2018-01-06 NOTE — Telephone Encounter (Signed)
Sent Jardiance - if that is not covered let me know.

## 2018-01-07 ENCOUNTER — Encounter: Payer: Self-pay | Admitting: Family Medicine

## 2018-02-14 ENCOUNTER — Ambulatory Visit: Payer: BLUE CROSS/BLUE SHIELD | Admitting: Family Medicine

## 2018-02-14 ENCOUNTER — Other Ambulatory Visit: Payer: Self-pay

## 2018-02-14 ENCOUNTER — Encounter: Payer: Self-pay | Admitting: Family Medicine

## 2018-02-14 VITALS — BP 122/80 | HR 70 | Temp 98.4°F | Ht 76.0 in | Wt 193.8 lb

## 2018-02-14 DIAGNOSIS — E119 Type 2 diabetes mellitus without complications: Secondary | ICD-10-CM | POA: Diagnosis not present

## 2018-02-14 LAB — GLUCOSE, POCT (MANUAL RESULT ENTRY): POC GLUCOSE: 76 mg/dL (ref 70–99)

## 2018-02-14 MED ORDER — EMPAGLIFLOZIN 10 MG PO TABS: 10.0000 mg | ORAL_TABLET | Freq: Every day | ORAL | 1 refills | Status: DC

## 2018-02-14 NOTE — Progress Notes (Signed)
Subjective:  By signing my name below, I, Kevin Evans, attest that this documentation has been prepared under the direction and in the presence of Kevin Ray, MD. Electronically Signed: Moises Evans, [place]. 02/14/2018 , 11:09 AM .  Patient was seen in Room 11 .   Patient ID: Kevin Evans, male    DOB: 1970-04-05, 47 y.o.   MRN: [id number] Chief Complaint  Patient presents with  . Diabetes    6 w f/u    HPI Kevin Evans is a 47 y.o. male Here for follow up of diabetes. His last visit was on Oct 8th. At last visit, he mentioned having rash over his knees; today reports his rash has improved with prescribed diprolene 0.05% cream.   Diabetes He had decreased control when discussed at last visit; added Farxiga, and changed to [place] due to coverage issues. He was continued on metformin.   Lab Results  Component Value Date   HGBA1C 8.9 (A) 01/03/2018   Patient states he's doing well on Jardiance. He was taking it at night, but felt a little dizzy with it. So, he started taking it with breakfast without any further dizziness. He's been checking his sugars at home, with last 3 readings at 80, 90, and 112.   Lab Results  Component Value Date   CREATININE 1.05 01/03/2018    Patient Active Problem List   Diagnosis Date Noted  . Hyperlipidemia 03/09/2017  . Non-intractable vomiting with nausea 06/02/2016  . Viral illness 06/02/2016  . Malaise and fatigue 06/02/2016  . Type 2 diabetes mellitus without complication, without long-term current use of insulin ([place]) 04/08/2015   Past Medical History:  Diagnosis Date  . Diabetes mellitus without complication ([place])   . GERD (gastroesophageal reflux disease)   . HLD (hyperlipidemia)   . HTN (hypertension)    No past surgical history on file. Allergies  Allergen Reactions  . Bactrim [Sulfamethoxazole-Trimethoprim] Other (See Comments)    Itching all over body    Prior to Admission medications   Medication Sig Start Date End  Date Taking? Authorizing Provider  atorvastatin (LIPITOR) 10 MG tablet Take 1 tablet (10 mg total) by mouth daily. 01/03/18   Wendie Agreste, MD  betamethasone dipropionate (DIPROLENE) 0.05 % cream Apply topically 2 (two) times daily. 01/03/18   Wendie Agreste, MD  Evans Glucose Monitoring Suppl w/Device KIT Monitor glucose twice daily 07/04/15   Ezekiel Slocumb, PA-C  clotrimazole (LOTRIMIN) 1 % external solution Apply 1 application topically 2 (two) times daily. (5 drops to R ear BID) 01/28/16   Wendie Agreste, MD  empagliflozin (JARDIANCE) 10 MG TABS tablet Take 10 mg by mouth daily. 01/06/18   Wendie Agreste, MD  metFORMIN (GLUCOPHAGE) 1000 MG tablet Take 1 tablet (1,000 mg total) by mouth 2 (two) times daily with a meal. 01/03/18 04/03/18  Wendie Agreste, MD  pantoprazole (PROTONIX) 40 MG tablet Take 1 tablet (40 mg total) by mouth daily. 01/03/18   Wendie Agreste, MD   Social History   Socioeconomic History  . Marital status: Single    Spouse name: Not on file  . Number of children: 0  . Years of education: Not on file  . Highest education level: Not on file  Occupational History  . Occupation: truck Animator Needs  . Financial resource strain: Not on file  . Food insecurity:    Worry: Not on file    Inability: Not on file  . Transportation needs:  Medical: Not on file    Non-medical: Not on file  Tobacco Use  . Smoking status: Never Smoker  . Smokeless tobacco: Never Used  Substance and Sexual Activity  . Alcohol use: No    Alcohol/week: 0.0 standard drinks  . Drug use: No  . Sexual activity: Yes    Birth control/protection: Condom  Lifestyle  . Physical activity:    Days per week: Not on file    Minutes per session: Not on file  . Stress: Not on file  Relationships  . Social connections:    Talks on phone: Not on file    Gets together: Not on file    Attends religious service: Not on file    Active member of club or organization: Not on file     Attends meetings of clubs or organizations: Not on file    Relationship status: Not on file  . Intimate partner violence:    Fear of current or ex partner: Not on file    Emotionally abused: Not on file    Physically abused: Not on file    Forced sexual activity: Not on file  Other Topics Concern  . Not on file  Social History Narrative  . Not on file   Review of Systems  Constitutional: Negative for fatigue and unexpected weight change.  Eyes: Negative for visual disturbance.  Respiratory: Negative for cough, chest tightness and shortness of breath.   Cardiovascular: Negative for chest pain, palpitations and leg swelling.  Gastrointestinal: Negative for abdominal pain and Evans in stool.  Skin: Negative for rash.  Neurological: Negative for dizziness, light-headedness and headaches.       Objective:   Physical Exam  Constitutional: He is oriented to person, place, and time. He appears well-developed and well-nourished. No distress.  HENT:  Head: Normocephalic and atraumatic.  Eyes: Pupils are equal, round, and reactive to light. EOM are normal.  Neck: Neck supple.  Cardiovascular: Normal rate.  Pulmonary/Chest: Effort normal. No respiratory distress.  Musculoskeletal: Normal range of motion.  Neurological: He is alert and oriented to person, place, and time.  Skin: Skin is warm and dry.  Psychiatric: He has a normal mood and affect. His behavior is normal.  Nursing note and vitals reviewed.   Vitals:   02/14/18 1028 02/14/18 1031  BP: (!) 164/70 122/80  Pulse: 70   Temp: 98.4 F (36.9 C)   TempSrc: Oral   SpO2: 98%   Weight: 193 lb 12.8 oz (87.9 kg)   Height: '6\' 4"'$  (1.93 m)        Assessment & Plan:   Kevin Evans is a 47 y.o. male Type 2 diabetes mellitus without complication, without long-term current use of insulin ([place]) - Plan: POCT glucose (manual entry), empagliflozin (JARDIANCE) 10 MG TABS tablet   - tolerating current regimen. Recheck in 2 months for  repeat A1c.   Meds ordered this encounter  Medications  . empagliflozin (JARDIANCE) 10 MG TABS tablet    Sig: Take 10 mg by mouth daily.    Dispense:  90 tablet    Refill:  1   Patient Instructions   I'm glad the new medication is working.  No changes for now.  Follow up with me in January for repeat A1c testing. Let me know if there are questions prior to that time.   If you have lab work done today you will be contacted with your lab results within the next 2 weeks.  If you have not heard from  [place] then please contact us. The fastest way to get your results is to register for My Chart.   IF you received an x-Evans today, you will receive an invoice from [facility] Radiology. Please contact [facility] Radiology at [phone] with questions or concerns regarding your invoice.   IF you received labwork today, you will receive an invoice from [place]. Please contact [facility] at [phone] with questions or concerns regarding your invoice.   Our billing staff will not be able to assist you with questions regarding bills from these companies.  You will be contacted with the lab results as soon as they are available. The fastest way to get your results is to activate your My Chart account. Instructions are located on the last page of this paperwork. If you have not heard from [place] regarding the results in 2 weeks, please contact this office.       I personally performed the services described in this documentation, which was scribed in my presence. The recorded information has been reviewed and considered for accuracy and completeness, addended by me as needed, and agree with information above.  Signed,   Kevin Ray, MD Primary Care at [place].  02/18/18 3:50 PM

## 2018-02-14 NOTE — Patient Instructions (Addendum)
I'm glad the new medication is working.  No changes for now.  Follow up with me in January for repeat A1c testing. Let me know if there are questions prior to that time.   If you have lab work done today you will be contacted with your lab results within the next 2 weeks.  If you have not heard from [place] then please contact us. The fastest way to get your results is to register for My Chart.   IF you received an x-ray today, you will receive an invoice from [facility] Radiology. Please contact [facility] Radiology at [phone] with questions or concerns regarding your invoice.   IF you received labwork today, you will receive an invoice from [place]. Please contact [facility] at [phone] with questions or concerns regarding your invoice.   Our billing staff will not be able to assist you with questions regarding bills from these companies.  You will be contacted with the lab results as soon as they are available. The fastest way to get your results is to activate your My Chart account. Instructions are located on the last page of this paperwork. If you have not heard from [place] regarding the results in 2 weeks, please contact this office.

## 2018-02-18 ENCOUNTER — Encounter: Payer: Self-pay | Admitting: Family Medicine

## 2018-04-02 ENCOUNTER — Other Ambulatory Visit: Payer: Self-pay | Admitting: Family Medicine

## 2018-04-02 DIAGNOSIS — E119 Type 2 diabetes mellitus without complications: Secondary | ICD-10-CM

## 2018-04-11 ENCOUNTER — Encounter: Payer: Self-pay | Admitting: Family Medicine

## 2018-04-15 ENCOUNTER — Ambulatory Visit: Payer: BLUE CROSS/BLUE SHIELD | Admitting: Family Medicine

## 2018-04-17 ENCOUNTER — Ambulatory Visit: Payer: BLUE CROSS/BLUE SHIELD | Admitting: Family Medicine

## 2018-04-17 ENCOUNTER — Encounter: Payer: Self-pay | Admitting: Family Medicine

## 2018-04-17 ENCOUNTER — Other Ambulatory Visit: Payer: Self-pay

## 2018-04-17 VITALS — BP 126/76 | HR 74 | Temp 98.3°F | Resp 14 | Ht 76.0 in | Wt 198.0 lb

## 2018-04-17 VITALS — BP 126/76 | HR 74 | Temp 98.3°F | Resp 14 | Ht 76.0 in | Wt 198.2 lb

## 2018-04-17 DIAGNOSIS — Z1329 Encounter for screening for other suspected endocrine disorder: Secondary | ICD-10-CM

## 2018-04-17 DIAGNOSIS — Z130 Encounter for screening for diseases of the blood and blood-forming organs and certain disorders involving the immune mechanism: Secondary | ICD-10-CM

## 2018-04-17 DIAGNOSIS — K219 Gastro-esophageal reflux disease without esophagitis: Secondary | ICD-10-CM | POA: Diagnosis not present

## 2018-04-17 DIAGNOSIS — E119 Type 2 diabetes mellitus without complications: Secondary | ICD-10-CM | POA: Diagnosis not present

## 2018-04-17 DIAGNOSIS — Z024 Encounter for examination for driving license: Secondary | ICD-10-CM

## 2018-04-17 DIAGNOSIS — Z1321 Encounter for screening for nutritional disorder: Secondary | ICD-10-CM

## 2018-04-17 DIAGNOSIS — Z79899 Other long term (current) drug therapy: Secondary | ICD-10-CM | POA: Diagnosis not present

## 2018-04-17 DIAGNOSIS — E785 Hyperlipidemia, unspecified: Secondary | ICD-10-CM

## 2018-04-17 DIAGNOSIS — Z13228 Encounter for screening for other metabolic disorders: Secondary | ICD-10-CM

## 2018-04-17 LAB — POCT GLYCOSYLATED HEMOGLOBIN (HGB A1C): HEMOGLOBIN A1C: 6.7 % — AB (ref 4.0–5.6)

## 2018-04-17 LAB — GLUCOSE, POCT (MANUAL RESULT ENTRY): POC GLUCOSE: 143 mg/dL — AB (ref 70–99)

## 2018-04-17 MED ORDER — EMPAGLIFLOZIN 10 MG PO TABS: 10.0000 mg | ORAL_TABLET | Freq: Every day | ORAL | 1 refills | Status: DC

## 2018-04-17 MED ORDER — PANTOPRAZOLE SODIUM 40 MG PO TBEC: 40.0000 mg | DELAYED_RELEASE_TABLET | Freq: Every day | ORAL | 3 refills | Status: DC

## 2018-04-17 MED ORDER — METFORMIN HCL 1000 MG PO TABS: 1000.0000 mg | ORAL_TABLET | Freq: Two times a day (BID) | ORAL | 2 refills | Status: DC

## 2018-04-17 MED ORDER — BLOOD GLUCOSE METER KIT: PACK | 0 refills | Status: DC

## 2018-04-17 MED ORDER — ATORVASTATIN CALCIUM 10 MG PO TABS: 10.0000 mg | ORAL_TABLET | Freq: Every day | ORAL | 2 refills | Status: DC

## 2018-04-17 NOTE — Progress Notes (Signed)
I, [employer], acting as a Education administrator for Wendie Agreste, MD, have documented all relevant documentation on the behalf of Wendie Agreste, MD, as directed by Wendie Agreste, MD while in the presence of Wendie Agreste, MD.    Subjective:    Patient ID: Kevin Evans, male    DOB: [birth year], 48 y.o.   MRN: [id number]  Chief Complaint  Patient presents with  . DOT Physical     HPI  Kevin Evans is a 48 y.o. male who presents to Primary Care at [facility] for a DOT physical.  Diabetes: Diabetes medications were changed for improved control Denies symptomatic lows/ hypoglycemia  Lab Results  Component Value Date   HGBA1C 6.7 (A) 04/17/2018   HGBA1C 8.9 (A) 01/03/2018   HGBA1C 7.1 03/09/2017   Lab Results  Component Value Date   MICROALBUR 4.3 04/03/2015   LDLCALC 80 01/03/2018   CREATININE 1.05 01/03/2018    No chest pain with exercise or exertion. No OSA/ CPAP hx.  No fatigue during the day or behind the wheel.  No muscular or arthritic restrictions. Pt wears glasses and wears to drive.  Upset stomach from the past has resolved.   Review of Systems DOT from reviewed     Objective:   Physical Exam Vitals signs reviewed.  Constitutional:      Appearance: He is well-developed.  HENT:     Head: Normocephalic and atraumatic.     Right Ear: External ear normal.     Left Ear: External ear normal.  Eyes:     Conjunctiva/sclera: Conjunctivae normal.     Pupils: Pupils are equal, round, and reactive to light.  Neck:     Musculoskeletal: Normal range of motion and neck supple.     Thyroid: No thyromegaly.  Cardiovascular:     Rate and Rhythm: Normal rate and regular rhythm.     Heart sounds: Normal heart sounds.  Pulmonary:     Effort: Pulmonary effort is normal. No respiratory distress.     Breath sounds: Normal breath sounds. No wheezing.  Abdominal:     General: There is no distension.     Palpations: Abdomen is soft.     Tenderness: There is no abdominal  tenderness.     Hernia: There is no hernia in the right inguinal area or left inguinal area.  Musculoskeletal: Normal range of motion.        General: No tenderness.  Lymphadenopathy:     Cervical: No cervical adenopathy.  Skin:    General: Skin is warm and dry.  Neurological:     Mental Status: He is alert and oriented to person, place, and time.     Deep Tendon Reflexes: Reflexes are normal and symmetric.  Psychiatric:        Behavior: Behavior normal.       Results for orders placed or performed in visit on 04/17/18  POCT glucose (manual entry)  Result Value Ref Range   POC Glucose 143 (A) 70 - 99 mg/dl  POCT glycosylated hemoglobin (Hb A1C)  Result Value Ref Range   Hemoglobin A1C 6.7 (A) 4.0 - 5.6 %   HbA1c POC (<> result, manual entry)     HbA1c, POC (prediabetic range)     HbA1c, POC (controlled diabetic range)       Vitals:   04/17/18 0956  BP: 126/76  Pulse: 74  Resp: 14  Temp: 98.3 F (36.8 C)  TempSrc: Oral  SpO2: 99%  Weight: 198  lb (89.8 kg)  Height: 6\' 4"  (1.93 m)     Visual Acuity Screening   Right eye Left eye Both eyes  Without correction:     With correction: 20/13 20/15 20/15   Comments: TITMUS-pass 85  COLOR-6/6-pass  Hearing Screening Comments: Whisper 30ft -Pass        Assessment & Plan:  Kevin Evans is a 48 y.o. 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  No concerns on exam or history otherwise.  See DOT paperwork  No orders of the defined types were placed in this encounter.  Patient Instructions    1 year card for DOT.    If you have lab work done today you will be contacted with your lab results within the next 2 weeks.  If you have not heard from [place] then please contact us. The fastest way to get your results is to register for My Chart.   IF you received an x-ray today, you will receive an invoice from [facility] Radiology. Please  contact [facility] Radiology at [phone] with questions or concerns regarding your invoice.   IF you received labwork today, you will receive an invoice from [place]. Please contact [facility] at [phone] with questions or concerns regarding your invoice.   Our billing staff will not be able to assist you with questions regarding bills from these companies.  You will be contacted with the lab results as soon as they are available. The fastest way to get your results is to activate your My Chart account. Instructions are located on the last page of this paperwork. If you have not heard from [place] regarding the results in 2 weeks, please contact this office.       I personally performed the services described in this documentation, which was scribed in my presence. The recorded information has been reviewed and considered for accuracy and completeness, addended by me as needed, and agree with information above.  Signed,   [name], MD Primary Care at [place].  04/18/18 8:20 PM

## 2018-04-17 NOTE — Patient Instructions (Addendum)
  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    If you have lab work done today you will be contacted with your lab results within the next 2 weeks.  If you have not heard from [place] then please contact us. The fastest way to get your results is to register for My Chart.   IF you received an x-ray today, you will receive an invoice from [facility]. Please contact [facility] at [phone] with questions or concerns regarding your invoice.   IF you received labwork today, you will receive an invoice from [place]. Please contact [facility] at [phone] with questions or concerns regarding your invoice.   Our billing staff will not be able to assist you with questions regarding bills from these companies.  You will be contacted with the lab results as soon as they are available. The fastest way to get your results is to activate your My Chart account. Instructions are located on the last page of this paperwork. If you have not heard from [place] regarding the results in 2 weeks, please contact this office.

## 2018-04-17 NOTE — Progress Notes (Signed)
I, [employer], acting as a Education administrator for Wendie Agreste, MD, have documented all relevant documentation on the behalf of Wendie Agreste, MD, as directed by Wendie Agreste, MD while in the presence of Wendie Agreste, MD.    Subjective:    Patient ID: Kevin Evans, male    DOB: [birth year], 48 y.o.   MRN: [id number]  Chief Complaint  Patient presents with  . Diabetes    here to f/u on new diabetes medication and get A1c checked    HPI Kevin Evans is a 48 y.o. male who presents to Primary Care at [facility] for a follow up of his diabetes. Last seen on 02/14/2018.    Diabetes He had been changes to Jardiance. Continued on metformin. He is on a statin.  He has had no new side effects of these medications  Home blood sugars have been improving His lowest subjectively measured blood sugar is 77, asymptomatic. No symptomatic lows.  Pt requests new glucometer.   Lab Results  Component Value Date   HGBA1C 8.9 (A) 01/03/2018    Microalbumin/ Creatinine Normal 01/13/2018   Opthalmology  exam 03/08/17. Opthalmology appointment was due since 03/08/2018.    He is up to date on foot exam and pneumonia vaccination.    GERD GI: Zenovia Jarred, MD Mild gastritis 2017 with endoscopy. Takes Protonix 20 mg pd. Controlling sx's.    Hyperlipidemia: Lab Results  Component Value Date   CHOL 149 01/03/2018   HDL 34 (L) 01/03/2018   LDLCALC 80 01/03/2018   TRIG 173 (H) 01/03/2018   CHOLHDL 4.4 01/03/2018   Lab Results  Component Value Date   ALT 20 01/03/2018   AST 15 01/03/2018   ALKPHOS 64 01/03/2018   BILITOT <0.2 01/03/2018   On lipitor.   Review of Systems  Constitutional: Negative for fatigue and unexpected weight change.  Eyes: Negative for visual disturbance.  Respiratory: Negative for cough, chest tightness and shortness of breath.   Cardiovascular: Negative for chest pain, palpitations and leg swelling.  Gastrointestinal: Negative for abdominal pain and blood in stool.   Neurological: Negative for dizziness, light-headedness and headaches.   Vitals:   04/17/18 0913  BP: 126/76  Pulse: 74  Resp: 14  Temp: 98.3 F (36.8 C)  TempSrc: Oral  SpO2: 99%  Weight: 198 lb 3.2 oz (89.9 kg)  Height: _0  (1.93 m)         Objective:   Physical Exam Vitals signs reviewed.  Constitutional:      Appearance: He is well-developed.  HENT:     Head: Normocephalic and atraumatic.  Eyes:     Pupils: Pupils are equal, round, and reactive to light.  Neck:     Vascular: No carotid bruit or JVD.  Cardiovascular:     Rate and Rhythm: Normal rate and regular rhythm.     Heart sounds: Normal heart sounds. No murmur.  Pulmonary:     Effort: Pulmonary effort is normal.     Breath sounds: Normal breath sounds. No rales.  Skin:    General: Skin is warm and dry.  Neurological:     Mental Status: He is alert and oriented to person, place, and time.       Assessment & Plan:   Kevin Evans is a 48 y.o. male Type 2 diabetes mellitus without complication, without long-term current use of insulin ([place]) - Plan: empagliflozin (JARDIANCE) 10 MG TABS tablet, POCT glucose (manual entry), POCT glycosylated hemoglobin (Hb A1C), blood  glucose meter kit and supplies, CANCELED: Hemoglobin A1c Controlled type 2 diabetes mellitus without complication, without long-term current use of insulin (HCC) - Plan: metFORMIN (GLUCOPHAGE) 1000 MG tablet, DISCONTINUED: metFORMIN (GLUCOPHAGE) 1000 MG tablet  -Improved control, no hypoglycemia.  Can continue same regimen but if lower readings at home, crease metformin to half pill twice per day, or hold Jardiance.  Recheck 6 months  Use of proton pump inhibitor therapy - Plan: pantoprazole (PROTONIX) 40 MG tablet, Vitamin B12, VITAMIN D 25 Hydroxy (Vit-D Deficiency, Fractures), Magnesium Screening for endocrine, nutritional, metabolic and immunity disorder - Plan: Vitamin B12, VITAMIN D 25 Hydroxy (Vit-D Deficiency, Fractures),  Magnesium Gastroesophageal reflux disease, esophagitis presence not specified - Plan: pantoprazole (PROTONIX) 40 MG tablet  -Continue Protonix same dose, screen for deficiencies as above  Hyperlipidemia, unspecified hyperlipidemia type - Plan: atorvastatin (LIPITOR) 10 MG tablet  -Tolerating Lipitor, no change.   Meds ordered this encounter  Medications  . empagliflozin (JARDIANCE) 10 MG TABS tablet    Sig: Take 10 mg by mouth daily.    Dispense:  90 tablet    Refill:  1  . pantoprazole (PROTONIX) 40 MG tablet    Sig: Take 1 tablet (40 mg total) by mouth daily.    Dispense:  90 tablet    Refill:  3  . DISCONTD: metFORMIN (GLUCOPHAGE) 1000 MG tablet    Sig: Take 1 tablet (1,000 mg total) by mouth 2 (two) times daily with a meal.    Dispense:  180 tablet    Refill:  2  . atorvastatin (LIPITOR) 10 MG tablet    Sig: Take 1 tablet (10 mg total) by mouth daily.    Dispense:  90 tablet    Refill:  2  . blood glucose meter kit and supplies    Sig: Dispense based on patient and insurance preference.check up to once per day.    Dispense:  1 each    Refill:  0    Order Specific Question:   Number of strips    Answer:   100    Order Specific Question:   Number of lancets    Answer:   100  . metFORMIN (GLUCOPHAGE) 1000 MG tablet    Sig: Take 1 tablet (1,000 mg total) by mouth 2 (two) times daily with a meal.    Dispense:  180 tablet    Refill:  2   Patient Instructions    Diabetes control is much better.  Okay to stay on same dose of medications for now but if you notice any low blood sugar readings stop the Jardiance or can decrease metformin to 1/2 pill twice per day.  Recheck levels in the next 3 to 6 months.  Let me know if there are questions prior to that time.    If you have lab work done today you will be contacted with your lab results within the next 2 weeks.  If you have not heard from [place] then please contact us. The fastest way to get your results is to register for My  Chart.   IF you received an x-ray today, you will receive an invoice from [facility]. Please contact [facility] at [phone] with questions or concerns regarding your invoice.   IF you received labwork today, you will receive an invoice from [place]. Please contact [facility] at [phone] with questions or concerns regarding your invoice.   Our billing staff will not be able to assist you with questions regarding bills from these companies.  You will be contacted with the lab results as soon as they are available. The fastest way to get your results is to activate your My Chart account. Instructions are located on the last page of this paperwork. If you have not heard from [place] regarding the results in 2 weeks, please contact this office.       I personally performed the services described in this documentation, which was scribed in my presence. The recorded information has been reviewed and considered for accuracy and completeness, addended by me as needed, and agree with information above.  Signed,   [name], MD Primary Care at [place].  04/17/18 11:20 AM

## 2018-04-17 NOTE — Patient Instructions (Addendum)
  Diabetes control is much better.  Okay to stay on same dose of medications for now but if you notice any low blood sugar readings stop the Jardiance or can decrease metformin to 1/2 pill twice per day.  Recheck levels in the next 3 to 6 months.  Let me know if there are questions prior to that time.    If you have lab work done today you will be contacted with your lab results within the next 2 weeks.  If you have not heard from [place] then please contact us. The fastest way to get your results is to register for My Chart.   IF you received an x-ray today, you will receive an invoice from [facility]. Please contact [facility] Radiology at [phone] with questions or concerns regarding your invoice.   IF you received labwork today, you will receive an invoice from [place]. Please contact [facility] at [phone] with questions or concerns regarding your invoice.   Our billing staff will not be able to assist you with questions regarding bills from these companies.  You will be contacted with the lab results as soon as they are available. The fastest way to get your results is to activate your My Chart account. Instructions are located on the last page of this paperwork. If you have not heard from [place] regarding the results in 2 weeks, please contact this office.

## 2018-04-18 LAB — VITAMIN D 25 HYDROXY (VIT D DEFICIENCY, FRACTURES): Vit D, 25-Hydroxy: 21.1 ng/mL — ABNORMAL LOW (ref 30.0–100.0)

## 2018-04-18 LAB — MAGNESIUM: Magnesium: 2 mg/dL (ref 1.6–2.3)

## 2018-04-18 LAB — VITAMIN B12: Vitamin B-12: 562 pg/mL (ref 232–1245)

## 2018-10-16 ENCOUNTER — Ambulatory Visit: Payer: BLUE CROSS/BLUE SHIELD | Admitting: Family Medicine

## 2018-10-17 ENCOUNTER — Encounter: Payer: Self-pay | Admitting: Family Medicine

## 2019-02-04 ENCOUNTER — Encounter: Payer: Self-pay | Admitting: Family Medicine

## 2019-02-08 NOTE — Telephone Encounter (Signed)
Called pt to schedule appt. "call could not be completed at this time. Please hang up and try again later"

## 2019-06-16 ENCOUNTER — Other Ambulatory Visit: Payer: Self-pay | Admitting: Family Medicine

## 2019-06-16 DIAGNOSIS — E119 Type 2 diabetes mellitus without complications: Secondary | ICD-10-CM

## 2019-06-18 ENCOUNTER — Other Ambulatory Visit: Payer: Self-pay | Admitting: Family Medicine

## 2019-06-18 DIAGNOSIS — Z79899 Other long term (current) drug therapy: Secondary | ICD-10-CM

## 2019-06-18 DIAGNOSIS — K219 Gastro-esophageal reflux disease without esophagitis: Secondary | ICD-10-CM

## 2019-06-18 DIAGNOSIS — E119 Type 2 diabetes mellitus without complications: Secondary | ICD-10-CM

## 2019-06-25 ENCOUNTER — Telehealth: Payer: Self-pay | Admitting: Family Medicine

## 2019-06-25 ENCOUNTER — Other Ambulatory Visit: Payer: Self-pay

## 2019-06-25 DIAGNOSIS — E119 Type 2 diabetes mellitus without complications: Secondary | ICD-10-CM

## 2019-06-25 MED ORDER — METFORMIN HCL 1000 MG PO TABS: 1000.0000 mg | ORAL_TABLET | Freq: Two times a day (BID) | ORAL | 0 refills | Status: DC

## 2019-06-25 MED ORDER — JARDIANCE 10 MG PO TABS: 10.0000 mg | ORAL_TABLET | Freq: Every day | ORAL | 0 refills | Status: DC

## 2019-06-25 NOTE — Telephone Encounter (Signed)
Courtesy refill sent. Pt needs appointment for more refills

## 2019-06-25 NOTE — Telephone Encounter (Signed)
Pt called and stated when he called in to his pharmacy they stated he needed to sch with his provider to get refills on these medications. Pt is wanting to see if he could get a courtesy refill. Pt appt is sch on 4/12. Please advise [phone]  metFORMIN (GLUCOPHAGE) 1000 MG tablet [id number]  empagliflozin (JARDIANCE) 10 MG TABS tablet [id number]

## 2019-06-27 ENCOUNTER — Other Ambulatory Visit: Payer: Self-pay | Admitting: Family Medicine

## 2019-06-27 DIAGNOSIS — E119 Type 2 diabetes mellitus without complications: Secondary | ICD-10-CM

## 2019-06-27 NOTE — Telephone Encounter (Signed)
Courtesy refill until appointment  

## 2019-07-09 ENCOUNTER — Ambulatory Visit (INDEPENDENT_AMBULATORY_CARE_PROVIDER_SITE_OTHER): Payer: 59 | Admitting: Family Medicine

## 2019-07-09 ENCOUNTER — Encounter: Payer: Self-pay | Admitting: Family Medicine

## 2019-07-09 ENCOUNTER — Other Ambulatory Visit: Payer: Self-pay

## 2019-07-09 VITALS — BP 134/84 | HR 80 | Ht 76.0 in | Wt 198.4 lb

## 2019-07-09 DIAGNOSIS — E785 Hyperlipidemia, unspecified: Secondary | ICD-10-CM | POA: Diagnosis not present

## 2019-07-09 DIAGNOSIS — E119 Type 2 diabetes mellitus without complications: Secondary | ICD-10-CM | POA: Diagnosis not present

## 2019-07-09 DIAGNOSIS — L409 Psoriasis, unspecified: Secondary | ICD-10-CM

## 2019-07-09 MED ORDER — JARDIANCE 10 MG PO TABS: 10.0000 mg | ORAL_TABLET | Freq: Every day | ORAL | 2 refills | Status: DC

## 2019-07-09 MED ORDER — METFORMIN HCL 1000 MG PO TABS: ORAL_TABLET | ORAL | 2 refills | Status: DC

## 2019-07-09 MED ORDER — TRIAMCINOLONE ACETONIDE 0.1 % EX CREA: 1.0000 "application " | TOPICAL_CREAM | Freq: Two times a day (BID) | CUTANEOUS | 1 refills | Status: DC

## 2019-07-09 MED ORDER — ATORVASTATIN CALCIUM 10 MG PO TABS: 10.0000 mg | ORAL_TABLET | Freq: Every day | ORAL | 2 refills | Status: DC

## 2019-07-09 NOTE — Patient Instructions (Signed)
No change in meds for now.  Follow up if steroid bream not working for psoriasis.  If heartburn returns, can try prilosec over the counter.  Recheck in 32months.   Return to the clinic or go to the nearest emergency room if any of your symptoms worsen or new symptoms occur.      If you have lab work done today you will be contacted with your lab results within the next 2 weeks.  If you have not heard from [place] then please contact us. The fastest way to get your results is to register for My Chart.   IF you received an x-ray today, you will receive an invoice from [facility] Radiology. Please contact [facility]iology at [phone] with questions or concerns regarding your invoice.   IF you received labwork today, you will receive an invoice from [place]. Please contact [facility] at [phone] with questions or concerns regarding your invoice.   Our billing staff will not be able to assist you with questions regarding bills from these companies.  You will be contacted with the lab results as soon as they are available. The fastest way to get your results is to activate your My Chart account. Instructions are located on the last page of this paperwork. If you have not heard from [place] regarding the results in 2 weeks, please contact this office.

## 2019-07-09 NOTE — Progress Notes (Signed)
Subjective:  Patient ID: Kevin Evans, male    DOB: 01-31-1971  Age: 49 y.o. MRN: [id number]  CC:  Chief Complaint  Patient presents with  . Diabetes  . Pruritis    wants refill on itch cream for knee     HPI Kevin Evans presents for   Diabetes: Prior controlled. Last A1c over a year ago.  Was in [place], had to remain for in [place] for 9 months. Has 47yo and 47yo dtrs' in [place] - able to spend more time with daughters.  Treated with jardiance '10mg'$ , metformin '1000mg'$  BID. Has been back on meds since November. Off meds for about 6 months overseas. A1c 7.0 in November for job. Better diet in [place].  Microalbumin: nl ratio in 2019.  Optho, foot exam, pneumovax: due, referrals placed. optho 2 months ago - no apparent issues. Unsure if diabetic check. [facility] in [place].   Diabetic Foot Exam - Simple   No data filed     Lab Results  Component Value Date   HGBA1C 6.7 (A) 04/17/2018   HGBA1C 8.9 (A) 01/03/2018   HGBA1C 7.1 03/09/2017   Lab Results  Component Value Date   MICROALBUR 4.3 04/03/2015   [place] 80 01/03/2018   CREATININE 1.05 01/03/2018   Psoriasis: L knee.  Itching. Flared recently. Used otc lotion.  Triamcinolone in past.   No recent PPi need. Avoiding triggers. feeling in throat in past - doing ok recently.   Hyperlipidemia: Off statin past 7-8 months.  Lab Results  Component Value Date   CHOL 149 01/03/2018   HDL 34 (L) 01/03/2018   LDLCALC 80 01/03/2018   TRIG 173 (H) 01/03/2018   CHOLHDL 4.4 01/03/2018   Lab Results  Component Value Date   ALT 20 01/03/2018   AST 15 01/03/2018   ALKPHOS 64 01/03/2018   BILITOT <0.2 01/03/2018      History Patient Active Problem List   Diagnosis Date Noted  . Hyperlipidemia 03/09/2017  . Non-intractable vomiting with nausea 06/02/2016  . Viral illness 06/02/2016  . Malaise and fatigue 06/02/2016  . Type 2 diabetes mellitus without complication, without long-term current use of insulin ([place])  04/08/2015   Past Medical History:  Diagnosis Date  . Diabetes mellitus without complication ([place])   . GERD (gastroesophageal reflux disease)   . HLD (hyperlipidemia)   . HTN (hypertension)    History reviewed. No pertinent surgical history. Allergies  Allergen Reactions  . Bactrim [Sulfamethoxazole-Trimethoprim] Other (See Comments)    Itching all over body    Prior to Admission medications   Medication Sig Start Date End Date Taking? Authorizing Provider  blood glucose meter kit and supplies Dispense based on patient and insurance preference.check up to once per day. 04/17/18  Yes Wendie Agreste, MD  Blood Glucose Monitoring Suppl w/Device KIT Monitor glucose twice daily 07/04/15  Yes Ezekiel Slocumb, PA-C  JARDIANCE 10 MG TABS tablet TAKE 1 TABLET BY MOUTH DAILY 06/27/19  Yes Wendie Agreste, MD  metFORMIN (GLUCOPHAGE) 1000 MG tablet TAKE 1 TABLET(1000 MG) BY MOUTH TWICE DAILY WITH A MEAL 06/27/19  Yes Wendie Agreste, MD  atorvastatin (LIPITOR) 10 MG tablet Take 1 tablet (10 mg total) by mouth daily. Patient not taking: Reported on 07/09/2019 04/17/18   Wendie Agreste, MD  betamethasone dipropionate (DIPROLENE) 0.05 % cream Apply topically 2 (two) times daily. Patient not taking: Reported on 07/09/2019 01/03/18   Wendie Agreste, MD  clotrimazole (LOTRIMIN) 1 % external solution Apply 1 application  topically 2 (two) times daily. (5 drops to R ear BID) Patient not taking: Reported on 07/09/2019 01/28/16   Wendie Agreste, MD   Social History   Socioeconomic History  . Marital status: Single    Spouse name: Not on file  . Number of children: 0  . Years of education: Not on file  . Highest education level: Not on file  Occupational History  . Occupation: truck Geophysicist/field seismologist  Tobacco Use  . Smoking status: Never Smoker  . Smokeless tobacco: Never Used  Substance and Sexual Activity  . Alcohol use: No    Alcohol/week: 0.0 standard drinks  . Drug use: No  . Sexual activity: Yes      Birth control/protection: Condom  Other Topics Concern  . Not on file  Social History Narrative  . Not on file   Social Determinants of Health   Financial Resource Strain:   . Difficulty of Paying Living Expenses:   Food Insecurity:   . Worried About Charity fundraiser in the Last Year:   . Arboriculturist in the Last Year:   Transportation Needs:   . Film/video editor (Medical):   Marland Kitchen Lack of Transportation (Non-Medical):   Physical Activity:   . Days of Exercise per Week:   . Minutes of Exercise per Session:   Stress:   . Feeling of Stress :   Social Connections:   . Frequency of Communication with Friends and Family:   . Frequency of Social Gatherings with Friends and Family:   . Attends Religious Services:   . Active Member of Clubs or Organizations:   . Attends Archivist Meetings:   Marland Kitchen Marital Status:   Intimate Partner Violence:   . Fear of Current or Ex-Partner:   . Emotionally Abused:   Marland Kitchen Physically Abused:   . Sexually Abused:     Review of Systems  Constitutional: Negative for fatigue and unexpected weight change.  Eyes: Negative for visual disturbance.  Respiratory: Negative for cough, chest tightness and shortness of breath.   Cardiovascular: Negative for chest pain, palpitations and leg swelling.  Gastrointestinal: Negative for abdominal pain and blood in stool.  Neurological: Negative for dizziness, light-headedness and headaches.     Objective:   Vitals:   07/09/19 1622  BP: 134/84  Pulse: 80  SpO2: 100%  Weight: 198 lb 6.4 oz (90 kg)  Height: '6\' 4"'$  (1.93 m)     Physical Exam Vitals reviewed.  Constitutional:      Appearance: He is well-developed.  HENT:     Head: Normocephalic and atraumatic.  Eyes:     Pupils: Pupils are equal, round, and reactive to light.  Neck:     Vascular: No carotid bruit or JVD.  Cardiovascular:     Rate and Rhythm: Normal rate and regular rhythm.     Heart sounds: Normal heart sounds. No  murmur.  Pulmonary:     Effort: Pulmonary effort is normal.     Breath sounds: Normal breath sounds. No rales.  Skin:    General: Skin is warm and dry.     Comments: Scaling, thickened skin with excoriation L > R tibial tuberosity.   Neurological:     Mental Status: He is alert and oriented to person, place, and time.     Assessment & Plan:  Benjerman Molinelli is a 49 y.o. male . Controlled type 2 diabetes mellitus without complication, without long-term current use of insulin ([place]) - Plan: Lipid panel, CMP14+EGFR,  Microalbumin / creatinine urine ratio, HM DIABETES FOOT EXAM, Hemoglobin A1c, metFORMIN (GLUCOPHAGE) 1000 MG tablet, CANCELED: Ambulatory referral to Ophthalmology  Hyperlipidemia, unspecified hyperlipidemia type - Plan: Lipid panel, CMP14+EGFR, atorvastatin (LIPITOR) 10 MG tablet  Psoriasis - Plan: triamcinolone cream (KENALOG) 0.1 %  Type 2 diabetes mellitus without complication, without long-term current use of insulin (HCC) - Plan: empagliflozin (JARDIANCE) 10 MG TABS tablet   Refill Jardiance, Metformin for diabetes, check A1c, other labs as above.  Suspect improvement with prior reading of meds and travel back from [place].  Still appears to be area of psoriasis on left greater than right tibial tuberosity.  Triamcinolone refilled, may need to increase potency of steroid if not improving.  Check lipids, restart Lipitor. Meds ordered this encounter  Medications  . atorvastatin (LIPITOR) 10 MG tablet    Sig: Take 1 tablet (10 mg total) by mouth daily.    Dispense:  90 tablet    Refill:  2  . metFORMIN (GLUCOPHAGE) 1000 MG tablet    Sig: TAKE 1 TABLET(1000 MG) BY MOUTH TWICE DAILY WITH A MEAL    Dispense:  180 tablet    Refill:  2  . triamcinolone cream (KENALOG) 0.1 %    Sig: Apply 1 application topically 2 (two) times daily.    Dispense:  30 g    Refill:  1  . empagliflozin (JARDIANCE) 10 MG TABS tablet    Sig: Take 10 mg by mouth daily.    Dispense:  90 tablet     Refill:  2   Patient Instructions   No change in meds for now.  Follow up if steroid bream not working for psoriasis.  If heartburn returns, can try prilosec over the counter.  Recheck in 11month.   Return to the clinic or go to the nearest emergency room if any of your symptoms worsen or new symptoms occur.      If you have lab work done today you will be contacted with your lab results within the next 2 weeks.  If you have not heard from [place] please contact [place] The fastest way to get your results is to register for My Chart.   IF you received an x-ray today, you will receive an [facility]. Please contact [facility] at [phone] questions or concerns regarding your invoice.   IF you received labwork today, you will receive an invoice from [place] Please contact [facility] at [phone] questions or concerns regarding your invoice.   Our billing staff will not be able to assist you with questions regarding bills from these companies.  You will be contacted with the lab results as soon as they are available. The fastest way to get your results is to activate your My Chart account. Instructions are located on the last page of this paperwork. If you have not heard from [place] the results in 2 weeks, please contact this office.           Signed,   JMerri Ray MD [facility]

## 2019-07-10 ENCOUNTER — Encounter: Payer: Self-pay | Admitting: Family Medicine

## 2019-07-10 LAB — MICROALBUMIN / CREATININE URINE RATIO
Creatinine, Urine: 161.4 mg/dL
Microalb/Creat Ratio: 14 mg/g creat (ref 0–29)
Microalbumin, Urine: 23.4 ug/mL

## 2019-07-13 ENCOUNTER — Encounter: Payer: Self-pay | Admitting: Family Medicine

## 2019-07-22 ENCOUNTER — Encounter: Payer: Self-pay | Admitting: Family Medicine

## 2019-07-22 DIAGNOSIS — E119 Type 2 diabetes mellitus without complications: Secondary | ICD-10-CM

## 2019-07-23 NOTE — Telephone Encounter (Signed)
Pt is requesting his A1C result but I do not see one in there does pt need to return for repeat?

## 2019-08-02 ENCOUNTER — Other Ambulatory Visit: Payer: Self-pay

## 2019-08-02 ENCOUNTER — Ambulatory Visit: Payer: 59 | Admitting: Family Medicine

## 2019-08-03 ENCOUNTER — Encounter: Payer: Self-pay | Admitting: Registered Nurse

## 2019-08-03 ENCOUNTER — Ambulatory Visit (INDEPENDENT_AMBULATORY_CARE_PROVIDER_SITE_OTHER): Payer: 59 | Admitting: Registered Nurse

## 2019-08-03 ENCOUNTER — Other Ambulatory Visit: Payer: Self-pay

## 2019-08-03 VITALS — BP 121/77 | HR 72 | Temp 97.5°F | Resp 16 | Ht 76.0 in | Wt 189.6 lb

## 2019-08-03 DIAGNOSIS — K644 Residual hemorrhoidal skin tags: Secondary | ICD-10-CM

## 2019-08-03 MED ORDER — LIDOCAINE-HYDROCORTISONE ACE 2-2 % RE KIT: 1.0000 "application " | PACK | Freq: Two times a day (BID) | RECTAL | 4 refills | Status: DC

## 2019-08-03 MED ORDER — POLYETHYLENE GLYCOL 3350 17 GM/SCOOP PO POWD: 17.0000 g | Freq: Two times a day (BID) | ORAL | 1 refills | Status: DC | PRN

## 2019-08-03 NOTE — Patient Instructions (Addendum)
Mr. Kevin Evans -   Kevin Evans to see you today.  Please use the cream that I am sending over twice daily as needed for rectal pain. Please start on a fiber supplement once daily - I recommend psyllium husk. This comes in capsules or powder. Please drink plenty of water when you take this. If your stool is harder than normal, you can use the MiraLax intermittently for relief.  I am sending thoughts to your family as your brother is dealing with his illness. I hope you have safe travels.  Please let me know if there's anything Dr. Carlota Raspberry or I can do for you   Kevin Ruddy, NP    If you have lab work done today you will be contacted with your lab results within the next 2 weeks.  If you have not heard from [place] then please contact us. The fastest way to get your results is to register for My Chart.   IF you received an x-ray today, you will receive an invoice from [facility] Radiology. Please contact [facility] Radiology at [phone] with questions or concerns regarding your invoice.   IF you received labwork today, you will receive an invoice from [place]. Please contact [facility] at [phone] with questions or concerns regarding your invoice.   Our billing staff will not be able to assist you with questions regarding bills from these companies.  You will be contacted with the lab results as soon as they are available. The fastest way to get your results is to activate your My Chart account. Instructions are located on the last page of this paperwork. If you have not heard from [place] regarding the results in 2 weeks, please contact this office.

## 2019-08-07 NOTE — Telephone Encounter (Signed)
See prior message - it appears A1c was ordered, but [facility] did not run test. Can place lab only order, but let him know we are sorry for the inconvenience.

## 2019-12-14 ENCOUNTER — Encounter: Payer: Self-pay | Admitting: Registered Nurse

## 2019-12-14 NOTE — Progress Notes (Signed)
Acute Office Visit  Subjective:    Patient ID: Kevin Evans, male    DOB: 1970/10/04, 49 y.o.   MRN: [id number]  Chief Complaint  Patient presents with  . Hemorrhoids    patient states he thinks he have hemorrhoids for 2 months because its so painful trying to make a bowel movement. Patient denies any blood in stool and has used some cream     HPI Patient is in today for hemorrhoids.  Concerned for hemorrhoids d/t pain with BM. Has been straining much. No recent hx of constipation. Denies melena and brbpr. No change in diet or exercise. Has had hx of hemorrhoids  No other concerns today  Past Medical History:  Diagnosis Date  . Diabetes mellitus without complication ([place])   . GERD (gastroesophageal reflux disease)   . HLD (hyperlipidemia)   . HTN (hypertension)     No past surgical history on file.  Family History  Problem Relation Age of Onset  . Hypertension Mother   . Stroke Mother   . Diabetes Paternal Aunt   . Diabetes Paternal Uncle   . Breast cancer Paternal Aunt     Social History   Socioeconomic History  . Marital status: Single    Spouse name: Not on file  . Number of children: 0  . Years of education: Not on file  . Highest education level: Not on file  Occupational History  . Occupation: truck Geophysicist/field seismologist  Tobacco Use  . Smoking status: Never Smoker  . Smokeless tobacco: Never Used  Substance and Sexual Activity  . Alcohol use: No    Alcohol/week: 0.0 standard drinks  . Drug use: No  . Sexual activity: Yes    Birth control/protection: Condom  Other Topics Concern  . Not on file  Social History Narrative  . Not on file   Social Determinants of Health   Financial Resource Strain:   . Difficulty of Paying Living Expenses: Not on file  Food Insecurity:   . Worried About Charity fundraiser in the Last Year: Not on file  . Ran Out of Food in the Last Year: Not on file  Transportation Needs:   . Lack of Transportation (Medical): Not on file  .  Lack of Transportation (Non-Medical): Not on file  Physical Activity:   . Days of Exercise per Week: Not on file  . Minutes of Exercise per Session: Not on file  Stress:   . Feeling of Stress : Not on file  Social Connections:   . Frequency of Communication with Friends and Family: Not on file  . Frequency of Social Gatherings with Friends and Family: Not on file  . Attends Religious Services: Not on file  . Active Member of Clubs or Organizations: Not on file  . Attends Archivist Meetings: Not on file  . Marital Status: Not on file  Intimate Partner Violence:   . Fear of Current or Ex-Partner: Not on file  . Emotionally Abused: Not on file  . Physically Abused: Not on file  . Sexually Abused: Not on file    Outpatient Medications Prior to Visit  Medication Sig Dispense Refill  . atorvastatin (LIPITOR) 10 MG tablet Take 1 tablet (10 mg total) by mouth daily. 90 tablet 2  . blood glucose meter kit and supplies Dispense based on patient and insurance preference.check up to once per day. 1 each 0  . Blood Glucose Monitoring Suppl w/Device KIT Monitor glucose twice daily 100 each 11  .  empagliflozin (JARDIANCE) 10 MG TABS tablet Take 10 mg by mouth daily. 90 tablet 2  . metFORMIN (GLUCOPHAGE) 1000 MG tablet TAKE 1 TABLET(1000 MG) BY MOUTH TWICE DAILY WITH A MEAL 180 tablet 2  . triamcinolone cream (KENALOG) 0.1 % Apply 1 application topically 2 (two) times daily. 30 g 1   No facility-administered medications prior to visit.    Allergies  Allergen Reactions  . Bactrim [Sulfamethoxazole-Trimethoprim] Other (See Comments)    Itching all over body     Review of Systems Per hpi      Objective:    Physical Exam Vitals and nursing note reviewed.  Constitutional:      Appearance: Normal appearance. He is normal weight.  Cardiovascular:     Rate and Rhythm: Normal rate and regular rhythm.     Heart sounds: Normal heart sounds.  Pulmonary:     Effort: Pulmonary effort  is normal. No respiratory distress.     Breath sounds: Normal breath sounds.  Abdominal:     General: Abdomen is flat. Bowel sounds are normal. There is no distension.     Palpations: There is no mass.     Tenderness: There is no abdominal tenderness. There is no guarding.  Genitourinary:    Rectum: Tenderness, external hemorrhoid and internal hemorrhoid present.    Musculoskeletal:        General: Normal range of motion.  Skin:    General: Skin is warm and dry.     Coloration: Skin is not jaundiced or pale.     Findings: No erythema.  Neurological:     General: No focal deficit present.     Mental Status: He is alert. Mental status is at baseline.  Psychiatric:        Mood and Affect: Mood normal.        Behavior: Behavior normal.        Thought Content: Thought content normal.     BP 121/77   Pulse 72   Temp (!) 97.5 F (36.4 C) (Temporal)   Resp 16   Ht _0  (1.93 m)   Wt 189 lb 9.6 oz (86 kg)   SpO2 97%   BMI 23.08 kg/m  Wt Readings from Last 3 Encounters:  08/03/19 189 lb 9.6 oz (86 kg)  07/09/19 198 lb 6.4 oz (90 kg)  04/17/18 198 lb (89.8 kg)    Health Maintenance Due  Topic Date Due  . Hepatitis C Screening  Never done  . OPHTHALMOLOGY EXAM  03/08/2018  . HEMOGLOBIN A1C  10/16/2018  . INFLUENZA VACCINE  10/28/2019    There are no preventive care reminders to display for this patient.   Lab Results  Component Value Date   TSH 1.321 12/10/2014   Lab Results  Component Value Date   WBC 3.7 03/09/2017   HGB 14.0 03/09/2017   HCT 41.0 03/09/2017   MCV 80 03/09/2017   PLT 252 03/09/2017   Lab Results  Component Value Date   NA 140 01/03/2018   K 4.2 01/03/2018   CO2 25 01/03/2018   GLUCOSE 120 (H) 01/03/2018   BUN 14 01/03/2018   CREATININE 1.05 01/03/2018   BILITOT <0.2 01/03/2018   ALKPHOS 64 01/03/2018   AST 15 01/03/2018   ALT 20 01/03/2018   PROT 7.7 01/03/2018   ALBUMIN 4.7 01/03/2018   CALCIUM 10.0 01/03/2018   ANIONGAP 6  05/31/2016   Lab Results  Component Value Date   CHOL 149 01/03/2018   Lab Results  Component Value Date   HDL 34 (L) 01/03/2018   Lab Results  Component Value Date   LDLCALC 80 01/03/2018   Lab Results  Component Value Date   TRIG 173 (H) 01/03/2018   Lab Results  Component Value Date   CHOLHDL 4.4 01/03/2018   Lab Results  Component Value Date   HGBA1C 6.7 (A) 04/17/2018       Assessment & Plan:   Problem List Items Addressed This Visit    None    Visit Diagnoses    External hemorrhoids    -  Primary   Relevant Medications   Lidocaine-Hydrocortisone Ace 2-2 % KIT   polyethylene glycol powder (GLYCOLAX/MIRALAX) 17 GM/SCOOP powder       Meds ordered this encounter  Medications  . Lidocaine-Hydrocortisone Ace 2-2 % KIT    Sig: Place 1 application rectally in the morning and at bedtime.    Dispense:  4 kit    Refill:  4    Order Specific Question:   Supervising Provider    Answer:   Delia Chimes A [id number]  . polyethylene glycol powder (GLYCOLAX/MIRALAX) 17 GM/SCOOP powder    Sig: Take 17 g by mouth 2 (two) times daily as needed.    Dispense:  578 g    Refill:  1    Order Specific Question:   Supervising Provider    Answer:   Forrest Moron [id number]   PLAN  Lidocaine-hydrocortisone topical twice daily  miralax to help move bowels  Return PRN  Patient encouraged to call clinic with any questions, comments, or concerns.  Maximiano Coss, NP

## 2019-12-31 ENCOUNTER — Encounter: Payer: Self-pay | Admitting: Emergency Medicine

## 2019-12-31 ENCOUNTER — Ambulatory Visit (INDEPENDENT_AMBULATORY_CARE_PROVIDER_SITE_OTHER): Payer: 59 | Admitting: Emergency Medicine

## 2019-12-31 ENCOUNTER — Other Ambulatory Visit: Payer: Self-pay

## 2019-12-31 VITALS — BP 146/86 | HR 75 | Temp 97.3°F | Resp 16 | Ht 76.0 in | Wt 196.0 lb

## 2019-12-31 DIAGNOSIS — E785 Hyperlipidemia, unspecified: Secondary | ICD-10-CM

## 2019-12-31 DIAGNOSIS — Z23 Encounter for immunization: Secondary | ICD-10-CM

## 2019-12-31 DIAGNOSIS — E1165 Type 2 diabetes mellitus with hyperglycemia: Secondary | ICD-10-CM

## 2019-12-31 DIAGNOSIS — E1169 Type 2 diabetes mellitus with other specified complication: Secondary | ICD-10-CM | POA: Diagnosis not present

## 2019-12-31 LAB — COMPREHENSIVE METABOLIC PANEL
ALT: 16 IU/L (ref 0–44)
AST: 15 IU/L (ref 0–40)
Albumin/Globulin Ratio: 1.3 (ref 1.2–2.2)
Albumin: 4.2 g/dL (ref 4.0–5.0)
Alkaline Phosphatase: 79 IU/L (ref 44–121)
BUN/Creatinine Ratio: 12 (ref 9–20)
BUN: 12 mg/dL (ref 6–24)
Bilirubin Total: 0.2 mg/dL (ref 0.0–1.2)
CO2: 24 mmol/L (ref 20–29)
Calcium: 9.6 mg/dL (ref 8.7–10.2)
Chloride: 101 mmol/L (ref 96–106)
Creatinine, Ser: 1 mg/dL (ref 0.76–1.27)
GFR calc Af Amer: 103 mL/min/{1.73_m2} (ref >59)
GFR calc non Af Amer: 89 mL/min/{1.73_m2} (ref >59)
Globulin, Total: 3.3 g/dL (ref 1.5–4.5)
Glucose: 150 mg/dL — ABNORMAL HIGH (ref 65–99)
Potassium: 4.1 mmol/L (ref 3.5–5.2)
Sodium: 139 mmol/L (ref 134–144)
Total Protein: 7.5 g/dL (ref 6.0–8.5)

## 2019-12-31 LAB — POCT GLYCOSYLATED HEMOGLOBIN (HGB A1C): Hemoglobin A1C: 8.6 % — AB (ref 4.0–5.6)

## 2019-12-31 LAB — LIPID PANEL
Chol/HDL Ratio: 4.4 ratio (ref 0.0–5.0)
Cholesterol, Total: 166 mg/dL (ref 100–199)
HDL: 38 mg/dL — ABNORMAL LOW (ref >39)
LDL Chol Calc (NIH): 113 mg/dL — ABNORMAL HIGH (ref 0–99)
Triglycerides: 81 mg/dL (ref 0–149)
VLDL Cholesterol Cal: 15 mg/dL (ref 5–40)

## 2019-12-31 LAB — GLUCOSE, POCT (MANUAL RESULT ENTRY): POC Glucose: 154 mg/dl — AB (ref 70–99)

## 2019-12-31 MED ORDER — BLOOD GLUCOSE METER KIT: PACK | 0 refills | Status: DC

## 2019-12-31 NOTE — Assessment & Plan Note (Signed)
Uncontrolled diabetes with hemoglobin A1c at 8.6.  Continue Metformin and start Jardiance.  Diet and nutrition discussed. Follow-up with Dr. Carlota Raspberry in 3 months.

## 2019-12-31 NOTE — Patient Instructions (Addendum)
   If you have lab work done today you will be contacted with your lab results within the next 2 weeks.  If you have not heard from us then please contact us. The fastest way to get your results is to register for My Chart.   IF you received an x-ray today, you will receive an invoice from [facility]. Please contact  Radiology at [phone] with questions or concerns regarding your invoice.   IF you received labwork today, you will receive an invoice from [facility]. Please contact [facility] at [phone] with questions or concerns regarding your invoice.   Our billing staff will not be able to assist you with questions regarding bills from these companies.  You will be contacted with the lab results as soon as they are available. The fastest way to get your results is to activate your My Chart account. Instructions are located on the last page of this paperwork. If you have not heard from us regarding the results in 2 weeks, please contact this office.     Diabetes Mellitus and Nutrition, Adult When you have diabetes (diabetes mellitus), it is very important to have healthy eating habits because your blood sugar (glucose) levels are greatly affected by what you eat and drink. Eating healthy foods in the appropriate amounts, at about the same times every day, can help you:  Control your blood glucose.  Lower your risk of heart disease.  Improve your blood pressure.  Reach or maintain a healthy weight. Every person with diabetes is different, and each person has different needs for a meal plan. Your health care provider may recommend that you work with a diet and nutrition specialist (dietitian) to make a meal plan that is best for you. Your meal plan may vary depending on factors such as:  The calories you need.  The medicines you take.  Your weight.  Your blood glucose, blood pressure, and cholesterol levels.  Your activity level.  Other health conditions  you have, such as heart or kidney disease. How do carbohydrates affect me? Carbohydrates, also called carbs, affect your blood glucose level more than any other type of food. Eating carbs naturally raises the amount of glucose in your blood. Carb counting is a method for keeping track of how many carbs you eat. Counting carbs is important to keep your blood glucose at a healthy level, especially if you use insulin or take certain oral diabetes medicines. It is important to know how many carbs you can safely have in each meal. This is different for every person. Your dietitian can help you calculate how many carbs you should have at each meal and for each snack. Foods that contain carbs include:  Bread, cereal, rice, pasta, and crackers.  Potatoes and corn.  Peas, beans, and lentils.  Milk and yogurt.  Fruit and juice.  Desserts, such as cakes, cookies, ice cream, and candy. How does alcohol affect me? Alcohol can cause a sudden decrease in blood glucose (hypoglycemia), especially if you use insulin or take certain oral diabetes medicines. Hypoglycemia can be a life-threatening condition. Symptoms of hypoglycemia (sleepiness, dizziness, and confusion) are similar to symptoms of having too much alcohol. If your health care provider says that alcohol is safe for you, follow these guidelines:  Limit alcohol intake to no more than 1 drink per day for nonpregnant women and 2 drinks per day for men. One drink equals 12 oz of beer, 5 oz of wine, or 1 oz of hard liquor.    Do not drink on an empty stomach.  Keep yourself hydrated with water, diet soda, or unsweetened iced tea.  Keep in mind that regular soda, juice, and other mixers may contain a lot of sugar and must be counted as carbs. What are tips for following this plan?  Reading food labels  Start by checking the serving size on the "Nutrition Facts" label of packaged foods and drinks. The amount of calories, carbs, fats, and other  nutrients listed on the label is based on one serving of the item. Many items contain more than one serving per package.  Check the total grams (g) of carbs in one serving. You can calculate the number of servings of carbs in one serving by dividing the total carbs by 15. For example, if a food has 30 g of total carbs, it would be equal to 2 servings of carbs.  Check the number of grams (g) of saturated and trans fats in one serving. Choose foods that have low or no amount of these fats.  Check the number of milligrams (mg) of salt (sodium) in one serving. Most people should limit total sodium intake to less than 2,300 mg per day.  Always check the nutrition information of foods labeled as "low-fat" or "nonfat". These foods may be higher in added sugar or refined carbs and should be avoided.  Talk to your dietitian to identify your daily goals for nutrients listed on the label. Shopping  Avoid buying canned, premade, or processed foods. These foods tend to be high in fat, sodium, and added sugar.  Shop around the outside edge of the grocery store. This includes fresh fruits and vegetables, bulk grains, fresh meats, and fresh dairy. Cooking  Use low-heat cooking methods, such as baking, instead of high-heat cooking methods like deep frying.  Cook using healthy oils, such as olive, canola, or sunflower oil.  Avoid cooking with butter, cream, or high-fat meats. Meal planning  Eat meals and snacks regularly, preferably at the same times every day. Avoid going long periods of time without eating.  Eat foods high in fiber, such as fresh fruits, vegetables, beans, and whole grains. Talk to your dietitian about how many servings of carbs you can eat at each meal.  Eat 4-6 ounces (oz) of lean protein each day, such as lean meat, chicken, fish, eggs, or tofu. One oz of lean protein is equal to: ? 1 oz of meat, chicken, or fish. ? 1 egg. ?  cup of tofu.  Eat some foods each day that contain  healthy fats, such as avocado, nuts, seeds, and fish. Lifestyle  Check your blood glucose regularly.  Exercise regularly as told by your health care provider. This may include: ? 150 minutes of moderate-intensity or vigorous-intensity exercise each week. This could be brisk walking, biking, or water aerobics. ? Stretching and doing strength exercises, such as yoga or weightlifting, at least 2 times a week.  Take medicines as told by your health care provider.  Do not use any products that contain nicotine or tobacco, such as cigarettes and e-cigarettes. If you need help quitting, ask your health care provider.  Work with a counselor or diabetes educator to identify strategies to manage stress and any emotional and social challenges. Questions to ask a health care provider  Do I need to meet with a diabetes educator?  Do I need to meet with a dietitian?  What number can I call if I have questions?  When are the best times to   times to check my blood glucose? Where to find more information:  American Diabetes Association: diabetes.org  Academy of Nutrition and Dietetics: www.eatright.org  National Institute of Diabetes and Digestive and Kidney Diseases (NIH): www.niddk.nih.gov Summary  A healthy meal plan will help you control your blood glucose and maintain a healthy lifestyle.  Working with a diet and nutrition specialist (dietitian) can help you make a meal plan that is best for you.  Keep in mind that carbohydrates (carbs) and alcohol have immediate effects on your blood glucose levels. It is important to count carbs and to use alcohol carefully. This information is not intended to replace advice given to you by your health care provider. Make sure you discuss any questions you have with your health care provider. Document Revised: 02/25/2017 Document Reviewed: 04/19/2016 Elsevier Patient Education  2020 [employer]  

## 2019-12-31 NOTE — Progress Notes (Signed)
Kevin Evans 49 y.o.   Chief Complaint  Patient presents with  . Diabetes    follow up 6 month  . Rash    left elbow for 1 week per patient it is not a rash and he will explain    HISTORY OF PRESENT ILLNESS: This is a 49 y.o. male with history of diabetes, patient of Dr. Carlota Raspberry, here for follow-up. Presently only taking Metformin 1000 mg twice a day.  Stopped Jardiance 1-1/2 months ago due to visual problems he felt were secondary to [place].  Visual problems better now. Also has history of chronic skin pustules that come and go.  None active today. No other complaints or medical concerns today. Lab Results  Component Value Date   HGBA1C 6.7 (A) 04/17/2018    HPI   Prior to Admission medications   Medication Sig Start Date End Date Taking? Authorizing Provider  atorvastatin (LIPITOR) 10 MG tablet Take 1 tablet (10 mg total) by mouth daily. 07/09/19  Yes Wendie Agreste, MD  blood glucose meter kit and supplies Dispense based on patient and insurance preference.check up to once per day. 04/17/18  Yes Wendie Agreste, MD  metFORMIN (GLUCOPHAGE) 1000 MG tablet TAKE 1 TABLET(1000 MG) BY MOUTH TWICE DAILY WITH A MEAL 07/09/19  Yes Wendie Agreste, MD  triamcinolone cream (KENALOG) 0.1 % Apply 1 application topically 2 (two) times daily. 07/09/19  Yes Wendie Agreste, MD  Blood Glucose Monitoring Suppl w/Device KIT Monitor glucose twice daily 07/04/15   Ezekiel Slocumb, PA-C  empagliflozin (JARDIANCE) 10 MG TABS tablet Take 10 mg by mouth daily. Patient not taking: Reported on 12/31/2019 07/09/19   Wendie Agreste, MD  Lidocaine-Hydrocortisone Ace 2-2 % KIT Place 1 application rectally in the morning and at bedtime. 08/03/19   Maximiano Coss, NP  polyethylene glycol powder (GLYCOLAX/MIRALAX) 17 GM/SCOOP powder Take 17 g by mouth 2 (two) times daily as needed. Patient not taking: Reported on 12/31/2019 08/03/19   Maximiano Coss, NP    Allergies  Allergen Reactions  . Bactrim  [Sulfamethoxazole-Trimethoprim] Other (See Comments)    Itching all over body     Patient Active Problem List   Diagnosis Date Noted  . Hyperlipidemia 03/09/2017  . Non-intractable vomiting with nausea 06/02/2016  . Viral illness 06/02/2016  . Malaise and fatigue 06/02/2016  . Type 2 diabetes mellitus without complication, without long-term current use of insulin ([place]) 04/08/2015    Past Medical History:  Diagnosis Date  . Diabetes mellitus without complication ([place])   . GERD (gastroesophageal reflux disease)   . HLD (hyperlipidemia)   . HTN (hypertension)     No past surgical history on file.  Social History   Socioeconomic History  . Marital status: Single    Spouse name: Not on file  . Number of children: 0  . Years of education: Not on file  . Highest education level: Not on file  Occupational History  . Occupation: truck Geophysicist/field seismologist  Tobacco Use  . Smoking status: Never Smoker  . Smokeless tobacco: Never Used  Substance and Sexual Activity  . Alcohol use: No    Alcohol/week: 0.0 standard drinks  . Drug use: No  . Sexual activity: Yes    Birth control/protection: Condom  Other Topics Concern  . Not on file  Social History Narrative  . Not on file   Social Determinants of Health   Financial Resource Strain:   . Difficulty of Paying Living Expenses: Not on file  Food Insecurity:   .  Worried About Charity fundraiser in the Last Year: Not on file  . Ran Out of Food in the Last Year: Not on file  Transportation Needs:   . Lack of Transportation (Medical): Not on file  . Lack of Transportation (Non-Medical): Not on file  Physical Activity:   . Days of Exercise per Week: Not on file  . Minutes of Exercise per Session: Not on file  Stress:   . Feeling of Stress : Not on file  Social Connections:   . Frequency of Communication with Friends and Family: Not on file  . Frequency of Social Gatherings with Friends and Family: Not on file  . Attends Religious  Services: Not on file  . Active Member of Clubs or Organizations: Not on file  . Attends Archivist Meetings: Not on file  . Marital Status: Not on file  Intimate Partner Violence:   . Fear of Current or Ex-Partner: Not on file  . Emotionally Abused: Not on file  . Physically Abused: Not on file  . Sexually Abused: Not on file    Family History  Problem Relation Age of Onset  . Hypertension Mother   . Stroke Mother   . Diabetes Paternal Aunt   . Diabetes Paternal Uncle   . Breast cancer Paternal Aunt      Review of Systems  Constitutional: Negative.  Negative for chills and fever.  HENT: Negative.  Negative for congestion and sore throat.   Respiratory: Negative.  Negative for cough and shortness of breath.   Cardiovascular: Negative.  Negative for chest pain and palpitations.  Gastrointestinal: Positive for heartburn. Negative for abdominal pain, diarrhea, nausea and vomiting.  Genitourinary: Negative.  Negative for dysuria and hematuria.  Musculoskeletal: Negative.  Negative for back pain, myalgias and neck pain.  Skin: Negative for rash.       Chronic pustules  Neurological: Negative.  Negative for dizziness and headaches.  All other systems reviewed and are negative.   Today's Vitals   12/31/19 0828  BP: (!) 146/86  Pulse: 75  Resp: 16  Temp: (!) 97.3 F (36.3 C)  TempSrc: Temporal  SpO2: 98%  Weight: 196 lb (88.9 kg)  Height: $Remove'6\' 4"'aszuFEl$  (1.93 m)   Body mass index is 23.86 kg/m. Wt Readings from Last 3 Encounters:  12/31/19 196 lb (88.9 kg)  08/03/19 189 lb 9.6 oz (86 kg)  07/09/19 198 lb 6.4 oz (90 kg)    Physical Exam Vitals reviewed.  Constitutional:      Appearance: Normal appearance.  HENT:     Head: Normocephalic.  Eyes:     Extraocular Movements: Extraocular movements intact.     Pupils: Pupils are equal, round, and reactive to light.  Cardiovascular:     Rate and Rhythm: Normal rate and regular rhythm.     Pulses: Normal pulses.      Heart sounds: Normal heart sounds.  Pulmonary:     Effort: Pulmonary effort is normal.     Breath sounds: Normal breath sounds.  Musculoskeletal:        General: Normal range of motion.     Cervical back: Normal range of motion and neck supple.  Skin:    General: Skin is warm and dry.     Capillary Refill: Capillary refill takes less than 2 seconds.     Comments: Several small noninfected sebaceous cysts noticed to back and left elbow  Neurological:     General: No focal deficit present.  Mental Status: He is alert and oriented to person, place, and time.  Psychiatric:        Mood and Affect: Mood normal.        Behavior: Behavior normal.    Results for orders placed or performed in visit on 12/31/19 (from the past 24 hour(s))  POCT glycosylated hemoglobin (Hb A1C)     Status: Abnormal   Collection Time: 12/31/19  9:12 AM  Result Value Ref Range   Hemoglobin A1C 8.6 (A) 4.0 - 5.6 %   HbA1c POC (<> result, manual entry)     HbA1c, POC (prediabetic range)     HbA1c, POC (controlled diabetic range)    POCT glucose (manual entry)     Status: Abnormal   Collection Time: 12/31/19  9:14 AM  Result Value Ref Range   POC Glucose 154 (A) 70 - 99 mg/dl     ASSESSMENT & PLAN: Diabetes ([place]) Uncontrolled diabetes with hemoglobin A1c at 8.6.  Continue Metformin and start Jardiance.  Diet and nutrition discussed. Follow-up with Dr. Carlota Raspberry in 3 months.  Kevin Evans was seen today for diabetes and rash.  Diagnoses and all orders for this visit:  Type 2 diabetes mellitus with hyperglycemia, without long-term current use of insulin (HCC) -     Cancel: Hemoglobin A1c -     Ambulatory referral to Ophthalmology -     blood glucose meter kit and supplies; Dispense based on patient and insurance preference.check up to once per day. -     POCT glycosylated hemoglobin (Hb A1C) -     POCT glucose (manual entry) -     Comprehensive metabolic panel -     Lipid panel  Dyslipidemia associated with  type 2 diabetes mellitus (HCC) -     Lipid panel  Need for prophylactic vaccination and inoculation against influenza -     Flu Vaccine QUAD 36+ mos IM    Patient Instructions       If you have lab work done today you will be contacted with your lab results within the next 2 weeks.  If you have not heard from [place] then please contact us. The fastest way to get your results is to register for My Chart.   IF you received an x-ray today, you will receive an invoice from [facility]. Please contact [facility] at [phone] with questions or concerns regarding your invoice.   IF you received labwork today, you will receive an invoice from [facility]. Please contact [facility] at [phone] with questions or concerns regarding your invoice.   Our billing staff will not be able to assist you with questions regarding bills from these companies.  You will be contacted with the lab results as soon as they are available. The fastest way to get your results is to activate your My Chart account. Instructions are located on the last page of this paperwork. If you have not heard from [place] regarding the results in 2 weeks, please contact this office.     Diabetes Mellitus and Nutrition, Adult When you have diabetes (diabetes mellitus), it is very important to have healthy eating habits because your blood sugar (glucose) levels are greatly affected by what you eat and drink. Eating healthy foods in the appropriate amounts, at about the same times every day, can help you:  Control your blood glucose.  Lower your risk of heart disease.  Improve your blood pressure.  Reach or maintain a healthy weight. Every person with diabetes is different, and each  person has different needs for a meal plan. Your health care provider may recommend that you work with a diet and nutrition specialist (dietitian) to make a meal plan that is best for you. Your meal plan may vary depending on factors  such as:  The calories you need.  The medicines you take.  Your weight.  Your blood glucose, blood pressure, and cholesterol levels.  Your activity level.  Other health conditions you have, such as heart or kidney disease. How do carbohydrates affect me? Carbohydrates, also called carbs, affect your blood glucose level more than any other type of food. Eating carbs naturally raises the amount of glucose in your blood. Carb counting is a method for keeping track of how many carbs you eat. Counting carbs is important to keep your blood glucose at a healthy level, especially if you use insulin or take certain oral diabetes medicines. It is important to know how many carbs you can safely have in each meal. This is different for every person. Your dietitian can help you calculate how many carbs you should have at each meal and for each snack. Foods that contain carbs include:  Bread, cereal, rice, pasta, and crackers.  Potatoes and corn.  Peas, beans, and lentils.  Milk and yogurt.  Fruit and juice.  Desserts, such as cakes, cookies, ice cream, and candy. How does alcohol affect me? Alcohol can cause a sudden decrease in blood glucose (hypoglycemia), especially if you use insulin or take certain oral diabetes medicines. Hypoglycemia can be a life-threatening condition. Symptoms of hypoglycemia (sleepiness, dizziness, and confusion) are similar to symptoms of having too much alcohol. If your health care provider says that alcohol is safe for you, follow these guidelines:  Limit alcohol intake to no more than 1 drink per day for nonpregnant women and 2 drinks per day for men. One drink equals 12 oz of beer, 5 oz of wine, or 1 oz of hard liquor.  Do not drink on an empty stomach.  Keep yourself hydrated with water, diet soda, or unsweetened iced tea.  Keep in mind that regular soda, juice, and other mixers may contain a lot of sugar and must be counted as carbs. What are tips for  following this plan?  Reading food labels  Start by checking the serving size on the "Nutrition Facts" label of packaged foods and drinks. The amount of calories, carbs, fats, and other nutrients listed on the label is based on one serving of the item. Many items contain more than one serving per package.  Check the total grams (g) of carbs in one serving. You can calculate the number of servings of carbs in one serving by dividing the total carbs by 15. For example, if a food has 30 g of total carbs, it would be equal to 2 servings of carbs.  Check the number of grams (g) of saturated and trans fats in one serving. Choose foods that have low or no amount of these fats.  Check the number of milligrams (mg) of salt (sodium) in one serving. Most people should limit total sodium intake to less than 2,300 mg per day.  Always check the nutrition information of foods labeled as "low-fat" or "nonfat". These foods may be higher in added sugar or refined carbs and should be avoided.  Talk to your dietitian to identify your daily goals for nutrients listed on the label. Shopping  Avoid buying canned, premade, or processed foods. These foods tend to be high in fat,  sodium, and added sugar.  Shop around the outside edge of the grocery store. This includes fresh fruits and vegetables, bulk grains, fresh meats, and fresh dairy. Cooking  Use low-heat cooking methods, such as baking, instead of high-heat cooking methods like deep frying.  Cook using healthy oils, such as olive, canola, or sunflower oil.  Avoid cooking with butter, cream, or high-fat meats. Meal planning  Eat meals and snacks regularly, preferably at the same times every day. Avoid going long periods of time without eating.  Eat foods high in fiber, such as fresh fruits, vegetables, beans, and whole grains. Talk to your dietitian about how many servings of carbs you can eat at each meal.  Eat 4-6 ounces (oz) of lean protein each day,  such as lean meat, chicken, fish, eggs, or tofu. One oz of lean protein is equal to: ? 1 oz of meat, chicken, or fish. ? 1 egg. ?  cup of tofu.  Eat some foods each day that contain healthy fats, such as avocado, nuts, seeds, and fish. Lifestyle  Check your blood glucose regularly.  Exercise regularly as told by your health care provider. This may include: ? 150 minutes of moderate-intensity or vigorous-intensity exercise each week. This could be brisk walking, biking, or water aerobics. ? Stretching and doing strength exercises, such as yoga or weightlifting, at least 2 times a week.  Take medicines as told by your health care provider.  Do not use any products that contain nicotine or tobacco, such as cigarettes and e-cigarettes. If you need help quitting, ask your health care provider.  Work with a Social worker or diabetes educator to identify strategies to manage stress and any emotional and social challenges. Questions to ask a health care provider  Do I need to meet with a diabetes educator?  Do I need to meet with a dietitian?  What number can I call if I have questions?  When are the best times to check my blood glucose? Where to find more information:  American Diabetes Association: diabetes.org  Academy of Nutrition and Dietetics: www.eatright.[employer] of Diabetes and Digestive and Kidney Diseases (NIH): [employer] Summary  A healthy meal plan will help you control your blood glucose and maintain a healthy lifestyle.  Working with a diet and nutrition specialist (dietitian) can help you make a meal plan that is best for you.  Keep in mind that carbohydrates (carbs) and alcohol have immediate effects on your blood glucose levels. It is important to count carbs and to use alcohol carefully. This information is not intended to replace advice given to you by your health care provider. Make sure you discuss any questions you have with your health  care provider. Document Revised: 02/25/2017 Document Reviewed: 04/19/2016 Elsevier Patient Education  2020 Elsevier Inc.      Agustina Caroli, MD [facility]

## 2020-01-01 MED ORDER — BLOOD GLUCOSE METER KIT: PACK | 0 refills | Status: DC

## 2020-01-01 NOTE — Addendum Note (Signed)
Addended by: Alfredia Ferguson A on: 01/01/2020 01:30 PM   Modules accepted: Orders

## 2020-01-02 ENCOUNTER — Telehealth: Payer: Self-pay | Admitting: *Deleted

## 2020-01-02 NOTE — Telephone Encounter (Signed)
On 12/02/2019, faxed Rx for blood glucose meter kit and supplies. Confirmation page 1:42 pm.

## 2020-01-04 ENCOUNTER — Ambulatory Visit: Payer: 59 | Admitting: Family Medicine

## 2020-03-31 ENCOUNTER — Encounter: Payer: Self-pay | Admitting: Family Medicine

## 2020-03-31 ENCOUNTER — Ambulatory Visit (INDEPENDENT_AMBULATORY_CARE_PROVIDER_SITE_OTHER): Payer: 59 | Admitting: Family Medicine

## 2020-03-31 ENCOUNTER — Other Ambulatory Visit: Payer: Self-pay

## 2020-03-31 VITALS — BP 132/75 | HR 85 | Temp 98.9°F | Resp 17 | Ht 76.0 in | Wt 189.4 lb

## 2020-03-31 DIAGNOSIS — E1165 Type 2 diabetes mellitus with hyperglycemia: Secondary | ICD-10-CM | POA: Diagnosis not present

## 2020-03-31 DIAGNOSIS — Z298 Encounter for other specified prophylactic measures: Secondary | ICD-10-CM

## 2020-03-31 DIAGNOSIS — E785 Hyperlipidemia, unspecified: Secondary | ICD-10-CM

## 2020-03-31 DIAGNOSIS — R222 Localized swelling, mass and lump, trunk: Secondary | ICD-10-CM | POA: Diagnosis not present

## 2020-03-31 DIAGNOSIS — E119 Type 2 diabetes mellitus without complications: Secondary | ICD-10-CM | POA: Diagnosis not present

## 2020-03-31 MED ORDER — EMPAGLIFLOZIN 10 MG PO TABS: 10.0000 mg | ORAL_TABLET | Freq: Every day | ORAL | 2 refills | Status: DC

## 2020-03-31 MED ORDER — METFORMIN HCL 1000 MG PO TABS: ORAL_TABLET | ORAL | 2 refills | Status: DC

## 2020-03-31 MED ORDER — DOXYCYCLINE HYCLATE 100 MG PO TABS: 100.0000 mg | ORAL_TABLET | Freq: Every day | ORAL | 0 refills | Status: DC

## 2020-03-31 MED ORDER — ATORVASTATIN CALCIUM 10 MG PO TABS: 10.0000 mg | ORAL_TABLET | Freq: Every day | ORAL | 2 refills | Status: DC

## 2020-03-31 NOTE — Progress Notes (Signed)
Subjective:  Patient ID: Kevin Evans, male    DOB: 1971-01-26  Age: 50 y.o. MRN: [id number]  CC:  Chief Complaint  Patient presents with  . Diabetes    Pt has been checking his BG daily states has not gone above 125 since starting. Pt feels well no concerns     HPI Kevin Evans presents for   Diabetes: With hyperglycemia, A1c elevated 8.6 in October. Was taking Metformin only at that time, Vania Rea has been discontinued due to perceived visual problems, which had improved at his October visit.  Jardiance recommended to restart along with diet nutrition discussed.  Recommended restarting Jardiance in October with diet and nutrition discussed. Microalbumin: Normal ratio in April 2021 Optho, foot exam, pneumovax: Up-to-date  Tolerating metformin and Jardiance - no new side effects,  No return of visual symptoms.  Home readings remain under 125 fasting typically.  No symptomatic lows.   No n/v/abd pain.   Lab Results  Component Value Date   HGBA1C 8.6 (A) 12/31/2019   HGBA1C 6.7 (A) 04/17/2018   HGBA1C 8.9 (A) 01/03/2018   Lab Results  Component Value Date   MICROALBUR 4.3 04/03/2015   LDLCALC 113 (H) 12/31/2019   CREATININE 1.00 12/31/2019   Planning on going to [place] end of January for 2 months. [place].   Immunization History  Administered Date(s) Administered  . Hepatitis A, Adult 03/06/2015  . Hepatitis B 06/03/2005, 07/05/2005, 11/10/2005  . Influenza,inj,Quad PF,6+ Mos 04/13/2014, 12/10/2015, 03/09/2017, 01/03/2018, 12/31/2019  . MMR 09/18/2004  . Meningococcal Conjugate 03/06/2015  . PFIZER SARS-COV-2 Vaccination 06/24/2019, 07/15/2019, 02/15/2020  . Pneumococcal Polysaccharide-23 07/01/2015  . Td 03/06/2015  . Yellow Fever 03/05/2015     History Patient Active Problem List   Diagnosis Date Noted  . Hyperlipidemia 03/09/2017  . Diabetes ([place]) 04/08/2015   Past Medical History:  Diagnosis Date  . Diabetes mellitus without complication ([place])   . GERD  (gastroesophageal reflux disease)   . HLD (hyperlipidemia)   . HTN (hypertension)    No past surgical history on file. Allergies  Allergen Reactions  . Bactrim [Sulfamethoxazole-Trimethoprim] Other (See Comments)    Itching all over body    Prior to Admission medications   Medication Sig Start Date End Date Taking? Authorizing Provider  atorvastatin (LIPITOR) 10 MG tablet Take 1 tablet (10 mg total) by mouth daily. 07/09/19  Yes Wendie Agreste, MD  blood glucose meter kit and supplies Dispense based on patient and insurance preference.check up to once per day. 01/01/20  Yes Horald Pollen, MD  Blood Glucose Monitoring Suppl w/Device KIT Monitor glucose twice daily 07/04/15  Yes Bush, Benjaman Pott, PA-C  empagliflozin (JARDIANCE) 10 MG TABS tablet Take 10 mg by mouth daily. 07/09/19  Yes Wendie Agreste, MD  metFORMIN (GLUCOPHAGE) 1000 MG tablet TAKE 1 TABLET(1000 MG) BY MOUTH TWICE DAILY WITH A MEAL 07/09/19  Yes Wendie Agreste, MD   Social History   Socioeconomic History  . Marital status: Single    Spouse name: Not on file  . Number of children: 0  . Years of education: Not on file  . Highest education level: Not on file  Occupational History  . Occupation: truck Geophysicist/field seismologist  Tobacco Use  . Smoking status: Never Smoker  . Smokeless tobacco: Never Used  Substance and Sexual Activity  . Alcohol use: No    Alcohol/week: 0.0 standard drinks  . Drug use: No  . Sexual activity: Yes    Birth control/protection: Condom  Other Topics  Concern  . Not on file  Social History Narrative  . Not on file   Social Determinants of Health   Financial Resource Strain: Not on file  Food Insecurity: Not on file  Transportation Needs: Not on file  Physical Activity: Not on file  Stress: Not on file  Social Connections: Not on file  Intimate Partner Violence: Not on file    Review of Systems  Constitutional: Negative for fatigue and unexpected weight change.  Eyes: Negative for visual  disturbance.  Respiratory: Negative for cough, chest tightness and shortness of breath.   Cardiovascular: Negative for chest pain, palpitations and leg swelling.  Gastrointestinal: Negative for abdominal pain and blood in stool.  Neurological: Negative for dizziness, light-headedness and headaches.     Objective:   Vitals:   03/31/20 0947  BP: 132/75  Pulse: 85  Resp: 17  Temp: 98.9 F (37.2 C)  TempSrc: Temporal  SpO2: 97%  Weight: 189 lb 6.4 oz (85.9 kg)  Height: $Remove'6\' 4"'fJWrxvb$  (1.93 m)     Physical Exam Vitals reviewed.  Constitutional:      Appearance: He is well-developed and well-nourished.  HENT:     Head: Normocephalic and atraumatic.  Eyes:     Extraocular Movements: EOM normal.     Pupils: Pupils are equal, round, and reactive to light.  Neck:     Vascular: No carotid bruit or JVD.  Cardiovascular:     Rate and Rhythm: Normal rate and regular rhythm.     Heart sounds: Normal heart sounds. No murmur heard.   Pulmonary:     Effort: Pulmonary effort is normal.     Breath sounds: Normal breath sounds. No rales.  Abdominal:     Palpations: There is mass (8cm firm nontender RLQ abd wall mass, firm. ).  Musculoskeletal:        General: No edema.  Skin:    General: Skin is warm and dry.  Neurological:     Mental Status: He is alert and oriented to person, place, and time.  Psychiatric:        Mood and Affect: Mood and affect normal.     Assessment & Plan:  Kevin Evans is a 50 y.o. male . Type 2 diabetes mellitus with hyperglycemia, without long-term current use of insulin (HCC) - Plan: Hemoglobin A1c Controlled type 2 diabetes mellitus without complication, without long-term current use of insulin ([place]) - Plan: metFORMIN (GLUCOPHAGE) 1000 MG tablet  - check A1c. Tolerating jardiance and metformin - continue same .  Need for malaria prophylaxis - Plan: doxycycline (VIBRA-TABS) 100 MG tablet  - potential side effects discussed, use discussed prior to and after  return.   Abdominal wall mass - Plan: US Abdomen Complete  - nontender. Possible lipoma. Check ultrasound initially.    Hyperlipidemia, unspecified hyperlipidemia type - Plan: atorvastatin (LIPITOR) 10 MG tablet  - continue lipitor.  Type 2 diabetes mellitus without complication, without long-term current use of insulin (HCC) - Plan: empagliflozin (JARDIANCE) 10 MG TABS tablet   Meds ordered this encounter  Medications  . doxycycline (VIBRA-TABS) 100 MG tablet    Sig: Take 1 tablet (100 mg total) by mouth daily. Start day before travel, continue 4 weeks after.    Dispense:  90 tablet    Refill:  0  . metFORMIN (GLUCOPHAGE) 1000 MG tablet    Sig: TAKE 1 TABLET(1000 MG) BY MOUTH TWICE DAILY WITH A MEAL    Dispense:  180 tablet    Refill:  2  Can change to #120 with 2 refills if needed for insurance.  Marland Kitchen atorvastatin (LIPITOR) 10 MG tablet    Sig: Take 1 tablet (10 mg total) by mouth daily.    Dispense:  90 tablet    Refill:  2    Can change to #60 with 2 refills if needed for insurance.  . empagliflozin (JARDIANCE) 10 MG TABS tablet    Sig: Take 1 tablet (10 mg total) by mouth daily.    Dispense:  90 tablet    Refill:  2    Can change to #60 with 2 refills if needed for insurance.   Patient Instructions      for travel - start doxycycline day before you leave, continue for 4 weeks once you return for malaria prophylaxis.  Here is a link for info on other vaccines - health department if needed.  [employer]  No change in meds for now. I will check ultrasound of area on abdomen, but if any pain in that area, return and we can discuss further. Thanks for coming in today and take care.   Return to the clinic or go to the nearest emergency room if any of your symptoms worsen or new symptoms occur.    If you have lab work done today you will be contacted with your lab results within the next 2 weeks.  If you have not heard  from [place] then please contact us. The fastest way to get your results is to register for My Chart.   IF you received an x-ray today, you will receive an invoice from [facility]. Please contact [facility] at [phone] with questions or concerns regarding your invoice.   IF you received labwork today, you will receive an invoice from [place]. Please contact [facility] at [phone] with questions or concerns regarding your invoice.   Our billing staff will not be able to assist you with questions regarding bills from these companies.  You will be contacted with the lab results as soon as they are available. The fastest way to get your results is to activate your My Chart account. Instructions are located on the last page of this paperwork. If you have not heard from [place] regarding the results in 2 weeks, please contact this office.         Signed, [name], MD [facility]

## 2020-03-31 NOTE — Patient Instructions (Addendum)
    for travel - start doxycycline day before you leave, continue for 4 weeks once you return for malaria prophylaxis.  Here is a link for info on other vaccines - health department if needed.  [employer]  No change in meds for now. I will check ultrasound of area on abdomen, but if any pain in that area, return and we can discuss further. Thanks for coming in today and take care.   Return to the clinic or go to the nearest emergency room if any of your symptoms worsen or new symptoms occur.    If you have lab work done today you will be contacted with your lab results within the next 2 weeks.  If you have not heard from [place] then please contact us. The fastest way to get your results is to register for My Chart.   IF you received an x-ray today, you will receive an invoice from [facility]. Please contact [facility] at [phone] with questions or concerns regarding your invoice.   IF you received labwork today, you will receive an invoice from [place]. Please contact [facility] at [phone] with questions or concerns regarding your invoice.   Our billing staff will not be able to assist you with questions regarding bills from these companies.  You will be contacted with the lab results as soon as they are available. The fastest way to get your results is to activate your My Chart account. Instructions are located on the last page of this paperwork. If you have not heard from [place] regarding the results in 2 weeks, please contact this office.

## 2020-04-01 LAB — HEMOGLOBIN A1C
Est. average glucose Bld gHb Est-mCnc: 137 mg/dL
Hgb A1c MFr Bld: 6.4 % — ABNORMAL HIGH (ref 4.8–5.6)

## 2020-04-11 ENCOUNTER — Telehealth: Payer: Self-pay | Admitting: Family Medicine

## 2020-04-11 DIAGNOSIS — R222 Localized swelling, mass and lump, trunk: Secondary | ICD-10-CM

## 2020-04-11 NOTE — Telephone Encounter (Signed)
[facility]  Is sending updated order ( In Epic ) for Provider to sign / she is sending back over to get the patient  correct ultra sound 

## 2020-04-14 NOTE — Telephone Encounter (Signed)
[facility]  Is sending updated order ( In Epic ) for Provider to sign / she is sending back over to get the patient  correct ultra sound

## 2020-04-15 NOTE — Telephone Encounter (Signed)
I did not see a pended order so new order placed.  Let me know if I need to do something differently.

## 2020-04-16 ENCOUNTER — Ambulatory Visit
Admission: RE | Admit: 2020-04-16 | Discharge: 2020-04-16 | Disposition: A | Discharge status: Home / Self Care | Payer: BLUE CROSS/BLUE SHIELD | Source: Ambulatory Visit | Attending: Family Medicine | Admitting: Family Medicine

## 2020-04-16 ENCOUNTER — Telehealth: Payer: Self-pay | Admitting: Family Medicine

## 2020-04-16 DIAGNOSIS — R222 Localized swelling, mass and lump, trunk: Secondary | ICD-10-CM

## 2020-04-16 NOTE — Telephone Encounter (Signed)
Opal Sidles for [employer] imaging with stat report Large intermedial mass in R lower quadrant that is corresponding to area of probable concern measuring at least 9.9 cm and CT is recommend for further evaluation. Please advise [phone]

## 2020-04-17 ENCOUNTER — Other Ambulatory Visit: Payer: Self-pay | Admitting: Family Medicine

## 2020-04-17 DIAGNOSIS — R1903 Right lower quadrant abdominal swelling, mass and lump: Secondary | ICD-10-CM

## 2020-04-17 NOTE — Telephone Encounter (Signed)
Informed [place] about stat report.

## 2020-04-21 ENCOUNTER — Ambulatory Visit
Admission: RE | Admit: 2020-04-21 | Discharge: 2020-04-21 | Disposition: A | Discharge status: Home / Self Care | Payer: 59 | Source: Ambulatory Visit | Attending: Family Medicine | Admitting: Family Medicine

## 2020-04-21 ENCOUNTER — Other Ambulatory Visit: Payer: Self-pay

## 2020-04-21 DIAGNOSIS — R1903 Right lower quadrant abdominal swelling, mass and lump: Secondary | ICD-10-CM

## 2020-04-21 MED ORDER — IOPAMIDOL (ISOVUE-300) INJECTION 61%
100.0000 mL | Freq: Once | INTRAVENOUS | Status: AC | PRN
  Administered 2020-04-21: 100 mL via INTRAVENOUS

## 2020-04-21 NOTE — Telephone Encounter (Signed)
See result note. Reviewed with pt.

## 2020-04-21 NOTE — Telephone Encounter (Signed)
You did see this correct?

## 2020-04-23 ENCOUNTER — Encounter: Payer: Self-pay | Admitting: Family Medicine

## 2020-04-24 ENCOUNTER — Telehealth: Payer: Self-pay | Admitting: Family Medicine

## 2020-04-24 NOTE — Telephone Encounter (Addendum)
04/24/2020 - PATIENT STATES DR. Carlota Raspberry ORDERED A CT SCAN OF HIS LOWER ABDOMEN. HE IS WAITING FOR DR. Carlota Raspberry TO GIVE HIM THE RESULTS BECAUSE HE HAS TO GO OUT OF TOWN NEXT WEEK AND HE NEEDS TO KNOW IF THE TRIP SHOULD BE CANCELLED. HE SAID DR. GREENE IS AWARE OF HIS TRIP. HE STATED THE RESULTS ARE ON MY-CHART BUT HE DOES NOT UNDERSTAND IT. BEST PHONE [phone] (CELL) Kevin Evans

## 2020-04-24 NOTE — Telephone Encounter (Signed)
If you would comment when you have the chance and then I can call and discuss with him! Thank you!

## 2020-04-24 NOTE — Telephone Encounter (Signed)
Called with results.

## 2020-04-25 ENCOUNTER — Other Ambulatory Visit: Payer: Self-pay | Admitting: Family Medicine

## 2020-04-25 DIAGNOSIS — D373 Neoplasm of uncertain behavior of appendix: Secondary | ICD-10-CM

## 2020-04-25 NOTE — Progress Notes (Unsigned)
See lab note.  I will order CA19-9, CEA, CA 125 for tumor eval, refer to general surgery, and will need to discuss possible repeat colonoscopy with Dr. Hilarie Fredrickson.  Lab only visit ok.

## 2020-04-26 NOTE — Telephone Encounter (Signed)
See CT scan results, discussed with patient.  Discussed with general surgery.  Will need general surgical evaluation, potentially may need partial colectomy of affected area.  Did discuss potential colonoscopy prior to that surgery, and I sent message to previous gastroenterologist.  Will check tumor markers with lab only visit to have available at surgical appointment.  Patient plans to delay his travel at this time until further information known about this abnormality and plans for treatment.  All questions were answered and understanding of plan expressed.

## 2020-04-28 ENCOUNTER — Encounter: Payer: Self-pay | Admitting: Family Medicine

## 2020-04-30 ENCOUNTER — Telehealth: Payer: Self-pay

## 2020-04-30 NOTE — Telephone Encounter (Signed)
Pt scheduled for previsit 05/12/20@8am , colon scheduled in the [place] for 05/27/20 at 10am. Pt aware of appts.

## 2020-04-30 NOTE — Telephone Encounter (Signed)
-----   Message from Jerene Bears, MD sent at 04/28/2020  8:58 AM EST ----- Merry Proud I am happy to repeat colonoscopy if felt needed by surgery consultant. I should be able to make this happy quickly Ether Griffins, if patient agreeable please arrange [place] colonoscopy ASAP for abnl CT abd. Thanks JMP  ----- Message ----- From: Wendie Agreste, MD Sent: 04/25/2020   7:41 PM EST To: Jerene Bears, MD   Dr. Hilarie Fredrickson,   This is a mutual patient that had his last colonoscopy in 2017.  Recently noted a right lower quadrant mass which appears to be possible appendiceal mucinous neoplasm.  I discussed that with surgery today and they were recommending possible colonoscopy prior to surgery but wanted to run it by you.  I am checking a CA 125, CA 19-9, CEA prior to the surgical eval.  Let me know if you need a new referral or your thoughts.  Janeann Forehand

## 2020-05-12 ENCOUNTER — Ambulatory Visit: Payer: Self-pay | Admitting: General Surgery

## 2020-05-12 ENCOUNTER — Ambulatory Visit (AMBULATORY_SURGERY_CENTER): Payer: Self-pay | Admitting: *Deleted

## 2020-05-12 ENCOUNTER — Other Ambulatory Visit: Payer: Self-pay

## 2020-05-12 VITALS — Ht 76.0 in | Wt 193.0 lb

## 2020-05-12 DIAGNOSIS — R9389 Abnormal findings on diagnostic imaging of other specified body structures: Secondary | ICD-10-CM

## 2020-05-12 MED ORDER — PLENVU 140 G PO SOLR: 1.0000 | ORAL | 0 refills | Status: DC

## 2020-05-12 NOTE — H&P (Signed)
The patient is a 50 year old male who presents with a colonic mass. 50 year old male who underwent recent physical exam showing an abdominal wall mass. An ultrasound and subsequently a CT scan was obtained for evaluation. Ultrasound showed a large solid indeterminate mass in the right lower quadrant measuring at least 10 cm. CT scan showed a large tubular cystic lesion in the right paracolic gutter, highly suspicious for an appendiceal mucinous neoplasm. There was no evidence of metastatic disease. He is scheduled to undergo a colonoscopy with Dr. Hilarie Fredrickson on March 1. He denies any abdominal wall pain or changes in bowel habits. He had never noticed this mass before his doctor found it on exam. He denies any weight loss. He has no family history of colon cancer.   Past Surgical History Janeann Forehand, CNA; 05/12/2020 9:21 AM) No pertinent past surgical history  Diagnostic Studies History Janeann Forehand, CNA; 05/12/2020 9:21 AM) Colonoscopy 1-5 years ago  Allergies Janeann Forehand, CNA; 05/12/2020 9:24 AM) Bacitracin *ANTI-INFECTIVE AGENTS - MISC.*  Medication History Janeann Forehand, CNA; 05/12/2020 9:25 AM) Atorvastatin Calcium (10MG  Tablet, Oral) Active. Doxycycline Hyclate (100MG  Tablet, Oral) Active. Jardiance (10MG  Tablet, Oral) Active. OneTouch Delica Plus [id number] Active. metFORMIN HCl (1000MG  Tablet, Oral) Active. OneTouch Verio (In Vitro) Active. OneTouch Verio [employer] (w/Device Kit,) Active. Triamcinolone Acetonide (0.1% Cream, External) Active. Medications Reconciled  Social History Janeann Forehand, CNA; 05/12/2020 9:21 AM) Caffeine use Carbonated beverages, Coffee, Tea. No alcohol use No drug use Tobacco use Never smoker.  Family History Janeann Forehand, CNA; 05/12/2020 9:21 AM) Diabetes Mellitus Brother. Heart Disease Father. Hypertension Brother, Father, Sister.  Other Problems Janeann Forehand, CNA; 05/12/2020 9:21 AM) Back  Pain Diabetes Mellitus Gastroesophageal Reflux Disease Hemorrhoids Hypercholesterolemia     Review of Systems Janeann Forehand CNA; 05/12/2020 9:21 AM) Skin Present- Rash. Not Present- Change in Wart/Mole, Dryness, Hives, Jaundice, New Lesions, Non-Healing Wounds and Ulcer. HEENT Present- Wears glasses/contact lenses. Not Present- Earache, Hearing Loss, Hoarseness, Nose Bleed, Oral Ulcers, Ringing in the Ears, Seasonal Allergies, Sinus Pain, Sore Throat, Visual Disturbances and Yellow Eyes. Respiratory Not Present- Bloody sputum, Chronic Cough, Difficulty Breathing, Snoring and Wheezing. Breast Not Present- Breast Mass, Breast Pain, Nipple Discharge and Skin Changes. Cardiovascular Not Present- Chest Pain, Difficulty Breathing Lying Down, Leg Cramps, Palpitations, Rapid Heart Rate, Shortness of Breath and Swelling of Extremities. Gastrointestinal Present- Constipation, Excessive gas and Rectal Pain. Not Present- Abdominal Pain, Bloating, Bloody Stool, Change in Bowel Habits, Chronic diarrhea, Difficulty Swallowing, Gets full quickly at meals, Hemorrhoids, Indigestion, Nausea and Vomiting. Male Genitourinary Present- Change in Urinary Stream. Not Present- Blood in Urine, Frequency, Impotence, Nocturia, Painful Urination, Urgency and Urine Leakage. Musculoskeletal Not Present- Back Pain, Joint Pain, Joint Stiffness, Muscle Pain, Muscle Weakness and Swelling of Extremities. Neurological Not Present- Decreased Memory, Fainting, Headaches, Numbness, Seizures, Tingling, Tremor, Trouble walking and Weakness. Psychiatric Not Present- Anxiety, Bipolar, Change in Sleep Pattern, Depression, Fearful and Frequent crying. Endocrine Not Present- Cold Intolerance, Excessive Hunger, Hair Changes, Heat Intolerance, Hot flashes and New Diabetes. Hematology Not Present- Blood Thinners, Easy Bruising, Excessive bleeding, Gland problems, HIV and Persistent Infections.  Vitals Adriana Reams [place] CNA; 05/12/2020  9:25 AM) 05/12/2020 9:25 AM Weight: 192.5 lb Height: 76in Body Surface Area: 2.18 m Body Mass Index: 23.43 kg/m  Temp.: 97.62F  Pulse: 85 (Regular)  P.OX: 98% (Room air) BP: 132/80(Sitting, Left Arm, Standard)   Physical Exam Leighton Ruff MD; 5/95/6387 9:53 AM)  General Mental Status-Alert. General Appearance-Cooperative.  Abdomen Palpation/Percussion Abdominal Mass Palpable - Location -  Right Lower Quadrant.    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 CT scan shows a fluid-filled, enlarged appendix. He is scheduled to undergo a colonoscopy in approximately 2 weeks. Once this is complete, we will proceed with laparoscopic appendectomy for pathologic evaluation. We discussed a small risk of need for additional surgery if a malignant tumor is identified in the appendix. We discussed the surgery in detail including risk of bleeding, infection, hernia, damage to adjacent structures, and anastomotic leak. All questions were answered. We discussed the typical time off work, that he will need and healing time after surgery. We will plan on him being in the hospital at least overnight.

## 2020-05-12 NOTE — Progress Notes (Signed)
No egg or soy allergy known to patient  No issues with past sedation with any surgeries or procedures No intubation problems in the past  No FH of Malignant Hyperthermia No diet pills per patient No home 02 use per patient  No blood thinners per patient  Pt STATES  issues with constipation occ- not a daily issue  No A fib or A flutter  EMMI video to pt or via [place] 19 guidelines implemented in PV today with Pt and RN  Pt is fully vaccinated  for Covid  Pt states missing teeth, denies dentures, partials, dental implants, capped or bonded teeth  PLENVU  Coupon given to pt in PV today , Code to Pharmacy and  NO PA's for preps discussed with pt In PV today  Discussed with pt there will be an out-of-pocket cost for prep and that varies from $0 to 70 dollars   Due to the COVID-19 pandemic we are asking patients to follow certain guidelines.  Pt aware of COVID protocols and [facility] guidelines

## 2020-05-27 ENCOUNTER — Ambulatory Visit (AMBULATORY_SURGERY_CENTER): Payer: 59 | Admitting: Internal Medicine

## 2020-05-27 ENCOUNTER — Other Ambulatory Visit: Payer: Self-pay

## 2020-05-27 ENCOUNTER — Encounter: Payer: Self-pay | Admitting: Internal Medicine

## 2020-05-27 VITALS — BP 100/70 | HR 59 | Temp 97.9°F | Resp 16 | Ht 76.0 in | Wt 193.0 lb

## 2020-05-27 DIAGNOSIS — K648 Other hemorrhoids: Secondary | ICD-10-CM

## 2020-05-27 DIAGNOSIS — R935 Abnormal findings on diagnostic imaging of other abdominal regions, including retroperitoneum: Secondary | ICD-10-CM

## 2020-05-27 MED ORDER — SODIUM CHLORIDE 0.9 % IV SOLN: 500.0000 mL | Freq: Once | INTRAVENOUS | Status: DC

## 2020-05-27 NOTE — Progress Notes (Addendum)
AR - Check-in  CW - VS  Pt's states no medical or surgical changes since previsit or office visit.  Pt reported he did have bread in broth yesterday at 14:00. He reports clear yellow liquid on the results of Plenvu. maw

## 2020-05-27 NOTE — Patient Instructions (Signed)
YOU HAD AN ENDOSCOPIC PROCEDURE TODAY AT THE [facility]:   Refer to the procedure report that was given to you for any specific questions about what was found during the examination.  If the procedure report does not answer your questions, please call your gastroenterologist to clarify.  If you requested that your care partner not be given the details of your procedure findings, then the procedure report has been included in a sealed envelope for you to review at your convenience later.  YOU SHOULD EXPECT: Some feelings of bloating in the abdomen. Passage of more gas than usual.  Walking can help get rid of the air that was put into your GI tract during the procedure and reduce the bloating. If you had a lower endoscopy (such as a colonoscopy or flexible sigmoidoscopy) you may notice spotting of blood in your stool or on the toilet paper. If you underwent a bowel prep for your procedure, you may not have a normal bowel movement for a few days.  Please Note:  You might notice some irritation and congestion in your nose or some drainage.  This is from the oxygen used during your procedure.  There is no need for concern and it should clear up in a day or so.  SYMPTOMS TO REPORT IMMEDIATELY:   Following lower endoscopy (colonoscopy or flexible sigmoidoscopy):  Excessive amounts of blood in the stool  Significant tenderness or worsening of abdominal pains  Swelling of the abdomen that is new, acute  Fever of 100F or higher  For urgent or emergent issues, a gastroenterologist can be reached at any hour by calling [phone]. Do not use MyChart messaging for urgent concerns.    DIET:  We do recommend a small meal at first, but then you may proceed to your regular diet.  Drink plenty of fluids but you should avoid alcoholic beverages for 24 hours.  ACTIVITY:  You should plan to take it easy for the rest of today and you should NOT DRIVE or use heavy machinery until tomorrow (because  of the sedation medicines used during the test).    FOLLOW UP: Our staff will call the number listed on your records 48-72 hours following your procedure to check on you and address any questions or concerns that you may have regarding the information given to you following your procedure. If we do not reach you, we will leave a message.  We will attempt to reach you two times.  During this call, we will ask if you have developed any symptoms of COVID 19. If you develop any symptoms (ie: fever, flu-like symptoms, shortness of breath, cough etc.) before then, please call [phone].  If you test positive for Covid 19 in the 2 weeks post procedure, please call and report this information to us.    If any biopsies were taken you will be contacted by phone or by letter within the next 1-3 weeks.  Please call us at [phone] if you have not heard about the biopsies in 3 weeks.    SIGNATURES/CONFIDENTIALITY: You and/or your care partner have signed paperwork which will be entered into your electronic medical record.  These signatures attest to the fact that that the information above on your After Visit Summary has been reviewed and is understood.  Full responsibility of the confidentiality of this discharge information lies with you and/or your care-partner. 

## 2020-05-27 NOTE — Progress Notes (Signed)
Report given to PACU, vss 

## 2020-05-27 NOTE — Op Note (Signed)
[place] Patient Name: Kevin Evans Procedure Date: 05/27/2020 10:09 AM MRN: [id number] Endoscopist: Jerene Bears , MD Age: 50 Referring MD:  Date of Birth: 05-17-1970 Gender: Male Account #: [id number] Procedure:                Colonoscopy Indications:              Abnormal CT of the GI tract shows appendiceal mass                            suspicious for appendiceal neoplasm Medicines:                Monitored Anesthesia Care Procedure:                Pre-Anesthesia Assessment:                           - Prior to the procedure, a History and Physical                            was performed, and patient medications and                            allergies were reviewed. The patient's tolerance of                            previous anesthesia was also reviewed. The risks                            and benefits of the procedure and the sedation                            options and risks were discussed with the patient.                            All questions were answered, and informed consent                            was obtained. Prior Anticoagulants: The patient has                            taken no previous anticoagulant or antiplatelet                            agents. ASA Grade Assessment: II - A patient with                            mild systemic disease. After reviewing the risks                            and benefits, the patient was deemed in                            satisfactory condition to undergo the procedure.  After obtaining informed consent, the colonoscope                            was passed under direct vision. Throughout the                            procedure, the patient's blood pressure, pulse, and                            oxygen saturations were monitored continuously. The                            Olympus CF-HQ190 [id number]) Colonoscope was                            introduced through the anus and  advanced to the                            cecum, identified by appendiceal orifice and                            ileocecal valve. The colonoscopy was performed                            without difficulty. The patient tolerated the                            procedure well. The quality of the bowel                            preparation was excellent. The ileocecal valve,                            appendiceal orifice, and rectum were photographed. Scope In: 10:24:29 AM Scope Out: 10:39:00 AM Scope Withdrawal Time: 0 hours 10 minutes 57 seconds  Total Procedure Duration: 0 hours 14 minutes 31 seconds  Findings:                 The digital rectal exam was normal.                           A submucosal non-obstructing large mass was found                            in the cecum. There is not a cecal mucosal                            component to this lesion. No bleeding was present.                            Biopsies were not performed given plans for                            surgical resection later this month.  Internal hemorrhoids were found during                            retroflexion. The hemorrhoids were small.                           The exam was otherwise without abnormality. Complications:            No immediate complications. Estimated Blood Loss:     Estimated blood loss: none. Impression:               - Submucosal mass in the cecum under the                            appendiceal orifice. This lesion does not appear to                            arise from the cecum.                           - Small internal hemorrhoids.                           - The examination was otherwise normal.                           - No specimens collected. Recommendation:           - Patient has a contact number available for                            emergencies. The signs and symptoms of potential                            delayed complications were  discussed with the                            patient. Return to normal activities tomorrow.                            Written discharge instructions were provided to the                            patient.                           - Resume previous diet.                           - Continue present medications.                           - Repeat colonoscopy in 10 years for screening                            purposes (perhaps sooner depending on pathology  results after removal of appendiceal mass lesion). Jerene Bears, MD 05/27/2020 10:45:04 AM This report has been signed electronically. CC Letter to:             Leighton Ruff, MD

## 2020-05-29 ENCOUNTER — Telehealth: Payer: Self-pay | Admitting: *Deleted

## 2020-05-29 ENCOUNTER — Telehealth: Payer: Self-pay

## 2020-05-29 NOTE — Telephone Encounter (Signed)
  Follow up Call-  Call back number 05/27/2020  Post procedure Call Back phone  # [phone] cell  Permission to leave phone message Yes  Some recent data might be hidden     No answer, mailbox was full

## 2020-05-29 NOTE — Telephone Encounter (Signed)
  Follow up Call-  Call back number 05/27/2020  Post procedure Call Back phone  # [phone] cell  Permission to leave phone message Yes  Some recent data might be hidden     Patient questions:  Do you have a fever, pain , or abdominal swelling? No. Pain Score  0 *  Have you tolerated food without any problems? Yes.    Have you been able to return to your normal activities? Yes.    Do you have any questions about your discharge instructions: Diet   No. Medications  No. Follow up visit  No.  Do you have questions or concerns about your Care? No.  Actions: * If pain score is 4 or above: No action needed, pain <4.  1. Have you developed a fever since your procedure? no  2.   Have you had an respiratory symptoms (SOB or cough) since your procedure? no  3.   Have you tested positive for COVID 19 since your procedure no  4.   Have you had any family members/close contacts diagnosed with the COVID 19 since your procedure?  no   If yes to any of these questions please route to Joylene John, RN and Joella Prince, RN

## 2020-06-06 ENCOUNTER — Encounter: Payer: Self-pay | Admitting: Family Medicine

## 2020-06-06 NOTE — Patient Instructions (Signed)
DUE TO COVID-19 ONLY ONE VISITOR IS ALLOWED TO COME WITH YOU AND STAY IN THE WAITING ROOM ONLY DURING PRE OP AND PROCEDURE DAY OF SURGERY.   TWO  VISITOR  MAY VISIT WITH YOU AFTER SURGERY IN YOUR PRIVATE ROOM DURING VISITING HOURS ONLY!  YOU NEED TO HAVE A COVID 19 TEST ON__3-22-22_____ @_______ , THIS TEST MUST BE DONE BEFORE SURGERY,  COVID TESTING SITE [address] [place] [address], IT IS ON THE RIGHT GOING OUT [address] APPROXIMATELY  2 MINUTES PAST [employer] ON THE RIGHT. ONCE YOUR COVID TEST IS COMPLETED,  PLEASE BEGIN THE QUARANTINE INSTRUCTIONS AS OUTLINED IN YOUR HANDOUT.                Bunny Lowdermilk  06/06/2020   Your procedure is scheduled on: 06-20-20   Report to [facility] Main  Entrance   Report to admitting at       0530 AM     Call this number if you have problems the morning of surgery [phone]    Remember: Do not eat food  :After Midnight. You may have clear liquids until        0430 am then nothing by mouth    CLEAR LIQUID DIET                                                         Black Coffee and tea, regular and decaf                             Plain Jell-O any favor except red or purple                                            Fruit ices (not with fruit pulp)                                       Iced Popsicles                                     Carbonated beverages, regular and diet                                    Cranberry, grape and apple juices Sports drinks like Gatorade Lightly seasoned clear broth or consume(fat free) Sugar, honey syrup  _____________________________________________________________________     BRUSH YOUR TEETH MORNING OF SURGERY AND RINSE YOUR MOUTH OUT, NO CHEWING GUM CANDY OR MINTS.     Take these medicines the morning of surgery with A SIP OF WATER: lipitor  Hol Jardiance day before surgery  DO NOT TAKE ANY DIABETIC MEDICATIONS DAY OF YOUR SURGERY                                You may not have any metal on your body including hair pins and  piercings  Do not wear jewelry,lotions, powders or perfumes, deodorant              Men may shave face and neck.   Do not bring valuables to the hospital. [place].  Contacts, dentures or bridgework may not be worn into surgery.      Patients discharged the day of surgery will not be allowed to drive home. IF YOU ARE HAVING SURGERY AND GOING HOME THE SAME DAY, YOU MUST HAVE AN ADULT TO DRIVE YOU HOME AND BE WITH YOU FOR 24 HOURS. YOU MAY GO HOME BY TAXI OR UBER OR ORTHERWISE, BUT AN ADULT MUST ACCOMPANY YOU HOME AND STAY WITH YOU FOR 24 HOURS.  Name and phone number of your driver:  Special Instructions: N/A              Please read over the following fact sheets you were given: _____________________________________________________________________             [facility] - Preparing for Surgery Before surgery, you can play an important role.  Because skin is not sterile, your skin needs to be as free of germs as possible.  You can reduce the number of germs on your skin by washing with CHG (chlorahexidine gluconate) soap before surgery.  CHG is an antiseptic cleaner which kills germs and bonds with the skin to continue killing germs even after washing. Please DO NOT use if you have an allergy to CHG or antibacterial soaps.  If your skin becomes reddened/irritated stop using the CHG and inform your nurse when you arrive at Short Stay. Do not shave (including legs and underarms) for at least 48 hours prior to the first CHG shower.  You may shave your face/neck. Please follow these instructions carefully:  1.  Shower with CHG Soap the night before surgery and the  morning of Surgery.  2.  If you choose to wash your hair, wash your hair first as usual with your  normal  shampoo.  3.  After you shampoo, rinse your hair and body thoroughly to remove the  shampoo.                            4.  Use CHG as you would any other liquid soap.  You can apply chg directly  to the skin and wash                       Gently with a scrungie or clean washcloth.  5.  Apply the CHG Soap to your body ONLY FROM THE NECK DOWN.   Do not use on face/ open                           Wound or open sores. Avoid contact with eyes, ears mouth and genitals (private parts).                       Wash face,  Genitals (private parts) with your normal soap.             6.  Wash thoroughly, paying special attention to the area where your surgery  will be performed.  7.  Thoroughly rinse your body with warm water from the neck down.  8.  DO NOT  shower/wash with your normal soap after using and rinsing off  the CHG Soap.                9.  Pat yourself dry with a clean towel.            10.  Wear clean pajamas.            11.  Place clean sheets on your bed the night of your first shower and do not  sleep with pets. Day of Surgery : Do not apply any lotions/deodorants the morning of surgery.  Please wear clean clothes to the hospital/surgery center.  FAILURE TO FOLLOW THESE INSTRUCTIONS MAY RESULT IN THE CANCELLATION OF YOUR SURGERY PATIENT SIGNATURE_________________________________  NURSE SIGNATURE__________________________________  ________________________________________________________________________

## 2020-06-06 NOTE — Progress Notes (Addendum)
PCP - Dr. Cindee Lame Cardiologist - no  PPM/ICD -  Device Orders -  Rep Notified -   Chest x-ray -  EKG - 06-09-20 Stress Test -  ECHO -  Cardiac Cath -   Sleep Study -  CPAP -   Fasting Blood Sugar - 125 Checks Blood Sugar _1____ times a day  Blood Thinner Instructions: Aspirin Instructions:  ERAS Protcol - PRE-SURGERY Ensure or G2-   COVID TEST- 3-22  Activity--Able to walk a flight of stairs without SOB  Anesthesia review: HTN DM  Patient denies shortness of breath, fever, cough and chest pain at PAT appointment   All instructions explained to the patient, with a verbal understanding of the material. Patient agrees to go over the instructions while at home for a better understanding. Patient also instructed to self quarantine after being tested for COVID-19. The opportunity to ask questions was provided.

## 2020-06-09 ENCOUNTER — Other Ambulatory Visit: Payer: Self-pay

## 2020-06-09 ENCOUNTER — Encounter (HOSPITAL_COMMUNITY)
Admission: RE | Admit: 2020-06-09 | Discharge: 2020-06-09 | Disposition: A | Discharge status: Home / Self Care | Payer: 59 | Source: Ambulatory Visit | Attending: General Surgery | Admitting: General Surgery

## 2020-06-09 ENCOUNTER — Encounter (HOSPITAL_COMMUNITY): Payer: Self-pay

## 2020-06-09 DIAGNOSIS — I4510 Unspecified right bundle-branch block: Secondary | ICD-10-CM | POA: Insufficient documentation

## 2020-06-09 DIAGNOSIS — Z01818 Encounter for other preprocedural examination: Secondary | ICD-10-CM | POA: Insufficient documentation

## 2020-06-09 HISTORY — DX: Essential (primary) hypertension: I10

## 2020-06-09 LAB — CBC
HCT: 46.3 % (ref 39.0–52.0)
Hemoglobin: 15.1 g/dL (ref 13.0–17.0)
MCH: 27.2 pg (ref 26.0–34.0)
MCHC: 32.6 g/dL (ref 30.0–36.0)
MCV: 83.3 fL (ref 80.0–100.0)
Platelets: 234 10*3/uL (ref 150–400)
RBC: 5.56 MIL/uL (ref 4.22–5.81)
RDW: 12.6 % (ref 11.5–15.5)
WBC: 3.2 10*3/uL — ABNORMAL LOW (ref 4.0–10.5)
nRBC: 0 % (ref 0.0–0.2)

## 2020-06-09 LAB — BASIC METABOLIC PANEL
Anion gap: 8 (ref 5–15)
BUN: 13 mg/dL (ref 6–20)
CO2: 27 mmol/L (ref 22–32)
Calcium: 9.6 mg/dL (ref 8.9–10.3)
Chloride: 103 mmol/L (ref 98–111)
Creatinine, Ser: 0.96 mg/dL (ref 0.61–1.24)
GFR, Estimated: >60 mL/min (ref >60)
Glucose, Bld: 93 mg/dL (ref 70–99)
Potassium: 4.1 mmol/L (ref 3.5–5.1)
Sodium: 138 mmol/L (ref 135–145)

## 2020-06-09 LAB — GLUCOSE, CAPILLARY: Glucose-Capillary: 90 mg/dL (ref 70–99)

## 2020-06-09 LAB — HEMOGLOBIN A1C
Hgb A1c MFr Bld: 6.3 % — ABNORMAL HIGH (ref 4.8–5.6)
Mean Plasma Glucose: 134.11 mg/dL

## 2020-06-10 ENCOUNTER — Ambulatory Visit: Payer: Self-pay | Admitting: General Surgery

## 2020-06-10 LAB — CEA: CEA: 26.6 ng/mL — ABNORMAL HIGH (ref 0.0–4.7)

## 2020-06-11 ENCOUNTER — Other Ambulatory Visit: Payer: Self-pay

## 2020-06-11 ENCOUNTER — Telehealth (INDEPENDENT_AMBULATORY_CARE_PROVIDER_SITE_OTHER): Payer: 59 | Admitting: Family Medicine

## 2020-06-11 DIAGNOSIS — L739 Follicular disorder, unspecified: Secondary | ICD-10-CM

## 2020-06-11 DIAGNOSIS — R238 Other skin changes: Secondary | ICD-10-CM | POA: Diagnosis not present

## 2020-06-11 DIAGNOSIS — R1903 Right lower quadrant abdominal swelling, mass and lump: Secondary | ICD-10-CM

## 2020-06-11 DIAGNOSIS — R970 Elevated carcinoembryonic antigen [CEA]: Secondary | ICD-10-CM

## 2020-06-11 MED ORDER — TRIAMCINOLONE ACETONIDE 0.1 % EX CREA: 1.0000 "application " | TOPICAL_CREAM | Freq: Two times a day (BID) | CUTANEOUS | 0 refills | Status: DC

## 2020-06-11 NOTE — Progress Notes (Signed)
Virtual Visit via Video Note  I connected with Kevin Evans on 06/11/20 at 12:17 PM by a video enabled telemedicine application and verified that I am speaking with the correct person using two identifiers.  Patient location:in his truck.  My location: office  10 min chart review.  I discussed the limitations, risks, security and privacy concerns of performing an evaluation and management service by telephone and the availability of in person appointments. I also discussed with the patient that there may be a patient responsible charge related to this service. The patient expressed understanding and agreed to proceed, consent obtained  Chief complaint:  Chief Complaint  Patient presents with  . surgical questions    Called pt and he stated he wanted more detail on the surgical procedure he is getting done 06/20/20.    History of Present Illness: Kevin Evans is a 50 y.o. male  Abdominal mass: Approximately 8cm firm nontender right lower quadrant abdominal mass was noted on his exam January 3.  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  Surgical consultation recommended.  Discussed with general surgery, he also recommended gastroenterology eval for colonoscopy prior to that surgery.   Colonoscopy March 1 by Dr. Hilarie Fredrickson.  Submucosal mass in the cecum under the appendiceal orifice.  Did not appear to arise from the cecum.  Small internal hemorrhoids.  Exam otherwise normal.  Surgical eval February 14 with Dr. Marcello Moores, plan for laparoscopic appendectomy for pathologic evaluation then small risk for additional surgery if malignant tumor identified.  Surgery planned for March 25.  Had testing done by surgery few days ago. Told that may have colon CA? Patient was called yesterday and plan for possible R colectomy due to those results. Elevated CEA level of 26.6 on 3/14. He is ok with this  plan.  Scalp irritation See message March 11.  Used a new razor to shave his scalp.  Cause irritation throughout the scalp, see photo form few days ago.  Itching and some discharge from bumps at times.  Shaves head once per day or every 2 days.  Tried few shave bump treatments. Rash is getting better.  Has doxycycline at home - planned travel - did not travel so did not take.    Patient Active Problem List   Diagnosis Date Noted  . Hyperlipidemia 03/09/2017  . Diabetes ([place]) 04/08/2015   Past Medical History:  Diagnosis Date  . Diabetes mellitus without complication ([place])    type 2  . GERD (gastroesophageal reflux disease)    hx of  . HLD (hyperlipidemia)   . Hyperlipidemia    Phreesia 06/10/2020  . Hypertension    No meds pt. denies at preop   Past Surgical History:  Procedure Laterality Date  . COLONOSCOPY    . NO PAST SURGERIES    . UPPER GASTROINTESTINAL ENDOSCOPY     Allergies  Allergen Reactions  . Other Itching and Swelling  . Bactrim [Sulfamethoxazole-Trimethoprim] Itching    Itching all over body   . Shrimp (Diagnostic)     Under cooked shrimp, facial swelling   Prior to Admission medications   Medication Sig Start Date End Date Taking? Authorizing Provider  atorvastatin (LIPITOR) 10 MG tablet Take 1 tablet (10 mg total) by mouth daily. 03/31/20  Yes Wendie Agreste, MD  blood glucose meter kit and supplies Dispense based on patient and insurance preference.check up to once per day. 01/01/20  Yes Horald Pollen, MD  Blood Glucose Monitoring Suppl w/Device KIT Monitor glucose twice daily 07/04/15  Yes Bush, Benjaman Pott, PA-C  doxycycline (VIBRA-TABS) 100 MG tablet Take 1 tablet (100 mg total) by mouth daily. Start day before travel, continue 4 weeks after. 03/31/20  Yes Wendie Agreste, MD  empagliflozin (JARDIANCE) 10 MG TABS tablet Take 1 tablet (10 mg total) by mouth daily. 03/31/20  Yes Wendie Agreste, MD  metFORMIN (GLUCOPHAGE) 1000 MG tablet TAKE 1  TABLET(1000 MG) BY MOUTH TWICE DAILY WITH A MEAL Patient taking differently: Take 1,000 mg by mouth 2 (two) times daily with a meal. TAKE 1 TABLET(1000 MG) BY MOUTH TWICE DAILY WITH A MEAL 03/31/20  Yes Wendie Agreste, MD  diphenhydrAMINE (BENADRYL) 25 MG tablet Take 12.5-25 mg by mouth at bedtime as needed for sleep.    [provider]  ibuprofen (ADVIL) 200 MG tablet Take 400 mg by mouth daily as needed for headache.    [provider]   Social History   Socioeconomic History  . Marital status: Single    Spouse name: Not on file  . Number of children: 0  . Years of education: Not on file  . Highest education level: Not on file  Occupational History  . Occupation: truck Geophysicist/field seismologist  Tobacco Use  . Smoking status: Never Smoker  . Smokeless tobacco: Never Used  Vaping Use  . Vaping Use: Never used  Substance and Sexual Activity  . Alcohol use: No    Alcohol/week: 0.0 standard drinks  . Drug use: No  . Sexual activity: Yes    Birth control/protection: Condom  Other Topics Concern  . Not on file  Social History Narrative  . Not on file   Social Determinants of Health   Financial Resource Strain: Not on file  Food Insecurity: Not on file  Transportation Needs: Not on file  Physical Activity: Not on file  Stress: Not on file  Social Connections: Not on file  Intimate Partner Violence: Not on file    Observations/Objective: There were no vitals filed for this visit. No distress, appropriate responses, normal respiratory effort, no distress.  All questions were answered and understanding of plan expressed.     Assessment and Plan: Right lower quadrant abdominal mass Elevated CEA  -Plan for appendectomy now with right colectomy, elevated CEA.  Questions were answered as best as possible but will also review notes from general surgery and advised him to contact surgery if further questions prior to his upcoming procedure.  Understanding expressed.    Scalp  irritation Folliculitis  -Differential includes pseudofolliculitis barbae, but with some episodic discharge from areas, folliculitis also possible.  Has leftover doxycycline that was prescribed recently for malaria prophylaxis that he did not take.  100 mg twice daily for 7 to 10 days discussed if persistent folliculitis symptoms.  Triamcinolone 0.1% twice daily topical as needed for irritation with RTC precautions given.  Follow Up Instructions:  Patient Instructions     Bumps on the scalp and face can be due to irritation from shaving.  Do try to minimize how often you shave those areas if it does start to cause some irritation.  You can try the steroid cream up to twice per day for areas of irritation but if you continue to have bumps or pus pockets start the antibiotic doxycycline 100 mg twice per day for a week to 10 days. Return to the clinic or go to the nearest emergency room if any  of your symptoms worsen or new symptoms occur.  Keep follow-up for the surgery as planned with Dr. Marcello Moores but let me or her office know if there are questions in the meantime.    Folliculitis  Folliculitis is inflammation of the hair follicles. Folliculitis most commonly occurs on the scalp, thighs, legs, back, and buttocks. However, it can occur anywhere on the body. What are the causes? This condition may be caused by:  A bacterial infection (common).  A fungal infection.  A viral infection.  Contact with certain chemicals, especially oils and tars.  Shaving or waxing.  Greasy ointments or creams applied to the skin. Long-lasting folliculitis and folliculitis that keeps coming back may be caused by bacteria. This bacteria can live anywhere on your skin and is often found in the nostrils. What increases the risk? You are more likely to develop this condition if you have:  A weakened immune system.  Diabetes.  Obesity. What are the signs or symptoms? Symptoms of this condition  include:  Redness.  Soreness.  Swelling.  Itching.  Small white or yellow, pus-filled, itchy spots (pustules) that appear over a reddened area. If there is an infection that goes deep into the follicle, these may develop into a boil (furuncle).  A group of closely packed boils (carbuncle). These tend to form in hairy, sweaty areas of the body. How is this diagnosed? This condition is diagnosed with a skin exam. To find what is causing the condition, your health care provider may take a sample of one of the pustules or boils for testing in a lab. How is this treated? This condition may be treated by:  Applying warm compresses to the affected areas.  Taking an antibiotic medicine or applying an antibiotic medicine to the skin.  Applying or bathing with an antiseptic solution.  Taking an over-the-counter medicine to help with itching.  Having a procedure to drain any pustules or boils. This may be done if a pustule or boil contains a lot of pus or fluid.  Having laser hair removal. This may be done to treat long-lasting folliculitis. Follow these instructions at home: Managing pain and swelling  If directed, apply heat to the affected area as often as told by your health care provider. Use the heat source that your health care provider recommends, such as a moist heat pack or a heating pad. ? Place a towel between your skin and the heat source. ? Leave the heat on for 20-30 minutes. ? Remove the heat if your skin turns bright red. This is especially important if you are unable to feel pain, heat, or cold. You may have a greater risk of getting burned.   General instructions  If you were prescribed an antibiotic medicine, take it or apply it as told by your health care provider. Do not stop using the antibiotic even if your condition improves.  Check the irritated area every day for signs of infection. Check for: ? Redness, swelling, or pain. ? Fluid or blood. ? Warmth. ? Pus  or a bad smell.  Do not shave irritated skin.  Take over-the-counter and prescription medicines only as told by your health care provider.  Keep all follow-up visits as told by your health care provider. This is important. Get help right away if:  You have more redness, swelling, or pain in the affected area.  Red streaks are spreading from the affected area.  You have a fever. Summary  Folliculitis is inflammation of the hair follicles.  Folliculitis most commonly occurs on the scalp, thighs, legs, back, and buttocks.  This condition may be treated by taking an antibiotic medicine or applying an antibiotic medicine to the skin, and applying or bathing with an antiseptic solution.  If you were prescribed an antibiotic medicine, take it or apply it as told by your health care provider. Do not stop using the antibiotic even if your condition improves.  Get help right away if you have new or worsening symptoms.  Keep all follow-up visits as told by your health care provider. This is important. This information is not intended to replace advice given to you by your health care provider. Make sure you discuss any questions you have with your health care provider. Document Revised: 10/22/2017 Document Reviewed: 10/22/2017 Elsevier Patient Education  2021 [employer].     If you have lab work done today you will be contacted with your lab results within the next 2 weeks.  If you have not heard from [place] then please contact us. The fastest way to get your results is to register for My Chart.   IF you received an x-ray today, you will receive an invoice from [facility]. Please contact [facility] at [phone] with questions or concerns regarding your invoice.   IF you received labwork today, you will receive an invoice from [facility]. Please contact [facility] at [phone] with questions or concerns regarding your invoice.   Our billing staff will not be able to  assist you with questions regarding bills from these companies.  You will be contacted with the lab results as soon as they are available. The fastest way to get your results is to activate your My Chart account. Instructions are located on the last page of this paperwork. If you have not heard from [place] regarding the results in 2 weeks, please contact this office.          I discussed the assessment and treatment plan with the patient. The patient was provided an opportunity to ask questions and all were answered. The patient agreed with the plan and demonstrated an understanding of the instructions.   The patient was advised to call back or seek an in-person evaluation if the symptoms worsen or if the condition fails to improve as anticipated.  I provided 25 minutes of non-face-to-face time during this encounter and 10 min chart review.    Wendie Agreste, MD

## 2020-06-11 NOTE — Patient Instructions (Addendum)
Bumps on the scalp and face can be due to irritation from shaving.  Do try to minimize how often you shave those areas if it does start to cause some irritation.  You can try the steroid cream up to twice per day for areas of irritation but if you continue to have bumps or pus pockets start the antibiotic doxycycline 100 mg twice per day for a week to 10 days. Return to the clinic or go to the nearest emergency room if any of your symptoms worsen or new symptoms occur.  Keep follow-up for the surgery as planned with Dr. Marcello Moores but let me or her office know if there are questions in the meantime.    Folliculitis  Folliculitis is inflammation of the hair follicles. Folliculitis most commonly occurs on the scalp, thighs, legs, back, and buttocks. However, it can occur anywhere on the body. What are the causes? This condition may be caused by:  A bacterial infection (common).  A fungal infection.  A viral infection.  Contact with certain chemicals, especially oils and tars.  Shaving or waxing.  Greasy ointments or creams applied to the skin. Long-lasting folliculitis and folliculitis that keeps coming back may be caused by bacteria. This bacteria can live anywhere on your skin and is often found in the nostrils. What increases the risk? You are more likely to develop this condition if you have:  A weakened immune system.  Diabetes.  Obesity. What are the signs or symptoms? Symptoms of this condition include:  Redness.  Soreness.  Swelling.  Itching.  Small white or yellow, pus-filled, itchy spots (pustules) that appear over a reddened area. If there is an infection that goes deep into the follicle, these may develop into a boil (furuncle).  A group of closely packed boils (carbuncle). These tend to form in hairy, sweaty areas of the body. How is this diagnosed? This condition is diagnosed with a skin exam. To find what is causing the condition, your health care provider  may take a sample of one of the pustules or boils for testing in a lab. How is this treated? This condition may be treated by:  Applying warm compresses to the affected areas.  Taking an antibiotic medicine or applying an antibiotic medicine to the skin.  Applying or bathing with an antiseptic solution.  Taking an over-the-counter medicine to help with itching.  Having a procedure to drain any pustules or boils. This may be done if a pustule or boil contains a lot of pus or fluid.  Having laser hair removal. This may be done to treat long-lasting folliculitis. Follow these instructions at home: Managing pain and swelling  If directed, apply heat to the affected area as often as told by your health care provider. Use the heat source that your health care provider recommends, such as a moist heat pack or a heating pad. ? Place a towel between your skin and the heat source. ? Leave the heat on for 20-30 minutes. ? Remove the heat if your skin turns bright red. This is especially important if you are unable to feel pain, heat, or cold. You may have a greater risk of getting burned.   General instructions  If you were prescribed an antibiotic medicine, take it or apply it as told by your health care provider. Do not stop using the antibiotic even if your condition improves.  Check the irritated area every day for signs of infection. Check for: ? Redness, swelling, or pain. ?  Fluid or blood. ? Warmth. ? Pus or a bad smell.  Do not shave irritated skin.  Take over-the-counter and prescription medicines only as told by your health care provider.  Keep all follow-up visits as told by your health care provider. This is important. Get help right away if:  You have more redness, swelling, or pain in the affected area.  Red streaks are spreading from the affected area.  You have a fever. Summary  Folliculitis is inflammation of the hair follicles. Folliculitis most commonly occurs on  the scalp, thighs, legs, back, and buttocks.  This condition may be treated by taking an antibiotic medicine or applying an antibiotic medicine to the skin, and applying or bathing with an antiseptic solution.  If you were prescribed an antibiotic medicine, take it or apply it as told by your health care provider. Do not stop using the antibiotic even if your condition improves.  Get help right away if you have new or worsening symptoms.  Keep all follow-up visits as told by your health care provider. This is important. This information is not intended to replace advice given to you by your health care provider. Make sure you discuss any questions you have with your health care provider. Document Revised: 10/22/2017 Document Reviewed: 10/22/2017 Elsevier Patient Education  2021 [employer].     If you have lab work done today you will be contacted with your lab results within the next 2 weeks.  If you have not heard from [place] then please contact us. The fastest way to get your results is to register for My Chart.   IF you received an x-ray today, you will receive an invoice from [facility]. Please contact [facility] at [phone] with questions or concerns regarding your invoice.   IF you received labwork today, you will receive an invoice from [place]. Please contact [facility] at [phone] with questions or concerns regarding your invoice.   Our billing staff will not be able to assist you with questions regarding bills from these companies.  You will be contacted with the lab results as soon as they are available. The fastest way to get your results is to activate your My Chart account. Instructions are located on the last page of this paperwork. If you have not heard from [place] regarding the results in 2 weeks, please contact this office.

## 2020-06-12 ENCOUNTER — Other Ambulatory Visit: Payer: Self-pay | Admitting: Family Medicine

## 2020-06-12 DIAGNOSIS — R238 Other skin changes: Secondary | ICD-10-CM

## 2020-06-12 DIAGNOSIS — L739 Follicular disorder, unspecified: Secondary | ICD-10-CM

## 2020-06-17 ENCOUNTER — Other Ambulatory Visit (HOSPITAL_COMMUNITY)
Admission: RE | Admit: 2020-06-17 | Discharge: 2020-06-17 | Disposition: A | Discharge status: Home / Self Care | Payer: 59 | Source: Ambulatory Visit | Attending: General Surgery | Admitting: General Surgery

## 2020-06-17 DIAGNOSIS — Z01812 Encounter for preprocedural laboratory examination: Secondary | ICD-10-CM | POA: Insufficient documentation

## 2020-06-17 DIAGNOSIS — Z20822 Contact with and (suspected) exposure to covid-19: Secondary | ICD-10-CM | POA: Insufficient documentation

## 2020-06-17 LAB — SARS CORONAVIRUS 2 (TAT 6-24 HRS): SARS Coronavirus 2: NEGATIVE

## 2020-06-19 MED ORDER — BUPIVACAINE LIPOSOME 1.3 % IJ SUSP
20.0000 mL | Freq: Once | INTRAMUSCULAR | Status: DC
  Filled 2020-06-19: qty 20

## 2020-06-20 ENCOUNTER — Ambulatory Visit (HOSPITAL_COMMUNITY): Payer: 59 | Admitting: Anesthesiology

## 2020-06-20 ENCOUNTER — Encounter (HOSPITAL_COMMUNITY): Payer: Self-pay | Admitting: General Surgery

## 2020-06-20 ENCOUNTER — Ambulatory Visit (HOSPITAL_COMMUNITY): Payer: 59 | Admitting: Physician Assistant

## 2020-06-20 ENCOUNTER — Encounter (HOSPITAL_COMMUNITY)
Admission: AD | Disposition: A | Discharge status: Home / Self Care | Payer: Self-pay | Source: Ambulatory Visit | Attending: General Surgery

## 2020-06-20 ENCOUNTER — Inpatient Hospital Stay (HOSPITAL_COMMUNITY)
Admission: AD | Admit: 2020-06-20 | Discharge: 2020-06-23 | DRG: 331 | Disposition: A | Discharge status: Home / Self Care | Payer: 59 | Source: Ambulatory Visit | Attending: General Surgery | Admitting: General Surgery

## 2020-06-20 ENCOUNTER — Other Ambulatory Visit: Payer: Self-pay

## 2020-06-20 DIAGNOSIS — Z8249 Family history of ischemic heart disease and other diseases of the circulatory system: Secondary | ICD-10-CM

## 2020-06-20 DIAGNOSIS — Z7984 Long term (current) use of oral hypoglycemic drugs: Secondary | ICD-10-CM

## 2020-06-20 DIAGNOSIS — D373 Neoplasm of uncertain behavior of appendix: Secondary | ICD-10-CM | POA: Diagnosis present

## 2020-06-20 DIAGNOSIS — Z20822 Contact with and (suspected) exposure to covid-19: Secondary | ICD-10-CM | POA: Diagnosis present

## 2020-06-20 DIAGNOSIS — E119 Type 2 diabetes mellitus without complications: Secondary | ICD-10-CM | POA: Diagnosis present

## 2020-06-20 DIAGNOSIS — C181 Malignant neoplasm of appendix: Secondary | ICD-10-CM | POA: Diagnosis present

## 2020-06-20 DIAGNOSIS — D490 Neoplasm of unspecified behavior of digestive system: Secondary | ICD-10-CM | POA: Diagnosis present

## 2020-06-20 DIAGNOSIS — Z833 Family history of diabetes mellitus: Secondary | ICD-10-CM | POA: Diagnosis not present

## 2020-06-20 DIAGNOSIS — E785 Hyperlipidemia, unspecified: Secondary | ICD-10-CM | POA: Diagnosis present

## 2020-06-20 HISTORY — PX: APPENDECTOMY: SHX54

## 2020-06-20 HISTORY — PX: LAPAROSCOPIC RIGHT HEMI COLECTOMY: SHX5926

## 2020-06-20 LAB — GLUCOSE, CAPILLARY
Glucose-Capillary: 117 mg/dL — ABNORMAL HIGH (ref 70–99)
Glucose-Capillary: 170 mg/dL — ABNORMAL HIGH (ref 70–99)
Glucose-Capillary: 149 mg/dL — ABNORMAL HIGH (ref 70–99)
Glucose-Capillary: 172 mg/dL — ABNORMAL HIGH (ref 70–99)
Glucose-Capillary: 151 mg/dL — ABNORMAL HIGH (ref 70–99)

## 2020-06-20 SURGERY — LAPAROSCOPIC RIGHT HEMI COLECTOMY
Anesthesia: General

## 2020-06-20 MED ORDER — GABAPENTIN 300 MG PO CAPS
300.0000 mg | ORAL_CAPSULE | Freq: Two times a day (BID) | ORAL | Status: DC
  Administered 2020-06-20 – 2020-06-23 (×6): 300 mg via ORAL
  Filled 2020-06-20 (×6): qty 1

## 2020-06-20 MED ORDER — BUPIVACAINE-EPINEPHRINE 0.25% -1:200000 IJ SOLN
INTRAMUSCULAR | Status: DC | PRN
  Administered 2020-06-20: 30 mL

## 2020-06-20 MED ORDER — OXYCODONE HCL 5 MG PO TABS: 5.0000 mg | ORAL_TABLET | Freq: Once | ORAL | Status: DC | PRN

## 2020-06-20 MED ORDER — KCL IN DEXTROSE-NACL 20-5-0.45 MEQ/L-%-% IV SOLN
INTRAVENOUS | Status: DC
  Filled 2020-06-20 (×2): qty 1000

## 2020-06-20 MED ORDER — SACCHAROMYCES BOULARDII 250 MG PO CAPS
250.0000 mg | ORAL_CAPSULE | Freq: Two times a day (BID) | ORAL | Status: DC
  Administered 2020-06-20 – 2020-06-23 (×6): 250 mg via ORAL
  Filled 2020-06-20 (×6): qty 1

## 2020-06-20 MED ORDER — ONDANSETRON HCL 4 MG/2ML IJ SOLN
INTRAMUSCULAR | Status: DC | PRN
  Administered 2020-06-20: 4 mg via INTRAVENOUS

## 2020-06-20 MED ORDER — ENOXAPARIN SODIUM 40 MG/0.4ML ~~LOC~~ SOLN
40.0000 mg | SUBCUTANEOUS | Status: DC
  Administered 2020-06-21 – 2020-06-23 (×3): 40 mg via SUBCUTANEOUS
  Filled 2020-06-20 (×3): qty 0.4

## 2020-06-20 MED ORDER — ACETAMINOPHEN 500 MG PO TABS
1000.0000 mg | ORAL_TABLET | Freq: Four times a day (QID) | ORAL | Status: DC
  Administered 2020-06-20 – 2020-06-23 (×9): 1000 mg via ORAL
  Filled 2020-06-20 (×11): qty 2

## 2020-06-20 MED ORDER — MIDAZOLAM HCL 2 MG/2ML IJ SOLN
INTRAMUSCULAR | Status: AC
  Filled 2020-06-20: qty 2

## 2020-06-20 MED ORDER — EPHEDRINE 5 MG/ML INJ
INTRAVENOUS | Status: AC
  Filled 2020-06-20: qty 10

## 2020-06-20 MED ORDER — SUGAMMADEX SODIUM 200 MG/2ML IV SOLN
INTRAVENOUS | Status: DC | PRN
  Administered 2020-06-20: 200 mg via INTRAVENOUS

## 2020-06-20 MED ORDER — ACETAMINOPHEN 500 MG PO TABS
1000.0000 mg | ORAL_TABLET | ORAL | Status: AC
  Administered 2020-06-20: 1000 mg via ORAL
  Filled 2020-06-20: qty 2

## 2020-06-20 MED ORDER — HYDROMORPHONE HCL 1 MG/ML IJ SOLN
INTRAMUSCULAR | Status: AC
  Filled 2020-06-20: qty 1

## 2020-06-20 MED ORDER — KETOROLAC TROMETHAMINE 30 MG/ML IJ SOLN
INTRAMUSCULAR | Status: AC
  Filled 2020-06-20: qty 1

## 2020-06-20 MED ORDER — CHLORHEXIDINE GLUCONATE 0.12 % MT SOLN
15.0000 mL | Freq: Once | OROMUCOSAL | Status: AC
  Administered 2020-06-20: 15 mL via OROMUCOSAL

## 2020-06-20 MED ORDER — HYDROMORPHONE HCL 1 MG/ML IJ SOLN
0.2500 mg | INTRAMUSCULAR | Status: DC | PRN
  Administered 2020-06-20 (×3): 0.5 mg via INTRAVENOUS

## 2020-06-20 MED ORDER — METOPROLOL TARTRATE 5 MG/5ML IV SOLN
5.0000 mg | Freq: Four times a day (QID) | INTRAVENOUS | Status: DC | PRN
  Filled 2020-06-20: qty 5

## 2020-06-20 MED ORDER — PROMETHAZINE HCL 25 MG/ML IJ SOLN
6.2500 mg | INTRAMUSCULAR | Status: DC | PRN
Start: 2020-06-20 — End: 2020-06-20

## 2020-06-20 MED ORDER — ROCURONIUM BROMIDE 10 MG/ML (PF) SYRINGE
PREFILLED_SYRINGE | INTRAVENOUS | Status: DC | PRN
  Administered 2020-06-20: 100 mg via INTRAVENOUS

## 2020-06-20 MED ORDER — LACTATED RINGERS IV SOLN: INTRAVENOUS | Status: DC

## 2020-06-20 MED ORDER — INSULIN ASPART 100 UNIT/ML ~~LOC~~ SOLN: 0.0000 [IU] | Freq: Every day | SUBCUTANEOUS | Status: DC

## 2020-06-20 MED ORDER — FENTANYL CITRATE (PF) 250 MCG/5ML IJ SOLN
INTRAMUSCULAR | Status: AC
  Filled 2020-06-20: qty 5

## 2020-06-20 MED ORDER — ONDANSETRON HCL 4 MG/2ML IJ SOLN
4.0000 mg | Freq: Four times a day (QID) | INTRAMUSCULAR | Status: DC | PRN
  Administered 2020-06-22: 4 mg via INTRAVENOUS
  Filled 2020-06-20: qty 2

## 2020-06-20 MED ORDER — ALVIMOPAN 12 MG PO CAPS: 12.0000 mg | ORAL_CAPSULE | Freq: Two times a day (BID) | ORAL | Status: DC

## 2020-06-20 MED ORDER — EPHEDRINE SULFATE-NACL 50-0.9 MG/10ML-% IV SOSY
PREFILLED_SYRINGE | INTRAVENOUS | Status: DC | PRN
  Administered 2020-06-20: 10 mg via INTRAVENOUS

## 2020-06-20 MED ORDER — KETOROLAC TROMETHAMINE 30 MG/ML IJ SOLN
30.0000 mg | Freq: Once | INTRAMUSCULAR | Status: AC | PRN
  Administered 2020-06-20: 30 mg via INTRAVENOUS

## 2020-06-20 MED ORDER — SODIUM CHLORIDE 0.9 % IV SOLN
2.0000 g | INTRAVENOUS | Status: AC
  Administered 2020-06-20: 2 g via INTRAVENOUS
  Filled 2020-06-20: qty 2

## 2020-06-20 MED ORDER — PROPOFOL 10 MG/ML IV BOLUS
INTRAVENOUS | Status: DC | PRN
  Administered 2020-06-20: 200 mg via INTRAVENOUS

## 2020-06-20 MED ORDER — ENSURE SURGERY PO LIQD
237.0000 mL | Freq: Two times a day (BID) | ORAL | Status: DC
  Administered 2020-06-20 – 2020-06-23 (×6): 237 mL via ORAL

## 2020-06-20 MED ORDER — ORAL CARE MOUTH RINSE: 15.0000 mL | Freq: Once | OROMUCOSAL | Status: AC

## 2020-06-20 MED ORDER — ONDANSETRON HCL 4 MG/2ML IJ SOLN
INTRAMUSCULAR | Status: AC
  Filled 2020-06-20: qty 2

## 2020-06-20 MED ORDER — LIDOCAINE 2% (20 MG/ML) 5 ML SYRINGE
INTRAMUSCULAR | Status: DC | PRN
  Administered 2020-06-20: 60 mg via INTRAVENOUS

## 2020-06-20 MED ORDER — INSULIN ASPART 100 UNIT/ML ~~LOC~~ SOLN
0.0000 [IU] | Freq: Three times a day (TID) | SUBCUTANEOUS | Status: DC
  Administered 2020-06-20 – 2020-06-21 (×2): 4 [IU] via SUBCUTANEOUS
  Administered 2020-06-22 (×2): 3 [IU] via SUBCUTANEOUS

## 2020-06-20 MED ORDER — ATORVASTATIN CALCIUM 10 MG PO TABS
10.0000 mg | ORAL_TABLET | Freq: Every day | ORAL | Status: DC
  Administered 2020-06-20 – 2020-06-23 (×4): 10 mg via ORAL
  Filled 2020-06-20 (×4): qty 1

## 2020-06-20 MED ORDER — HYDROMORPHONE HCL 1 MG/ML IJ SOLN
0.5000 mg | INTRAMUSCULAR | Status: DC | PRN
  Administered 2020-06-20 – 2020-06-21 (×4): 0.5 mg via INTRAVENOUS
  Filled 2020-06-20 (×4): qty 0.5

## 2020-06-20 MED ORDER — GABAPENTIN 300 MG PO CAPS
300.0000 mg | ORAL_CAPSULE | ORAL | Status: AC
  Administered 2020-06-20: 300 mg via ORAL
  Filled 2020-06-20: qty 1

## 2020-06-20 MED ORDER — PROPOFOL 10 MG/ML IV BOLUS
INTRAVENOUS | Status: AC
  Filled 2020-06-20: qty 20

## 2020-06-20 MED ORDER — MEPERIDINE HCL 50 MG/ML IJ SOLN: 6.2500 mg | INTRAMUSCULAR | Status: DC | PRN

## 2020-06-20 MED ORDER — ALUM & MAG HYDROXIDE-SIMETH 200-200-20 MG/5ML PO SUSP: 30.0000 mL | Freq: Four times a day (QID) | ORAL | Status: DC | PRN

## 2020-06-20 MED ORDER — LACTATED RINGERS IR SOLN
Status: DC | PRN
  Administered 2020-06-20: 3000 mL

## 2020-06-20 MED ORDER — SODIUM CHLORIDE 0.9 % IV SOLN
2.0000 g | Freq: Two times a day (BID) | INTRAVENOUS | Status: AC
  Administered 2020-06-20: 2 g via INTRAVENOUS
  Filled 2020-06-20: qty 2

## 2020-06-20 MED ORDER — PHENYLEPHRINE 40 MCG/ML (10ML) SYRINGE FOR IV PUSH (FOR BLOOD PRESSURE SUPPORT)
PREFILLED_SYRINGE | INTRAVENOUS | Status: DC | PRN
  Administered 2020-06-20: 120 ug via INTRAVENOUS

## 2020-06-20 MED ORDER — METFORMIN HCL 500 MG PO TABS
1000.0000 mg | ORAL_TABLET | Freq: Two times a day (BID) | ORAL | Status: DC
  Administered 2020-06-22 – 2020-06-23 (×3): 1000 mg via ORAL
  Filled 2020-06-20 (×5): qty 2

## 2020-06-20 MED ORDER — ONDANSETRON HCL 4 MG PO TABS: 4.0000 mg | ORAL_TABLET | Freq: Four times a day (QID) | ORAL | Status: DC | PRN

## 2020-06-20 MED ORDER — DEXAMETHASONE SODIUM PHOSPHATE 10 MG/ML IJ SOLN
INTRAMUSCULAR | Status: AC
  Filled 2020-06-20: qty 1

## 2020-06-20 MED ORDER — FENTANYL CITRATE (PF) 100 MCG/2ML IJ SOLN
INTRAMUSCULAR | Status: DC | PRN
  Administered 2020-06-20: 100 ug via INTRAVENOUS

## 2020-06-20 MED ORDER — BUPIVACAINE-EPINEPHRINE (PF) 0.25% -1:200000 IJ SOLN
INTRAMUSCULAR | Status: AC
  Filled 2020-06-20: qty 30

## 2020-06-20 MED ORDER — OXYCODONE HCL 5 MG/5ML PO SOLN: 5.0000 mg | Freq: Once | ORAL | Status: DC | PRN

## 2020-06-20 MED ORDER — MIDAZOLAM HCL 2 MG/2ML IJ SOLN
INTRAMUSCULAR | Status: DC | PRN
  Administered 2020-06-20: 2 mg via INTRAVENOUS

## 2020-06-20 MED ORDER — 0.9 % SODIUM CHLORIDE (POUR BTL) OPTIME
TOPICAL | Status: DC | PRN
  Administered 2020-06-20 (×2): 1000 mL

## 2020-06-20 MED ORDER — SIMETHICONE 80 MG PO CHEW
40.0000 mg | CHEWABLE_TABLET | Freq: Four times a day (QID) | ORAL | Status: DC | PRN
  Administered 2020-06-20 – 2020-06-21 (×2): 40 mg via ORAL
  Filled 2020-06-20 (×2): qty 1

## 2020-06-20 MED ORDER — BUPIVACAINE LIPOSOME 1.3 % IJ SUSP
INTRAMUSCULAR | Status: DC | PRN
  Administered 2020-06-20: 20 mL

## 2020-06-20 MED ORDER — DEXAMETHASONE SODIUM PHOSPHATE 10 MG/ML IJ SOLN
INTRAMUSCULAR | Status: DC | PRN
  Administered 2020-06-20: 4 mg via INTRAVENOUS

## 2020-06-20 MED ORDER — PHENYLEPHRINE 40 MCG/ML (10ML) SYRINGE FOR IV PUSH (FOR BLOOD PRESSURE SUPPORT)
PREFILLED_SYRINGE | INTRAVENOUS | Status: AC
  Filled 2020-06-20: qty 10

## 2020-06-20 SURGICAL SUPPLY — 62 items
APPLIER CLIP 5 13 M/L LIGAMAX5 (MISCELLANEOUS)
BLADE EXTENDED COATED 6.5IN (ELECTRODE) IMPLANT
CABLE HIGH FREQUENCY MONO STRZ (ELECTRODE) IMPLANT
CELLS DAT CNTRL 66122 CELL SVR (MISCELLANEOUS) IMPLANT
CHLORAPREP W/TINT 26 (MISCELLANEOUS) IMPLANT
CLIP APPLIE 5 13 M/L LIGAMAX5 (MISCELLANEOUS) IMPLANT
COVER WAND RF STERILE (DRAPES) ×2 IMPLANT
DECANTER SPIKE VIAL GLASS SM (MISCELLANEOUS) ×2 IMPLANT
DERMABOND ADVANCED (GAUZE/BANDAGES/DRESSINGS) ×1
DERMABOND ADVANCED .7 DNX12 (GAUZE/BANDAGES/DRESSINGS) ×1 IMPLANT
DRAIN CHANNEL 19F RND (DRAIN) IMPLANT
DRAPE LAPAROSCOPIC ABDOMINAL (DRAPES) ×2 IMPLANT
DRAPE SURG IRRIG POUCH 19X23 (DRAPES) IMPLANT
DRSG OPSITE POSTOP 4X10 (GAUZE/BANDAGES/DRESSINGS) IMPLANT
DRSG OPSITE POSTOP 4X6 (GAUZE/BANDAGES/DRESSINGS) ×2 IMPLANT
DRSG OPSITE POSTOP 4X8 (GAUZE/BANDAGES/DRESSINGS) IMPLANT
ELECT REM PT RETURN 15FT ADLT (MISCELLANEOUS) ×2 IMPLANT
EVACUATOR SILICONE 100CC (DRAIN) IMPLANT
GAUZE SPONGE 4X4 12PLY STRL (GAUZE/BANDAGES/DRESSINGS) IMPLANT
GLOVE SURG ENC MOIS LTX SZ6.5 (GLOVE) ×4 IMPLANT
GLOVE SURG UNDER POLY LF SZ7 (GLOVE) ×4 IMPLANT
GOWN STRL REUS W/TWL XL LVL3 (GOWN DISPOSABLE) ×12 IMPLANT
GRASPER ENDOPATH ANVIL 10MM (MISCELLANEOUS) IMPLANT
HOLDER FOLEY CATH W/STRAP (MISCELLANEOUS) ×2 IMPLANT
IRRIG SUCT STRYKERFLOW 2 WTIP (MISCELLANEOUS) ×2
IRRIGATION SUCT STRKRFLW 2 WTP (MISCELLANEOUS) ×1 IMPLANT
KIT TURNOVER KIT A (KITS) ×2 IMPLANT
PACK COLON (CUSTOM PROCEDURE TRAY) ×2 IMPLANT
PAD POSITIONING PINK XL (MISCELLANEOUS) ×2 IMPLANT
PENCIL SMOKE EVACUATOR (MISCELLANEOUS) IMPLANT
PORT LAP GEL ALEXIS MED 5-9CM (MISCELLANEOUS) ×2 IMPLANT
PROTECTOR NERVE ULNAR (MISCELLANEOUS) ×2 IMPLANT
RELOAD PROXIMATE 75MM BLUE (ENDOMECHANICALS) ×4 IMPLANT
RTRCTR WOUND ALEXIS 18CM MED (MISCELLANEOUS)
SCISSORS LAP 5X35 DISP (ENDOMECHANICALS) ×2 IMPLANT
SEALER TISSUE G2 STRG ARTC 35C (ENDOMECHANICALS) ×2 IMPLANT
SET TUBE SMOKE EVAC HIGH FLOW (TUBING) ×2 IMPLANT
SLEEVE XCEL OPT CAN 5 100 (ENDOMECHANICALS) ×2 IMPLANT
SPONGE DRAIN TRACH 4X4 STRL 2S (GAUZE/BANDAGES/DRESSINGS) IMPLANT
SPONGE LAP 18X18 RF (DISPOSABLE) ×2 IMPLANT
STAPLER GUN LINEAR PROX 60 (STAPLE) ×2 IMPLANT
STAPLER PROXIMATE 75MM BLUE (STAPLE) ×2 IMPLANT
SURGILUBE 2OZ TUBE FLIPTOP (MISCELLANEOUS) IMPLANT
SUT ETHILON 2 0 PS N (SUTURE) IMPLANT
SUT NOVA NAB DX-16 0-1 5-0 T12 (SUTURE) ×4 IMPLANT
SUT PROLENE 2 0 KS (SUTURE) IMPLANT
SUT SILK 2 0 (SUTURE) ×1
SUT SILK 2 0 SH CR/8 (SUTURE) IMPLANT
SUT SILK 2-0 18XBRD TIE 12 (SUTURE) ×1 IMPLANT
SUT SILK 3 0 (SUTURE)
SUT SILK 3 0 SH CR/8 (SUTURE) ×2 IMPLANT
SUT SILK 3-0 18XBRD TIE 12 (SUTURE) IMPLANT
SUT VIC AB 2-0 SH 18 (SUTURE) ×2 IMPLANT
SUT VIC AB 4-0 PS2 27 (SUTURE) ×2 IMPLANT
SUT VICRYL 0 UR6 27IN ABS (SUTURE) ×2 IMPLANT
SYS LAPSCP GELPORT 120MM (MISCELLANEOUS)
SYSTEM LAPSCP GELPORT 120MM (MISCELLANEOUS) IMPLANT
TOWEL OR NON WOVEN STRL DISP B (DISPOSABLE) ×2 IMPLANT
TRAY FOLEY MTR SLVR 16FR STAT (SET/KITS/TRAYS/PACK) ×2 IMPLANT
TROCAR BLADELESS OPT 5 100 (ENDOMECHANICALS) ×2 IMPLANT
TROCAR XCEL BLUNT TIP 100MML (ENDOMECHANICALS) IMPLANT
TUBING CONNECTING 10 (TUBING) ×4 IMPLANT

## 2020-06-20 NOTE — Anesthesia Postprocedure Evaluation (Signed)
Anesthesia Post Note  Patient: Kevin Evans  Procedure(s) Performed: LAPAROSCOPIC RIGHT HEMI COLECTOMY (N/A )     Patient location during evaluation: PACU Anesthesia Type: General Level of consciousness: awake and alert, oriented and patient cooperative Pain management: pain level controlled Vital Signs Assessment: post-procedure vital signs reviewed and stable Respiratory status: spontaneous breathing, nonlabored ventilation and respiratory function stable Cardiovascular status: blood pressure returned to baseline and stable Postop Assessment: no apparent nausea or vomiting Anesthetic complications: no   No complications documented.  Last Vitals:  Vitals:   06/20/20 1000 06/20/20 1015  BP: (!) 172/93 (!) 149/95  Pulse: 78 78  Resp: 13 11  Temp:    SpO2: 100% 100%    Last Pain:  Vitals:   06/20/20 1015  PainSc: [place]

## 2020-06-20 NOTE — H&P (Signed)
The patient is a 50 year old male who presents with a colonic mass. 50 year old male who underwent recent physical exam showing an abdominal wall mass. An ultrasound and subsequently a CT scan was obtained for evaluation. Ultrasound showed a large solid indeterminate mass in the right lower quadrant measuring at least 10 cm. CT scan showed a large tubular cystic lesion in the right paracolic gutter, highly suspicious for an appendiceal mucinous neoplasm. There was no evidence of metastatic disease. He is scheduled to undergo a colonoscopy with Dr. Hilarie Fredrickson on March 1. He denies any abdominal wall pain or changes in bowel habits. He had never noticed this mass before his doctor found it on exam. He denies any weight loss. He has no family history of colon cancer.   Past Surgical History Janeann Forehand, CNA; 05/12/2020 9:21 AM) No pertinent past surgical history  Diagnostic Studies History Janeann Forehand, CNA; 05/12/2020 9:21 AM) Colonoscopy 1-5 years ago  Allergies Janeann Forehand, CNA; 05/12/2020 9:24 AM) Bacitracin *ANTI-INFECTIVE AGENTS - MISC.*  Medication History Janeann Forehand, CNA; 05/12/2020 9:25 AM) Atorvastatin Calcium (10MG  Tablet, Oral) Active. Doxycycline Hyclate (100MG  Tablet, Oral) Active. Jardiance (10MG  Tablet, Oral) Active. OneTouch Delica Plus [id number] Active. metFORMIN HCl (1000MG  Tablet, Oral) Active. OneTouch Verio (In Vitro) Active. OneTouch Verio [employer] (w/Device Kit,) Active. Triamcinolone Acetonide (0.1% Cream, External) Active. Medications Reconciled  Social History Janeann Forehand, CNA; 05/12/2020 9:21 AM) Caffeine use Carbonated beverages, Coffee, Tea. No alcohol use No drug use Tobacco use Never smoker.  Family History Janeann Forehand, CNA; 05/12/2020 9:21 AM) Diabetes Mellitus Brother. Heart Disease Father. Hypertension Brother, Father, Sister.  Other Problems Janeann Forehand, CNA; 05/12/2020 9:21 AM) Back  Pain Diabetes Mellitus Gastroesophageal Reflux Disease Hemorrhoids Hypercholesterolemia     Review of Systems  Skin Present- Rash. Not Present- Change in Wart/Mole, Dryness, Hives, Jaundice, New Lesions, Non-Healing Wounds and Ulcer. HEENT Present- Wears glasses/contact lenses. Not Present- Earache, Hearing Loss, Hoarseness, Nose Bleed, Oral Ulcers, Ringing in the Ears, Seasonal Allergies, Sinus Pain, Sore Throat, Visual Disturbances and Yellow Eyes. Respiratory Not Present- Bloody sputum, Chronic Cough, Difficulty Breathing, Snoring and Wheezing. Breast Not Present- Breast Mass, Breast Pain, Nipple Discharge and Skin Changes. Cardiovascular Not Present- Chest Pain, Difficulty Breathing Lying Down, Leg Cramps, Palpitations, Rapid Heart Rate, Shortness of Breath and Swelling of Extremities. Gastrointestinal Present- Constipation, Excessive gas and Rectal Pain. Not Present- Abdominal Pain, Bloating, Bloody Stool, Change in Bowel Habits, Chronic diarrhea, Difficulty Swallowing, Gets full quickly at meals, Hemorrhoids, Indigestion, Nausea and Vomiting. Male Genitourinary Present- Change in Urinary Stream. Not Present- Blood in Urine, Frequency, Impotence, Nocturia, Painful Urination, Urgency and Urine Leakage. Musculoskeletal Not Present- Back Pain, Joint Pain, Joint Stiffness, Muscle Pain, Muscle Weakness and Swelling of Extremities. Neurological Not Present- Decreased Memory, Fainting, Headaches, Numbness, Seizures, Tingling, Tremor, Trouble walking and Weakness. Psychiatric Not Present- Anxiety, Bipolar, Change in Sleep Pattern, Depression, Fearful and Frequent crying. Endocrine Not Present- Cold Intolerance, Excessive Hunger, Hair Changes, Heat Intolerance, Hot flashes and New Diabetes. Hematology Not Present- Blood Thinners, Easy Bruising, Excessive bleeding, Gland problems, HIV and Persistent Infections.  BP (!) 139/93   Pulse 88   Temp 98.3 F (36.8 C)   Resp 18   Ht 6\' 4"   (1.93 m)   Wt 84.8 kg   SpO2 100%   BMI 22.76 kg/m     Physical Exam Leighton Ruff MD; 4/78/2956 9:53 AM)  General Mental Status-Alert. General Appearance-Cooperative.  Abdomen Palpation/Percussion Abdominal Mass Palpable - Location - Right Lower Quadrant.  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 CT scan shows a fluid-filled, enlarged appendix. He has underwent a colonoscopy, which showed a large extrinsic mass in his cecum.  His CEA was elevated at 26.  We will proceed with laparoscopic Right Colectomy for pathologic evaluation.  We discussed the surgery in detail including risk of bleeding, infection, hernia, damage to adjacent structures, and anastomotic leak. All questions were answered. We discussed the typical time off work, that he will need and healing time after surgery. We will plan on him being in the hospital at least overnight.

## 2020-06-20 NOTE — Transfer of Care (Signed)
Immediate Anesthesia Transfer of Care Note  Patient: Kevin Evans  Procedure(s) Performed: LAPAROSCOPIC RIGHT HEMI COLECTOMY (N/A )  Patient Location: PACU  Anesthesia Type:General  Level of Consciousness: awake, alert  and oriented  Airway & Oxygen Therapy: Patient Spontanous Breathing and Patient connected to face mask oxygen  Post-op Assessment: Report given to RN, Post -op Vital signs reviewed and stable and Patient moving all extremities X 4  Post vital signs: Reviewed and stable  Last Vitals:  Vitals Value Taken Time  BP 149/84 06/20/20 0933  Temp    Pulse 80 06/20/20 0934  Resp 20 06/20/20 0933  SpO2 100 % 06/20/20 0934  Vitals shown include unvalidated device data.  Last Pain:  Vitals:   06/20/20 0605  PainSc: 0-No pain         Complications: No complications documented.

## 2020-06-20 NOTE — Progress Notes (Signed)
Pharmacy Brief Note - Alvimopan (Entereg)  A pre-op dose of alvimopan was NOT ordered therefore per hospital policy post-op order has been discontinued.  If there are questions, please contact the pharmacy at [phone].  Thank you- Netta Cedars, [facility] 06/20/2020 11:36 AM

## 2020-06-20 NOTE — Op Note (Signed)
06/20/2020  9:22 AM  PATIENT:  Kevin Evans  50 y.o. male  Patient Care Team: Wendie Agreste, MD as PCP - General (Family Medicine)  PRE-OPERATIVE DIAGNOSIS:  APPENDICAL TUMOR  POST-OPERATIVE DIAGNOSIS:  APPENDICAL TUMOR  PROCEDURE:  LAPAROSCOPIC RIGHT HEMI COLECTOMY   Surgeon(s): Leighton Ruff, MD Ileana Roup, MD  ASSISTANT: Dr Dema Severin   ANESTHESIA:   local and general  EBL:  Total I/O In: 100 [IV Piggyback:100] Out: 57 [Urine:50; Blood:10]  SPECIMEN:  Source of Specimen:  R colon  DISPOSITION OF SPECIMEN:  PATHOLOGY  COUNTS:  YES  PLAN OF CARE: Admit to inpatient   PATIENT DISPOSITION:  PACU - hemodynamically stable.   INDICATIONS: This is a 50 y.o. male who presented to my office with a right lower quadrant mass, appendiceal dilation seen on CT scan and a extrinsic compression of the cecum on colonoscopy with a CEA level greater than 20. The risk and benefits and alternative treatments were explained to the patient prior to the OR and the patient has elected to proceed with minimally invasive right colectomy.  Consent was signed and placed on chart prior to the OR.   OR FINDINGS: Large, dilated appendix with significant fibrosis to the retroperitoneum.  No involvement of the tumor to the abdominal wall or retroperitoneum was identified.  No hepatic metastases or peritoneal metastases were noted.  DESCRIPTION:  The patient was identified & brought into the operating room. The patient was positioned supine with arms tucked. SCDs were active during the entire case. The patient underwent general anesthesia without any difficulty. A foley catheter was inserted under sterile conditions. The abdomen was prepped and draped in a sterile fashion. A Surgical Timeout confirmed our plan.  I made an incision around the umbilical fold. I dissected down through the subcutaneous tissues using cautery.  The fascia was divided with cautery.  Blunt dissection was used to obtain  peritoneal entry.  An alexis wound protector was placed and the cap was placed over this.  The abdomen was insufflated to ~15 mmHg.  Camera inspection revealed no injury.  I placed additional ports under direct laparoscopic visualization.  I evaluated the entire abdomen laparoscopically.  The liver appeared normal, the large and small bowel were normal as well.  There were no signs of metastatic disease.   I began by identifying the ileocolic artery and vein within the mesentery. Dissection was bluntly carried around these structures. The duodenum was identified and free from the structures. I then separated the structures bluntly and used the Enseal device to transect these separately.  I developed the retroperitoneal plane bluntly.  I then freed the appendix off its attachments to the pelvic wall. I mobilized the terminal ileum.  I took care to avoid injuring any retroperitoneal structures.  After this I began to mobilize laterally down the white line of Toldt and then took down the hepatic flexure using the Enseal device. I mobilized the omentum off of the right transverse colon. The entire colon was then flipped medially and mobilized off of the retroperitoneal structures until I could visualize the lateral edge of the duodenum underneath.  I gently freed the duodenal attachments.  At that point, I desufflated the abdomen and removed the wound protector cap.  The terminal ileum and right colon were then removed from the wound. The terminal ileum was transected using a GIA blue load stapler. The remaining mesentery was divided using the Enseal device. I identified a portion of the transverse colon just distal to  the hepatic flexure. This was transected using another blue load GIA stapler.  An anastomosis was created between the terminal ileum and the transverse colon. This was done using a GIA blue load stapler.  The common enterotomy channel was closed using a TA 60 blue load stapler. Hemostasis was good at  the staple line. Several 3-0 silk sutures were used to imbricate the edge of the anastomosis. An anti-tension suture was placed in the crotch of the anastomosis. This was then placed back into the abdomen. The abdomen was then irrigated with normal saline.  Hemostasis was good.  The omentum was then brought down over the anastomosis. The Alexis wound protector was removed, and we switched to clean instruments, gowns and drapes.  The fascia was then closed using #1 Novafil interrupted sutures.  The subcutaneous tissue of the extraction incision was closed using a running 0 Vicryl suture. The skin was then closed using 4-0 Vicryl sutures. Dermabond was placed on the port sites and a sterile dressing was placed over the abdominal incision. All counts were correct per operating room staff. The patient was then awakened from anesthesia and sent to the post anesthesia care unit in stable condition.

## 2020-06-20 NOTE — Anesthesia Procedure Notes (Signed)
Procedure Name: Intubation Date/Time: 06/20/2020 7:32 AM Performed by: Niel Hummer, CRNA Pre-anesthesia Checklist: Patient identified, Emergency Drugs available, Suction available and Patient being monitored Patient Re-evaluated:Patient Re-evaluated prior to induction Oxygen Delivery Method: Circle system utilized Preoxygenation: Pre-oxygenation with 100% oxygen Induction Type: IV induction Ventilation: Mask ventilation with difficulty and Oral airway inserted - appropriate to patient size Laryngoscope Size: Glidescope and 4 Grade View: Grade I Tube type: Oral Tube size: 7.5 mm Number of attempts: 3 Airway Equipment and Method: Stylet and Video-laryngoscopy Placement Confirmation: ETT inserted through vocal cords under direct vision,  CO2 detector and breath sounds checked- equal and bilateral Secured at: 25 cm Tube secured with: Tape Dental Injury: Teeth and Oropharynx as per pre-operative assessment  Difficulty Due To: Difficult Airway- due to anterior larynx Comments: DL by CRNA MAC 4, grade 2b-3 view. Unsuccessful tube placement. DL by Dr. Margaretmary Dys 2, grade 3 view. Mask ventilation with minimal volumes. Glidescope S4 used, grade 1 view. Successful tube placement.

## 2020-06-20 NOTE — Anesthesia Preprocedure Evaluation (Addendum)
Anesthesia Evaluation  Patient identified by MRN, date of birth, ID band Patient awake    Reviewed: Allergy & Precautions, NPO status , Patient's Chart, lab work & pertinent test results  Airway Mallampati: III  TM Distance: >3 FB Neck ROM: Full    Dental  (+) Teeth Intact, Dental Advisory Given,    Pulmonary neg pulmonary ROS,    Pulmonary exam normal breath sounds clear to auscultation       Cardiovascular hypertension, Normal cardiovascular exam Rhythm:Regular Rate:Normal     Neuro/Psych negative neurological ROS  negative psych ROS   GI/Hepatic Neg liver ROS, GERD  Controlled,Appendiceal tumor   Endo/Other  diabetes, Well Controlled, Type 2, Oral Hypoglycemic Agentsa1c 6.3  Renal/GU negative Renal ROS  negative genitourinary   Musculoskeletal negative musculoskeletal ROS (+)   Abdominal   Peds  Hematology negative hematology ROS (+) hct 46.3, plt 234   Anesthesia Other Findings   Reproductive/Obstetrics negative OB ROS                            Anesthesia Physical Anesthesia Plan  ASA: II  Anesthesia Plan: General   Post-op Pain Management:    Induction: Intravenous  PONV Risk Score and Plan: 3 and Ondansetron, Dexamethasone, Midazolam and Treatment may vary due to age or medical condition  Airway Management Planned: Oral ETT  Additional Equipment: None  Intra-op Plan:   Post-operative Plan: Extubation in OR  Informed Consent: I have reviewed the patients History and Physical, chart, labs and discussed the procedure including the risks, benefits and alternatives for the proposed anesthesia with the patient or authorized representative who has indicated his/her understanding and acceptance.     Dental advisory given  Plan Discussed with: CRNA  Anesthesia Plan Comments:         Anesthesia Quick Evaluation

## 2020-06-21 ENCOUNTER — Encounter (HOSPITAL_COMMUNITY): Payer: Self-pay | Admitting: General Surgery

## 2020-06-21 LAB — CBC
HCT: 42.3 % (ref 39.0–52.0)
Hemoglobin: 13.7 g/dL (ref 13.0–17.0)
MCH: 27 pg (ref 26.0–34.0)
MCHC: 32.4 g/dL (ref 30.0–36.0)
MCV: 83.3 fL (ref 80.0–100.0)
Platelets: 217 10*3/uL (ref 150–400)
RBC: 5.08 MIL/uL (ref 4.22–5.81)
RDW: 12.8 % (ref 11.5–15.5)
WBC: 7.9 10*3/uL (ref 4.0–10.5)
nRBC: 0 % (ref 0.0–0.2)

## 2020-06-21 LAB — BASIC METABOLIC PANEL
Anion gap: 8 (ref 5–15)
BUN: 14 mg/dL (ref 6–20)
CO2: 25 mmol/L (ref 22–32)
Calcium: 8.8 mg/dL — ABNORMAL LOW (ref 8.9–10.3)
Chloride: 102 mmol/L (ref 98–111)
Creatinine, Ser: 0.94 mg/dL (ref 0.61–1.24)
GFR, Estimated: >60 mL/min (ref >60)
Glucose, Bld: 152 mg/dL — ABNORMAL HIGH (ref 70–99)
Potassium: 4.1 mmol/L (ref 3.5–5.1)
Sodium: 135 mmol/L (ref 135–145)

## 2020-06-21 LAB — GLUCOSE, CAPILLARY
Glucose-Capillary: 105 mg/dL — ABNORMAL HIGH (ref 70–99)
Glucose-Capillary: 159 mg/dL — ABNORMAL HIGH (ref 70–99)
Glucose-Capillary: 83 mg/dL (ref 70–99)
Glucose-Capillary: 127 mg/dL — ABNORMAL HIGH (ref 70–99)

## 2020-06-21 MED ORDER — HYDROMORPHONE HCL 1 MG/ML IJ SOLN: 0.5000 mg | INTRAMUSCULAR | Status: DC | PRN

## 2020-06-21 MED ORDER — TRAMADOL HCL 50 MG PO TABS
50.0000 mg | ORAL_TABLET | Freq: Four times a day (QID) | ORAL | Status: DC | PRN
  Administered 2020-06-21: 50 mg via ORAL
  Filled 2020-06-21: qty 1

## 2020-06-21 MED ORDER — HYDROCODONE-ACETAMINOPHEN 5-325 MG PO TABS
1.0000 | ORAL_TABLET | ORAL | Status: DC | PRN
  Administered 2020-06-21 (×2): 1 via ORAL
  Filled 2020-06-21 (×2): qty 1

## 2020-06-21 NOTE — Progress Notes (Signed)
1 Day Post-Op Lap R colectomy Subjective: Pain well controlled, no flatus but had a BM.  Feels bloated but no nausea  Objective: Vital signs in last 24 hours: Temp:  [97.4 F (36.3 C)-98.6 F (37 C)] 98.1 F (36.7 C) (03/26 0929) Pulse Rate:  [58-75] 58 (03/26 0929) Resp:  [12-18] 18 (03/26 0929) BP: (124-153)/(72-95) 137/75 (03/26 0929) SpO2:  [100 %] 100 % (03/26 0929)   Intake/Output from previous day: 03/25 0701 - 03/26 0700 In: 4057.4 [P.O.:1680; I.V.:2277.4; IV Piggyback:100] Out: 2660 [Urine:2650; Blood:10] Intake/Output this shift: Total I/O In: 240 [P.O.:240] Out: 252 [Urine:251; Stool:1]   General appearance: alert and cooperative GI: soft, mild distention  Incision: no significant drainage  Lab Results:  Recent Labs    06/21/20 0552  WBC 7.9  HGB 13.7  HCT 42.3  PLT 217   BMET Recent Labs    06/21/20 0552  NA 135  K 4.1  CL 102  CO2 25  GLUCOSE 152*  BUN 14  CREATININE 0.94  CALCIUM 8.8*   PT/INR No results for input(s): LABPROT, INR in the last 72 hours. ABG No results for input(s): PHART, HCO3 in the last 72 hours.  Invalid input(s): PCO2, PO2  MEDS, Scheduled . acetaminophen  1,000 mg Oral Q6H  . atorvastatin  10 mg Oral Daily  . enoxaparin (LOVENOX) injection  40 mg Subcutaneous Q24H  . feeding supplement  237 mL Oral BID BM  . gabapentin  300 mg Oral BID  . insulin aspart  0-20 Units Subcutaneous TID WC  . insulin aspart  0-5 Units Subcutaneous QHS  . [START ON 06/22/2020] metFORMIN  1,000 mg Oral BID WC  . saccharomyces boulardii  250 mg Oral BID    Studies/Results: No results found.  Assessment: s/p Procedure(s): LAPAROSCOPIC RIGHT HEMI COLECTOMY Patient Active Problem List   Diagnosis Date Noted  . 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 Hyperlipidemia 03/09/2017  . Diabetes ([place]) 04/08/2015    Expected post op course  Plan: Advance diet as tolerated Ambulate in hall Decrease IVF's D/c foley   LOS: 1 day      .[name], MD [facility], [place]    06/21/2020 10:17 AM

## 2020-06-22 LAB — GLUCOSE, CAPILLARY
Glucose-Capillary: 123 mg/dL — ABNORMAL HIGH (ref 70–99)
Glucose-Capillary: 93 mg/dL (ref 70–99)
Glucose-Capillary: 145 mg/dL — ABNORMAL HIGH (ref 70–99)
Glucose-Capillary: 130 mg/dL — ABNORMAL HIGH (ref 70–99)

## 2020-06-22 LAB — BASIC METABOLIC PANEL
Anion gap: 6 (ref 5–15)
BUN: 12 mg/dL (ref 6–20)
CO2: 28 mmol/L (ref 22–32)
Calcium: 8.9 mg/dL (ref 8.9–10.3)
Chloride: 103 mmol/L (ref 98–111)
Creatinine, Ser: 0.89 mg/dL (ref 0.61–1.24)
GFR, Estimated: >60 mL/min (ref >60)
Glucose, Bld: 97 mg/dL (ref 70–99)
Potassium: 4 mmol/L (ref 3.5–5.1)
Sodium: 137 mmol/L (ref 135–145)

## 2020-06-22 LAB — CBC
HCT: 41.4 % (ref 39.0–52.0)
Hemoglobin: 13.5 g/dL (ref 13.0–17.0)
MCH: 27.1 pg (ref 26.0–34.0)
MCHC: 32.6 g/dL (ref 30.0–36.0)
MCV: 83.1 fL (ref 80.0–100.0)
Platelets: 204 10*3/uL (ref 150–400)
RBC: 4.98 MIL/uL (ref 4.22–5.81)
RDW: 12.9 % (ref 11.5–15.5)
WBC: 5.1 10*3/uL (ref 4.0–10.5)
nRBC: 0 % (ref 0.0–0.2)

## 2020-06-22 NOTE — Progress Notes (Signed)
2 Days Post-Op Lap R colectomy Subjective: Pain well controlled, having flatus and BMs.  Feels bloated but resolving  Objective: Vital signs in last 24 hours: Temp:  [97.9 F (36.6 C)-98.5 F (36.9 C)] 98.5 F (36.9 C) (03/27 0607) Pulse Rate:  [58-76] 76 (03/27 0607) Resp:  [14-18] 18 (03/27 0607) BP: (133-143)/(75-93) 140/87 (03/27 0607) SpO2:  [98 %-100 %] 99 % (03/27 0607)   Intake/Output from previous day: 03/26 0701 - 03/27 0700 In: 960 [P.O.:960] Out: 257 [Urine:256; Stool:1] Intake/Output this shift: No intake/output data recorded.   General appearance: alert and cooperative GI: soft, mild distention  Incision: no significant drainage  Lab Results:  Recent Labs    06/21/20 0552 06/22/20 0438  WBC 7.9 5.1  HGB 13.7 13.5  HCT 42.3 41.4  PLT 217 204   BMET Recent Labs    06/21/20 0552 06/22/20 0438  NA 135 137  K 4.1 4.0  CL 102 103  CO2 25 28  GLUCOSE 152* 97  BUN 14 12  CREATININE 0.94 0.89  CALCIUM 8.8* 8.9   PT/INR No results for input(s): LABPROT, INR in the last 72 hours. ABG No results for input(s): PHART, HCO3 in the last 72 hours.  Invalid input(s): PCO2, PO2  MEDS, Scheduled . acetaminophen  1,000 mg Oral Q6H  . atorvastatin  10 mg Oral Daily  . enoxaparin (LOVENOX) injection  40 mg Subcutaneous Q24H  . feeding supplement  237 mL Oral BID BM  . gabapentin  300 mg Oral BID  . insulin aspart  0-20 Units Subcutaneous TID WC  . insulin aspart  0-5 Units Subcutaneous QHS  . metFORMIN  1,000 mg Oral BID WC  . saccharomyces boulardii  250 mg Oral BID    Studies/Results: No results found.  Assessment: s/p Procedure(s): LAPAROSCOPIC RIGHT HEMI COLECTOMY Patient Active Problem List   Diagnosis Date Noted  . 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 Hyperlipidemia 03/09/2017  . Diabetes ([place]) 04/08/2015    Expected post op course  Plan: Advance diet to soft foods Ambulate in hall SL IVF's    LOS: 2 days     .[name],  MD [facility], [place]    06/22/2020 8:35 AM

## 2020-06-23 LAB — GLUCOSE, CAPILLARY
Glucose-Capillary: 109 mg/dL — ABNORMAL HIGH (ref 70–99)
Glucose-Capillary: 125 mg/dL — ABNORMAL HIGH (ref 70–99)

## 2020-06-23 MED ORDER — TRAMADOL HCL 50 MG PO TABS: 50.0000 mg | ORAL_TABLET | Freq: Four times a day (QID) | ORAL | 0 refills | Status: DC | PRN

## 2020-06-23 NOTE — Discharge Summary (Signed)
Physician Discharge Summary  Patient ID: Kevin Evans MRN: [id number] DOB/AGE: [birth year] 50 y.o.  Admit date: 06/20/2020 Discharge date: 06/23/2020  Admission Diagnoses: Appendiceal tumor  Discharge Diagnoses:  Active Problems:   Appendiceal tumor   Discharged Condition: good  Hospital Course: 50 year old male who presents to the hospital with a right lower quadrant mass.  He underwent laparoscopic-assisted right colectomy.  He was admitted to the floor afterwards.  His diet was advanced as tolerated.  His Foley was removed on postop day 1.  By postop day 3 he was ambulating well and having good bowel function.  He was tolerating a diet without difficulty.  Consults: None  Significant Diagnostic Studies: labs: cbc, bmet  Treatments: IV hydration, analgesia: acetaminophen and surgery: see above  Discharge Exam: Blood pressure (!) 109/95, pulse 84, temperature 97.7 F (36.5 C), temperature source Oral, resp. rate 18, height _0  (1.93 m), weight 84.8 kg, SpO2 100 %. General appearance: alert and cooperative GI: normal findings: soft, non-tender Incision/Wound: clean, dry, intact  Disposition: Discharge disposition: 01-Home or Self Care        Allergies as of 06/23/2020      Reactions   Other Itching, Swelling   Bactrim [sulfamethoxazole-trimethoprim] Itching   Itching all over body    Shrimp (diagnostic)    Under cooked shrimp, facial swelling      Medication List    TAKE these medications   atorvastatin 10 MG tablet Commonly known as: LIPITOR Take 1 tablet (10 mg total) by mouth daily.   blood glucose meter kit and supplies Dispense based on patient and insurance preference.check up to once per day.   Blood Glucose Monitoring Suppl w/Device Kit Monitor glucose twice daily   diphenhydrAMINE 25 MG tablet Commonly known as: BENADRYL Take 12.5-25 mg by mouth at bedtime as needed for sleep.   empagliflozin 10 MG Tabs tablet Commonly known as: Jardiance Take 1  tablet (10 mg total) by mouth daily.   ibuprofen 200 MG tablet Commonly known as: ADVIL Take 400 mg by mouth daily as needed for headache.   metFORMIN 1000 MG tablet Commonly known as: GLUCOPHAGE TAKE 1 TABLET(1000 MG) BY MOUTH TWICE DAILY WITH A MEAL What changed:   how much to take  how to take this  when to take this   traMADol 50 MG tablet Commonly known as: ULTRAM Take 1-2 tablets (50-100 mg total) by mouth every 6 (six) hours as needed for moderate pain or severe pain.   triamcinolone 0.1 % Commonly known as: KENALOG Apply 1 application topically 2 (two) times daily.       Follow-up Information    Leighton Ruff, MD. Schedule an appointment as soon as possible for a visit in 2 week(s).   Specialties: General Surgery, Colon and Rectal Surgery Contact information: [address] [place] [address] [phone]               Signed: [name] 4/66/5993, 7:41 AM

## 2020-06-23 NOTE — Discharge Instructions (Signed)

## 2020-06-23 NOTE — Progress Notes (Signed)
Patient was given discharge instructions, and all questions were answered. Patient was stable for discharge and was walked to the main exit. 

## 2020-06-25 LAB — SURGICAL PATHOLOGY

## 2020-06-29 ENCOUNTER — Other Ambulatory Visit: Payer: Self-pay

## 2020-06-29 ENCOUNTER — Emergency Department (HOSPITAL_COMMUNITY)
Admission: EM | Admit: 2020-06-29 | Discharge: 2020-06-29 | Disposition: A | Discharge status: Home / Self Care | Payer: 59 | Attending: Emergency Medicine | Admitting: Emergency Medicine

## 2020-06-29 ENCOUNTER — Encounter (HOSPITAL_COMMUNITY): Payer: Self-pay | Admitting: Emergency Medicine

## 2020-06-29 DIAGNOSIS — R300 Dysuria: Secondary | ICD-10-CM | POA: Diagnosis present

## 2020-06-29 DIAGNOSIS — Z7984 Long term (current) use of oral hypoglycemic drugs: Secondary | ICD-10-CM | POA: Insufficient documentation

## 2020-06-29 DIAGNOSIS — I10 Essential (primary) hypertension: Secondary | ICD-10-CM | POA: Diagnosis not present

## 2020-06-29 DIAGNOSIS — E119 Type 2 diabetes mellitus without complications: Secondary | ICD-10-CM | POA: Diagnosis not present

## 2020-06-29 DIAGNOSIS — N3000 Acute cystitis without hematuria: Secondary | ICD-10-CM | POA: Diagnosis not present

## 2020-06-29 LAB — URINALYSIS, ROUTINE W REFLEX MICROSCOPIC
Bacteria, UA: NONE SEEN
Bilirubin Urine: NEGATIVE
Glucose, UA: >=500 mg/dL — AB
Ketones, ur: NEGATIVE mg/dL
Leukocytes,Ua: NEGATIVE
Nitrite: POSITIVE — AB
Protein, ur: 100 mg/dL — AB
RBC / HPF: >50 RBC/hpf — ABNORMAL HIGH (ref 0–5)
Specific Gravity, Urine: 1.021 (ref 1.005–1.030)
pH: 5 (ref 5.0–8.0)

## 2020-06-29 MED ORDER — CEPHALEXIN 500 MG PO CAPS
500.0000 mg | ORAL_CAPSULE | Freq: Once | ORAL | Status: DC
  Filled 2020-06-29: qty 1

## 2020-06-29 MED ORDER — CEPHALEXIN 500 MG PO CAPS: 500.0000 mg | ORAL_CAPSULE | Freq: Two times a day (BID) | ORAL | 0 refills | Status: DC

## 2020-06-29 NOTE — ED Triage Notes (Signed)
Pt reports that he was diagnosed with a UTI on Friday. Reports symptoms started on Wednesday. Reports frequency, hesitancy, and burning with urination. He has been on Cipro since Friday morning.

## 2020-06-29 NOTE — ED Provider Notes (Signed)
[facility] DEPT Provider Note   CSN: [id number] Arrival date & time: 06/29/20  0030     History Chief Complaint  Patient presents with  . Urinary Frequency    Kevin Evans is a 50 y.o. male.  Patient presents to the emergency department with a chief complaint of dysuria.  He states that he was diagnosed a couple of days ago with a UTI.  Denies any improvement of his symptoms after having been on Cipro for a few days.  He was recently in the hospital for a colectomy and did have a Foley catheter during surgery.  He denies having any fevers.  He does report urinary frequency, hesitancy, and burning.  The history is provided by the patient. No language interpreter was used.       Past Medical History:  Diagnosis Date  . Diabetes mellitus without complication ([place])    type 2  . GERD (gastroesophageal reflux disease)    hx of  . HLD (hyperlipidemia)   . Hyperlipidemia    Phreesia 06/10/2020  . Hypertension    No meds pt. denies at preop    Patient Active Problem List   Diagnosis Date Noted  . 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 Hyperlipidemia 03/09/2017  . Diabetes ([place]) 04/08/2015    Past Surgical History:  Procedure Laterality Date  . COLONOSCOPY    . LAPAROSCOPIC RIGHT HEMI COLECTOMY N/A 06/20/2020   Procedure: LAPAROSCOPIC RIGHT HEMI COLECTOMY;  Surgeon: Leighton Ruff, MD;  Location: [facility] ORS;  Service: General;  Laterality: N/A;  . NO PAST SURGERIES    . UPPER GASTROINTESTINAL ENDOSCOPY         Family History  Problem Relation Age of Onset  . Hypertension Mother   . Stroke Mother   . Diabetes Paternal Aunt   . Diabetes Paternal Uncle   . Breast cancer Paternal Aunt   . Colon cancer Neg Hx   . Colon polyps Neg Hx   . Esophageal cancer Neg Hx   . Rectal cancer Neg Hx   . Stomach cancer Neg Hx     Social History   Tobacco Use  . Smoking status: Never Smoker  . Smokeless tobacco: Never Used  Vaping Use  . Vaping Use: Never  used  Substance Use Topics  . Alcohol use: No    Alcohol/week: 0.0 standard drinks  . Drug use: No    Home Medications Prior to Admission medications   Medication Sig Start Date End Date Taking? Authorizing Provider  cephALEXin (KEFLEX) 500 MG capsule Take 1 capsule (500 mg total) by mouth 2 (two) times daily. 06/29/20  Yes Montine Circle, PA-C  atorvastatin (LIPITOR) 10 MG tablet Take 1 tablet (10 mg total) by mouth daily. 03/31/20   Wendie Agreste, MD  blood glucose meter kit and supplies Dispense based on patient and insurance preference.check up to once per day. 01/01/20   Horald Pollen, MD  Blood Glucose Monitoring Suppl w/Device KIT Monitor glucose twice daily 07/04/15   Ezekiel Slocumb, PA-C  diphenhydrAMINE (BENADRYL) 25 MG tablet Take 12.5-25 mg by mouth at bedtime as needed for sleep.    [provider]  empagliflozin (JARDIANCE) 10 MG TABS tablet Take 1 tablet (10 mg total) by mouth daily. 03/31/20   Wendie Agreste, MD  ibuprofen (ADVIL) 200 MG tablet Take 400 mg by mouth daily as needed for headache.    [provider]  metFORMIN (GLUCOPHAGE) 1000 MG tablet TAKE 1 TABLET(1000 MG) BY MOUTH TWICE DAILY  WITH A MEAL Patient taking differently: Take 1,000 mg by mouth 2 (two) times daily with a meal. TAKE 1 TABLET(1000 MG) BY MOUTH TWICE DAILY WITH A MEAL 03/31/20   Wendie Agreste, MD  traMADol (ULTRAM) 50 MG tablet Take 1-2 tablets (50-100 mg total) by mouth every 6 (six) hours as needed for moderate pain or severe pain. 9/41/74   Leighton Ruff, MD  triamcinolone (KENALOG) 0.1 % Apply 1 application topically 2 (two) times daily. 06/11/20   Wendie Agreste, MD    Allergies    Other, Bactrim [sulfamethoxazole-trimethoprim], and Shrimp (diagnostic)  Review of Systems   Review of Systems  All other systems reviewed and are negative.   Physical Exam Updated Vital Signs BP (!) 156/100 (BP Location: Left Arm)   Pulse 78   Temp 98.1 F (36.7 C) (Oral)    Resp 16   SpO2 99%   Physical Exam Vitals and nursing note reviewed.  Constitutional:      General: He is not in acute distress.    Appearance: He is well-developed. He is not ill-appearing.  HENT:     Head: Normocephalic and atraumatic.  Eyes:     Conjunctiva/sclera: Conjunctivae normal.  Cardiovascular:     Rate and Rhythm: Normal rate.  Pulmonary:     Effort: Pulmonary effort is normal. No respiratory distress.  Abdominal:     General: There is no distension.     Comments: Abdominal incision are clean dry and intact  Genitourinary:    Comments: No penile discharge Musculoskeletal:     Cervical back: Neck supple.     Comments: Moves all extremities  Skin:    General: Skin is warm and dry.  Neurological:     Mental Status: He is alert and oriented to person, place, and time.  Psychiatric:        Mood and Affect: Mood normal.        Behavior: Behavior normal.     ED Results / Procedures / Treatments   Labs (all labs ordered are listed, but only abnormal results are displayed) Labs Reviewed  URINE CULTURE  URINALYSIS, ROUTINE W REFLEX MICROSCOPIC    EKG None  Radiology No results found.  Procedures Procedures   Medications Ordered in ED Medications  cephALEXin (KEFLEX) capsule 500 mg (has no administration in time range)    ED Course  I have reviewed the triage vital signs and the nursing notes.  Pertinent labs & imaging results that were available during my care of the patient were reviewed by me and considered in my medical decision making (see chart for details).    MDM Rules/Calculators/A&P                          Patient here with dysuria.  Has been on Cipro for a few days without any improvement.  Will discontinue this, switch to Keflex, and check urine culture.  Patient had Foley catheter during surgery a little over a week ago. Final Clinical Impression(s) / ED Diagnoses Final diagnoses:  Acute cystitis without hematuria    Rx / DC  Orders ED Discharge Orders         Ordered    cephALEXin (KEFLEX) 500 MG capsule  2 times daily        06/29/20 0104           Montine Circle, PA-C 06/29/20 0106    Deno Etienne, DO 06/29/20 0814

## 2020-06-30 LAB — URINE CULTURE: Culture: NO GROWTH

## 2020-07-02 ENCOUNTER — Other Ambulatory Visit: Payer: Self-pay

## 2020-07-02 NOTE — Progress Notes (Signed)
The proposed treatment discussed in conference is for discussion purposes only and is not a binding recommendation.  The patients have not been physically examined, or presented with their treatment options.  Therefore, final treatment plans cannot be decided.   

## 2020-07-14 ENCOUNTER — Ambulatory Visit: Payer: Self-pay | Admitting: Family Medicine

## 2020-07-30 ENCOUNTER — Ambulatory Visit: Payer: 59 | Admitting: Family Medicine

## 2020-08-11 ENCOUNTER — Ambulatory Visit: Payer: 59 | Admitting: Family Medicine

## 2020-08-20 ENCOUNTER — Encounter: Payer: Self-pay | Admitting: Family Medicine

## 2020-08-20 ENCOUNTER — Other Ambulatory Visit: Payer: Self-pay

## 2020-08-20 ENCOUNTER — Ambulatory Visit (INDEPENDENT_AMBULATORY_CARE_PROVIDER_SITE_OTHER): Payer: 59 | Admitting: Family Medicine

## 2020-08-20 VITALS — BP 128/76 | HR 88 | Temp 98.0°F | Resp 17 | Ht 76.0 in | Wt 192.2 lb

## 2020-08-20 DIAGNOSIS — R350 Frequency of micturition: Secondary | ICD-10-CM

## 2020-08-20 DIAGNOSIS — E119 Type 2 diabetes mellitus without complications: Secondary | ICD-10-CM | POA: Diagnosis not present

## 2020-08-20 DIAGNOSIS — K649 Unspecified hemorrhoids: Secondary | ICD-10-CM

## 2020-08-20 DIAGNOSIS — L905 Scar conditions and fibrosis of skin: Secondary | ICD-10-CM

## 2020-08-20 DIAGNOSIS — Z298 Encounter for other specified prophylactic measures: Secondary | ICD-10-CM

## 2020-08-20 DIAGNOSIS — R351 Nocturia: Secondary | ICD-10-CM | POA: Diagnosis not present

## 2020-08-20 DIAGNOSIS — E785 Hyperlipidemia, unspecified: Secondary | ICD-10-CM

## 2020-08-20 DIAGNOSIS — N400 Enlarged prostate without lower urinary tract symptoms: Secondary | ICD-10-CM

## 2020-08-20 DIAGNOSIS — E1169 Type 2 diabetes mellitus with other specified complication: Secondary | ICD-10-CM

## 2020-08-20 LAB — POCT URINALYSIS DIP (MANUAL ENTRY)
Bilirubin, UA: NEGATIVE
Glucose, UA: 500 mg/dL — AB
Leukocytes, UA: NEGATIVE
Nitrite, UA: NEGATIVE
Protein Ur, POC: NEGATIVE mg/dL
Spec Grav, UA: 1.025 (ref 1.010–1.025)
Urobilinogen, UA: 0.2 E.U./dL
pH, UA: 5 (ref 5.0–8.0)

## 2020-08-20 MED ORDER — EMPAGLIFLOZIN 10 MG PO TABS: 10.0000 mg | ORAL_TABLET | Freq: Every day | ORAL | 2 refills | Status: DC

## 2020-08-20 MED ORDER — DOXYCYCLINE HYCLATE 100 MG PO TABS: 100.0000 mg | ORAL_TABLET | Freq: Every day | ORAL | 0 refills | Status: DC

## 2020-08-20 MED ORDER — ATORVASTATIN CALCIUM 10 MG PO TABS: 10.0000 mg | ORAL_TABLET | Freq: Every day | ORAL | 2 refills | Status: DC

## 2020-08-20 MED ORDER — DOXAZOSIN MESYLATE 1 MG PO TABS
1.0000 mg | ORAL_TABLET | Freq: Every day | ORAL | 1 refills | Status: DC
Start: 2020-08-20 — End: 2021-04-08

## 2020-08-20 MED ORDER — LIDOCAINE (ANORECTAL) 5 % EX GEL: CUTANEOUS | 0 refills | Status: DC

## 2020-08-20 MED ORDER — METFORMIN HCL 1000 MG PO TABS: ORAL_TABLET | ORAL | 2 refills | Status: DC

## 2020-08-20 NOTE — Progress Notes (Signed)
Subjective:  Patient ID: Kevin Evans, male    DOB: 1970-05-08  Age: 50 y.o. MRN: [id number]  CC:  Chief Complaint  Patient presents with  . Follow-up    Pt reports doing okay with metformin, needs to have a few refills today, denies physical symptoms of HTN and DM no concerns regarding these.   Marland Kitchen Referral    Pt needs referral to urology was told has enlarged prostate and notes he has had increased urinary frequency.   . Travel Consult    Pt also traveling to [place] and has requested refill malaria medication as he usually gets before his trips     HPI Kevin Evans presents for   Diabetes: With hyperglycemia.  Treated with metformin and Jardiance. No new side effects with meds.  Microalbumin: normal ratio in April 2021 Optho, foot exam, pneumovax: up to date. Home readings: Fasting: 104 - under 125.  No sympomatic lows.  On statin.   Lab Results  Component Value Date   HGBA1C 6.3 (H) 06/09/2020   HGBA1C 6.4 (H) 03/31/2020   HGBA1C 8.6 (A) 12/31/2019   Lab Results  Component Value Date   MICROALBUR 4.3 04/03/2015   LDLCALC 113 (H) 12/31/2019   CREATININE 0.89 06/22/2020   Hyperlipidemia: lipitor 10mg  qd.  Lab Results  Component Value Date   CHOL 166 12/31/2019   HDL 38 (L) 12/31/2019   LDLCALC 113 (H) 12/31/2019   TRIG 81 12/31/2019   CHOLHDL 4.4 12/31/2019   Lab Results  Component Value Date   ALT 16 12/31/2019   AST 15 12/31/2019   ALKPHOS 79 12/31/2019   BILITOT 0.2 12/31/2019    Malaria prophylaxis Will be traveling to [place]/01/2020 next week until August 10th (2.9months). 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  has been treated with doxycycline previously and tolerated.   Prostatomegaly Moderately enlarged prostate noted on 04/21/2020 CT abdomen/pelvis. Evaluated April 3 at [facility], ER.  Dysuria.  Had been treated with Cipro previously for cystitis.  Prior colectomy for appendiceal tumor and Foley catheter during surgery.  Changed to Keflex,Urine  culture no growth. Frequent urination off and on for months No recent change in symptoms. No dysuria since antibiotic in April. No fever or hematuria. Some incomplete emptying with urination - few months.  Nocturia - 1-3 times per night. Since surgery.  No results found for: PSA1, PSA  Had abdominal surgery  06/20/20. Some itching at wound, past 2 weeks no drainage/redness.  Tx: otc lotion.   At end of visit asking about hemorrhoids as well. Past few years - come and go.  Constipation at times.  duclolax in past week. - 3 per day.  miralax in past- last used 3 weeks ago.  otc hemorrhoid cream at times.  BM this morning - soft stool.soft yesterday. Better today. Noticed it pushing out yesterday not today.    History Patient Active Problem List   Diagnosis Date Noted  . 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 Hyperlipidemia 03/09/2017  . Diabetes (HCC) 04/08/2015   Past Medical History:  Diagnosis Date  . Diabetes mellitus without complication (HCC)    type 2  . GERD (gastroesophageal reflux disease)    hx of  . HLD (hyperlipidemia)   . Hyperlipidemia    Phreesia 06/10/2020  . Hypertension    No meds pt. denies at preop   Past Surgical History:  Procedure Laterality Date  . APPENDECTOMY  06/20/2020  . COLONOSCOPY    . LAPAROSCOPIC RIGHT HEMI COLECTOMY N/A 06/20/2020  Procedure: LAPAROSCOPIC RIGHT HEMI COLECTOMY;  Surgeon: Leighton Ruff, MD;  Location: [facility] ORS;  Service: General;  Laterality: N/A;  . NO PAST SURGERIES    . UPPER GASTROINTESTINAL ENDOSCOPY     Allergies  Allergen Reactions  . Other Itching and Swelling  . Bactrim [Sulfamethoxazole-Trimethoprim] Itching    Itching all over body   . Shrimp (Diagnostic)     Under cooked shrimp, facial swelling   Prior to Admission medications   Medication Sig Start Date End Date Taking? Authorizing Provider  atorvastatin (LIPITOR) 10 MG tablet Take 1 tablet (10 mg total) by mouth daily. 03/31/20  Yes Wendie Agreste, MD  blood  glucose meter kit and supplies Dispense based on patient and insurance preference.check up to once per day. 01/01/20  Yes Horald Pollen, MD  Blood Glucose Monitoring Suppl w/Device KIT Monitor glucose twice daily 07/04/15  Yes Bush, Benjaman Pott, PA-C  cephALEXin (KEFLEX) 500 MG capsule Take 1 capsule (500 mg total) by mouth 2 (two) times daily. 06/29/20  Yes Montine Circle, PA-C  empagliflozin (JARDIANCE) 10 MG TABS tablet Take 1 tablet (10 mg total) by mouth daily. 03/31/20  Yes Wendie Agreste, MD  metFORMIN (GLUCOPHAGE) 1000 MG tablet TAKE 1 TABLET(1000 MG) BY MOUTH TWICE DAILY WITH A MEAL Patient taking differently: Take 1,000 mg by mouth 2 (two) times daily with a meal. TAKE 1 TABLET(1000 MG) BY MOUTH TWICE DAILY WITH A MEAL 03/31/20  Yes Wendie Agreste, MD  traMADol (ULTRAM) 50 MG tablet Take 1-2 tablets (50-100 mg total) by mouth every 6 (six) hours as needed for moderate pain or severe pain. 4/40/10  Yes Leighton Ruff, MD  triamcinolone (KENALOG) 0.1 % Apply 1 application topically 2 (two) times daily. 06/11/20  Yes Wendie Agreste, MD   Social History   Socioeconomic History  . Marital status: Single    Spouse name: Not on file  . Number of children: 0  . Years of education: Not on file  . Highest education level: Not on file  Occupational History  . Occupation: truck Geophysicist/field seismologist  Tobacco Use  . Smoking status: Never Smoker  . Smokeless tobacco: Never Used  Vaping Use  . Vaping Use: Never used  Substance and Sexual Activity  . Alcohol use: No    Alcohol/week: 0.0 standard drinks  . Drug use: No  . Sexual activity: Yes    Birth control/protection: Condom  Other Topics Concern  . Not on file  Social History Narrative  . Not on file   Social Determinants of Health   Financial Resource Strain: Not on file  Food Insecurity: Not on file  Transportation Needs: Not on file  Physical Activity: Not on file  Stress: Not on file  Social Connections: Not on file  Intimate  Partner Violence: Not on file    Review of Systems Per HPI.   Objective:   Vitals:   08/20/20 1144  BP: 128/76  Pulse: 88  Resp: 17  Temp: 98 F (36.7 C)  TempSrc: Temporal  SpO2: 97%  Weight: 192 lb 3.2 oz (87.2 kg)  Height: $Remove'6\' 4"'pxYIqKf$  (1.93 m)     Physical Exam Vitals reviewed.  Constitutional:      Appearance: He is well-developed.  HENT:     Head: Normocephalic and atraumatic.  Eyes:     Pupils: Pupils are equal, round, and reactive to light.  Neck:     Vascular: No carotid bruit or JVD.  Cardiovascular:     Rate and Rhythm:  Normal rate and regular rhythm.     Heart sounds: Normal heart sounds. No murmur heard.   Pulmonary:     Effort: Pulmonary effort is normal.     Breath sounds: Normal breath sounds. No rales.  Abdominal:     Comments: Healed scar mid abdomen without erythema, d/c.   Genitourinary:    Testes:        Right: Tenderness not present.        Left: Tenderness not present.     Epididymis:     Right: Normal. No tenderness.     Left: Normal. No tenderness.     Comments: Discomfort at anus on DRE but no external hemorrhoids noted.  No apparent fissures or bleeding.  Prostate was enlarged with firm area right greater than left, nontender. Skin:    General: Skin is warm and dry.  Neurological:     Mental Status: He is alert and oriented to person, place, and time.  Psychiatric:        Behavior: Behavior normal.      50 minutes spent during visit, greater than 50% counseling and assimilation of information, chart review, and discussion of plan.    Assessment & Plan:  Kevin Evans is a 50 y.o. male . Controlled type 2 diabetes mellitus without complication, without long-term current use of insulin ([place]) - Plan: metFORMIN (GLUCOPHAGE) 1000 MG tablet, Microalbumin / creatinine urine ratio, Comprehensive metabolic panel Type 2 diabetes mellitus without complication, without long-term current use of insulin (HCC) - Plan: empagliflozin (JARDIANCE) 10 MG  TABS tablet  - check labs, continue metformin same dose for now.   Hyperlipidemia, unspecified hyperlipidemia type - Plan: atorvastatin (LIPITOR) 10 MG tablet, Comprehensive metabolic panel, Lipid panel  - tolerating lipitor, continue same dose, and check labs.    Need for malaria prophylaxis - Plan: doxycycline (VIBRA-TABS) 100 MG tablet  - start 1-2 days prior and continue 4 weeks upon return. Side effects have been discussed.  Scar irritation  -No signs of infection.  Okay to continue lotion, temporary hydrocortisone cream if needed for itching but follow-up with surgeon if continued issues with scar. RTC/ER precautions if worsening.  Urinary frequency - Plan: PSA, Urine Microscopic, POCT urinalysis dipstick, doxazosin (CARDURA) 1 MG tablet, Ambulatory referral to Urology Nocturia - Plan: PSA, Urine Microscopic, POCT urinalysis dipstick, doxazosin (CARDURA) 1 MG tablet, Ambulatory referral to Urology Prostate enlargement - Plan: PSA, Urine Microscopic, POCT urinalysis dipstick, doxazosin (CARDURA) 1 MG tablet, Ambulatory referral to Urology  -Differential includes BPH with LUTS but possible firm area on the right side of his prostate.  Check PSA, refer to urology, check urinalysis, trial of low-dose doxazosin for now.  RTC precautions.  Hemorrhoids, unspecified hemorrhoid type - Plan: Lidocaine, Anorectal, 5 % GEL  -Prevention strategies discussed, no visible hemorrhoid on exam at this time.  Lidocaine anorectal gel if needed for discomfort at time of hemorrhoids with RTC precautions if persistent or can follow-up with his colorectal surgeon.  Dyslipidemia associated with type 2 diabetes mellitus ([place])  -As above, continue statin.   Meds ordered this encounter  Medications  . atorvastatin (LIPITOR) 10 MG tablet    Sig: Take 1 tablet (10 mg total) by mouth daily.    Dispense:  90 tablet    Refill:  2    Can change to #60 with 2 refills if needed for insurance.  . metFORMIN  (GLUCOPHAGE) 1000 MG tablet    Sig: TAKE 1 TABLET(1000 MG) BY MOUTH TWICE DAILY WITH A MEAL  Dispense:  180 tablet    Refill:  2  . doxycycline (VIBRA-TABS) 100 MG tablet    Sig: Take 1 tablet (100 mg total) by mouth daily. For malaria prophylaxis - continue for 1 month upon return.    Dispense:  105 tablet    Refill:  0  . Lidocaine, Anorectal, 5 % GEL    Sig: Apply pea-sized amount to the perianal skin up to 3 times per day as needed.    Dispense:  30 g    Refill:  0  . doxazosin (CARDURA) 1 MG tablet    Sig: Take 1 tablet (1 mg total) by mouth daily.    Dispense:  90 tablet    Refill:  1  . empagliflozin (JARDIANCE) 10 MG TABS tablet    Sig: Take 1 tablet (10 mg total) by mouth daily.    Dispense:  90 tablet    Refill:  2    Can change to #60 with 2 refills if needed for insurance.   Patient Instructions   I do not see any signs of infection on the wound on your abdomen today.  I do recommend calling your surgeon if you continue to have any irritation or issues with that wound.  You can try some over-the-counter hydrocortisone cream around the area if it does itch temporarily but again if that is not improving please contact your surgeon.  If you notice any redness or discharge from that area please be seen right away.  Doxycycline 1 pill once per day for malaria prophylaxis when you travel to [place], and continue for 4 weeks upon return.  No change in diabetes medications today.  I will check some labs today but A1c is not quite due.  We can check that once you have returned from out of the country.  I will check a prostate test today and certainly can have you follow-up with urology but that may be after you return from your trip.  I will check a urine test today and if there are signs of infection we can treat that differently. Can try doxazosin once per day if need for prostate symptoms for now. If any burning, or new symptoms - be seen right away.   Make sure to drink enough  fluids in diet and fiber to prevent constipation.  miralax daily if needed anusol over the counter if needed for hemorrhoids, lidocaine gel for short term if needed for pain in that area,  but follow up with myself or Dr. Marcello Moores to evaluate further if not improving.   Return to the clinic or go to the nearest emergency room if any of your symptoms worsen or new symptoms occur.   Hemorrhoids Hemorrhoids are swollen veins in and around the rectum or anus. There are two types of hemorrhoids:  Internal hemorrhoids. These occur in the veins that are just inside the rectum. They may poke through to the outside and become irritated and painful.  External hemorrhoids. These occur in the veins that are outside the anus and can be felt as a painful swelling or hard lump near the anus. Most hemorrhoids do not cause serious problems, and they can be managed with home treatments such as diet and lifestyle changes. If home treatments do not help the symptoms, procedures can be done to shrink or remove the hemorrhoids. What are the causes? This condition is caused by increased pressure in the anal area. This pressure may result from various things, including:  Constipation.  Straining  to have a bowel movement.  Diarrhea.  Pregnancy.  Obesity.  Sitting for long periods of time.  Heavy lifting or other activity that causes you to strain.  Anal sex.  Riding a bike for a long period of time. What are the signs or symptoms? Symptoms of this condition include:  Pain.  Anal itching or irritation.  Rectal bleeding.  Leakage of stool (feces).  Anal swelling.  One or more lumps around the anus. How is this diagnosed? This condition can often be diagnosed through a visual exam. Other exams or tests may also be done, such as:  An exam that involves feeling the rectal area with a gloved hand (digital rectal exam).  An exam of the anal canal that is done using a small tube (anoscope).  A  blood test, if you have lost a significant amount of blood.  A test to look inside the colon using a flexible tube with a camera on the end (sigmoidoscopy or colonoscopy). How is this treated? This condition can usually be treated at home. However, various procedures may be done if dietary changes, lifestyle changes, and other home treatments do not help your symptoms. These procedures can help make the hemorrhoids smaller or remove them completely. Some of these procedures involve surgery, and others do not. Common procedures include:  Rubber band ligation. Rubber bands are placed at the base of the hemorrhoids to cut off their blood supply.  Sclerotherapy. Medicine is injected into the hemorrhoids to shrink them.  Infrared coagulation. A type of light energy is used to get rid of the hemorrhoids.  Hemorrhoidectomy surgery. The hemorrhoids are surgically removed, and the veins that supply them are tied off.  Stapled hemorrhoidopexy surgery. The surgeon staples the base of the hemorrhoid to the rectal wall. Follow these instructions at home: Eating and drinking  Eat foods that have a lot of fiber in them, such as whole grains, beans, nuts, fruits, and vegetables.  Ask your health care provider about taking products that have added fiber (fiber supplements).  Reduce the amount of fat in your diet. You can do this by eating low-fat dairy products, eating less red meat, and avoiding processed foods.  Drink enough fluid to keep your urine pale yellow.   Managing pain and swelling  Take warm sitz baths for 20 minutes, 3-4 times a day to ease pain and discomfort. You may do this in a bathtub or using a portable sitz bath that fits over the toilet.  If directed, apply ice to the affected area. Using ice packs between sitz baths may be helpful. ? Put ice in a plastic bag. ? Place a towel between your skin and the bag. ? Leave the ice on for 20 minutes, 2-3 times a day.   General  instructions  Take over-the-counter and prescription medicines only as told by your health care provider.  Use medicated creams or suppositories as told.  Get regular exercise. Ask your health care provider how much and what kind of exercise is best for you. In general, you should do moderate exercise for at least 30 minutes on most days of the week (150 minutes each week). This can include activities such as walking, biking, or yoga.  Go to the bathroom when you have the urge to have a bowel movement. Do not wait.  Avoid straining to have bowel movements.  Keep the anal area dry and clean. Use wet toilet paper or moist towelettes after a bowel movement.  Do not sit  on the toilet for long periods of time. This increases blood pooling and pain.  Keep all follow-up visits as told by your health care provider. This is important. Contact a health care provider if you have:  Increasing pain and swelling that are not controlled by treatment or medicine.  Difficulty having a bowel movement, or you are unable to have a bowel movement.  Pain or inflammation outside the area of the hemorrhoids. Get help right away if you have:  Uncontrolled bleeding from your rectum. Summary  Hemorrhoids are swollen veins in and around the rectum or anus.  Most hemorrhoids can be managed with home treatments such as diet and lifestyle changes.  Taking warm sitz baths can help ease pain and discomfort.  In severe cases, procedures or surgery can be done to shrink or remove the hemorrhoids. This information is not intended to replace advice given to you by your health care provider. Make sure you discuss any questions you have with your health care provider. Document Revised: 08/11/2018 Document Reviewed: 08/04/2017 Elsevier Patient Education  2021 [place].   Return to the clinic or go to the nearest emergency room if any of your symptoms worsen or new symptoms occur.      Signed, [name], MD [facility]

## 2020-08-20 NOTE — Patient Instructions (Addendum)
I do not see any signs of infection on the wound on your abdomen today.  I do recommend calling your surgeon if you continue to have any irritation or issues with that wound.  You can try some over-the-counter hydrocortisone cream around the area if it does itch temporarily but again if that is not improving please contact your surgeon.  If you notice any redness or discharge from that area please be seen right away.  Doxycycline 1 pill once per day for malaria prophylaxis when you travel to [place], and continue for 4 weeks upon return.  No change in diabetes medications today.  I will check some labs today but A1c is not quite due.  We can check that once you have returned from out of the country.  I will check a prostate test today and certainly can have you follow-up with urology but that may be after you return from your trip.  I will check a urine test today and if there are signs of infection we can treat that differently. Can try doxazosin once per day if need for prostate symptoms for now. If any burning, or new symptoms - be seen right away.   Make sure to drink enough fluids in diet and fiber to prevent constipation.  miralax daily if needed anusol over the counter if needed for hemorrhoids, lidocaine gel for short term if needed for pain in that area,  but follow up with myself or Dr. Marcello Moores to evaluate further if not improving.   Return to the clinic or go to the nearest emergency room if any of your symptoms worsen or new symptoms occur.   Hemorrhoids Hemorrhoids are swollen veins in and around the rectum or anus. There are two types of hemorrhoids:  Internal hemorrhoids. These occur in the veins that are just inside the rectum. They may poke through to the outside and become irritated and painful.  External hemorrhoids. These occur in the veins that are outside the anus and can be felt as a painful swelling or hard lump near the anus. Most hemorrhoids do not cause serious problems,  and they can be managed with home treatments such as diet and lifestyle changes. If home treatments do not help the symptoms, procedures can be done to shrink or remove the hemorrhoids. What are the causes? This condition is caused by increased pressure in the anal area. This pressure may result from various things, including:  Constipation.  Straining to have a bowel movement.  Diarrhea.  Pregnancy.  Obesity.  Sitting for long periods of time.  Heavy lifting or other activity that causes you to strain.  Anal sex.  Riding a bike for a long period of time. What are the signs or symptoms? Symptoms of this condition include:  Pain.  Anal itching or irritation.  Rectal bleeding.  Leakage of stool (feces).  Anal swelling.  One or more lumps around the anus. How is this diagnosed? This condition can often be diagnosed through a visual exam. Other exams or tests may also be done, such as:  An exam that involves feeling the rectal area with a gloved hand (digital rectal exam).  An exam of the anal canal that is done using a small tube (anoscope).  A blood test, if you have lost a significant amount of blood.  A test to look inside the colon using a flexible tube with a camera on the end (sigmoidoscopy or colonoscopy). How is this treated? This condition can usually be treated at  home. However, various procedures may be done if dietary changes, lifestyle changes, and other home treatments do not help your symptoms. These procedures can help make the hemorrhoids smaller or remove them completely. Some of these procedures involve surgery, and others do not. Common procedures include:  Rubber band ligation. Rubber bands are placed at the base of the hemorrhoids to cut off their blood supply.  Sclerotherapy. Medicine is injected into the hemorrhoids to shrink them.  Infrared coagulation. A type of light energy is used to get rid of the hemorrhoids.  Hemorrhoidectomy surgery.  The hemorrhoids are surgically removed, and the veins that supply them are tied off.  Stapled hemorrhoidopexy surgery. The surgeon staples the base of the hemorrhoid to the rectal wall. Follow these instructions at home: Eating and drinking  Eat foods that have a lot of fiber in them, such as whole grains, beans, nuts, fruits, and vegetables.  Ask your health care provider about taking products that have added fiber (fiber supplements).  Reduce the amount of fat in your diet. You can do this by eating low-fat dairy products, eating less red meat, and avoiding processed foods.  Drink enough fluid to keep your urine pale yellow.   Managing pain and swelling  Take warm sitz baths for 20 minutes, 3-4 times a day to ease pain and discomfort. You may do this in a bathtub or using a portable sitz bath that fits over the toilet.  If directed, apply ice to the affected area. Using ice packs between sitz baths may be helpful. ? Put ice in a plastic bag. ? Place a towel between your skin and the bag. ? Leave the ice on for 20 minutes, 2-3 times a day.   General instructions  Take over-the-counter and prescription medicines only as told by your health care provider.  Use medicated creams or suppositories as told.  Get regular exercise. Ask your health care provider how much and what kind of exercise is best for you. In general, you should do moderate exercise for at least 30 minutes on most days of the week (150 minutes each week). This can include activities such as walking, biking, or yoga.  Go to the bathroom when you have the urge to have a bowel movement. Do not wait.  Avoid straining to have bowel movements.  Keep the anal area dry and clean. Use wet toilet paper or moist towelettes after a bowel movement.  Do not sit on the toilet for long periods of time. This increases blood pooling and pain.  Keep all follow-up visits as told by your health care provider. This is  important. Contact a health care provider if you have:  Increasing pain and swelling that are not controlled by treatment or medicine.  Difficulty having a bowel movement, or you are unable to have a bowel movement.  Pain or inflammation outside the area of the hemorrhoids. Get help right away if you have:  Uncontrolled bleeding from your rectum. Summary  Hemorrhoids are swollen veins in and around the rectum or anus.  Most hemorrhoids can be managed with home treatments such as diet and lifestyle changes.  Taking warm sitz baths can help ease pain and discomfort.  In severe cases, procedures or surgery can be done to shrink or remove the hemorrhoids. This information is not intended to replace advice given to you by your health care provider. Make sure you discuss any questions you have with your health care provider. Document Revised: 08/11/2018 Document Reviewed: 08/04/2017 Elsevier  Patient Education  2021 [employer].   Return to the clinic or go to the nearest emergency room if any of your symptoms worsen or new symptoms occur.

## 2020-08-21 ENCOUNTER — Encounter: Payer: Self-pay | Admitting: Family Medicine

## 2020-11-24 ENCOUNTER — Other Ambulatory Visit: Payer: Self-pay

## 2020-11-24 ENCOUNTER — Ambulatory Visit (INDEPENDENT_AMBULATORY_CARE_PROVIDER_SITE_OTHER): Payer: 59 | Admitting: Family Medicine

## 2020-11-24 ENCOUNTER — Encounter: Payer: Self-pay | Admitting: Family Medicine

## 2020-11-24 VITALS — BP 124/64 | HR 76 | Temp 98.0°F | Resp 17 | Ht 76.0 in | Wt 194.0 lb

## 2020-11-24 DIAGNOSIS — Z23 Encounter for immunization: Secondary | ICD-10-CM | POA: Diagnosis not present

## 2020-11-24 DIAGNOSIS — E785 Hyperlipidemia, unspecified: Secondary | ICD-10-CM | POA: Diagnosis not present

## 2020-11-24 DIAGNOSIS — N400 Enlarged prostate without lower urinary tract symptoms: Secondary | ICD-10-CM

## 2020-11-24 DIAGNOSIS — E119 Type 2 diabetes mellitus without complications: Secondary | ICD-10-CM

## 2020-11-24 LAB — PSA: PSA: 3.81 ng/mL (ref 0.10–4.00)

## 2020-11-24 LAB — COMPREHENSIVE METABOLIC PANEL
ALT: 15 U/L (ref 0–53)
AST: 15 U/L (ref 0–37)
Albumin: 4.4 g/dL (ref 3.5–5.2)
Alkaline Phosphatase: 51 U/L (ref 39–117)
BUN: 13 mg/dL (ref 6–23)
CO2: 31 mEq/L (ref 19–32)
Calcium: 10.1 mg/dL (ref 8.4–10.5)
Chloride: 100 mEq/L (ref 96–112)
Creatinine, Ser: 1.04 mg/dL (ref 0.40–1.50)
GFR: 85.01 mL/min (ref >60.00)
Glucose, Bld: 93 mg/dL (ref 70–99)
Potassium: 4.2 mEq/L (ref 3.5–5.1)
Sodium: 138 mEq/L (ref 135–145)
Total Bilirubin: 0.4 mg/dL (ref 0.2–1.2)
Total Protein: 8.2 g/dL (ref 6.0–8.3)

## 2020-11-24 LAB — LIPID PANEL
Cholesterol: 174 mg/dL (ref 0–200)
HDL: 39.7 mg/dL (ref >39.00)
LDL Cholesterol: 100 mg/dL — ABNORMAL HIGH (ref 0–99)
NonHDL: 134.64
Total CHOL/HDL Ratio: 4
Triglycerides: 171 mg/dL — ABNORMAL HIGH (ref 0.0–149.0)
VLDL: 34.2 mg/dL (ref 0.0–40.0)

## 2020-11-24 LAB — MICROALBUMIN / CREATININE URINE RATIO
Creatinine,U: 136.5 mg/dL
Microalb Creat Ratio: 0.8 mg/g (ref 0.0–30.0)
Microalb, Ur: 1.1 mg/dL (ref 0.0–1.9)

## 2020-11-24 LAB — HEMOGLOBIN A1C: Hgb A1c MFr Bld: 6.8 % — ABNORMAL HIGH (ref 4.6–6.5)

## 2020-11-24 NOTE — Patient Instructions (Addendum)
Here is number for urologist - if they need a new referral let me know.  [facility] [phone]  No change in medications for now.  Follow-up in 6 months as long as labs look okay.  The area on your scar appears to be a possible keloid or extra scar tissue.   Follow-up with your surgeon if concerns regarding that scar.  Let me know if there are questions

## 2020-11-24 NOTE — Progress Notes (Signed)
Subjective:  Patient ID: Kevin Evans, male    DOB: 09/25/1970  Age: 50 y.o. MRN: [id number]  CC:  Chief Complaint  Patient presents with   Diabetes    Pt due for recheck lab work, no concerns    Hyperlipidemia    Pt due for recheck lab work, pt denies concerns    Immunizations    Pt agreeable to pneumonia vaccine     HPI Kevin Evans presents for   Diabetes: With prior hyperglycemia, treated with metformin $RemoveBefore'1000mg'PAsymhlYpnoNN$  BID, Jardiance $RemoveBefore'10mg'yaHrQmthvswSj$  QD.no new side effects with meds. , stable at March 14 visit. He is on statin with Lipitor 10 mg daily, Microalbumin: Normal ratio April 2021 Optho, foot exam, pneumovax: Up-to-date, plan for pneumonia vaccination today. Home readings: fasting 117.  2hr pp: none.   Lab Results  Component Value Date   HGBA1C 6.8 (H) 11/24/2020   HGBA1C 6.3 (H) 06/09/2020   HGBA1C 6.4 (H) 03/31/2020   Lab Results  Component Value Date   MICROALBUR 1.1 11/24/2020   LDLCALC 100 (H) 11/24/2020   CREATININE 1.04 11/24/2020   Hyperlipidemia: Lipitor 10 mg daily. No new myalgias.  Lab Results  Component Value Date   CHOL 174 11/24/2020   HDL 39.70 11/24/2020   LDLCALC 100 (H) 11/24/2020   TRIG 171.0 (H) 11/24/2020   CHOLHDL 4 11/24/2020   Lab Results  Component Value Date   ALT 15 11/24/2020   AST 15 11/24/2020   ALKPHOS 51 11/24/2020   BILITOT 0.4 11/24/2020   Prostatomegaly, nocturia, urinary frequency Discussed in May, refer to urology.  Started on doxazosin 1 mg daily. Better if taking med at night. Helped with nocturia.no dysuria, hematuria. Some incomplete emptying. Referral closed - unable to contact patient, he was out of country.  Unable to access mychart. 4.5 and 2.48 yr old girls in [place].  Lab Results  Component Value Date   PSA 3.81 11/24/2020    Itching at prior surgical scar, no pain/redness/drainage.  Thick scar, no known hx of keloids.    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 denies at preop   Past Surgical History:  Procedure Laterality Date   APPENDECTOMY  06/20/2020   COLONOSCOPY     LAPAROSCOPIC RIGHT HEMI COLECTOMY N/A 06/20/2020   Procedure: LAPAROSCOPIC RIGHT HEMI COLECTOMY;  Surgeon: Leighton Ruff, MD;  Location: [facility];  Service: General;  Laterality: N/A;   NO PAST SURGERIES     UPPER GASTROINTESTINAL ENDOSCOPY     Allergies  Allergen Reactions   Other Itching and Swelling   Bactrim [Sulfamethoxazole-Trimethoprim] Itching    Itching all over body    Shrimp (Diagnostic)     Under cooked shrimp, facial swelling   Prior to Admission medications   Medication Sig Start Date End Date Taking? Authorizing Provider  atorvastatin (LIPITOR) 10 MG tablet Take 1 tablet (10 mg total) by mouth daily. 08/20/20  Yes Wendie Agreste, MD  blood glucose meter kit and supplies Dispense based on patient and insurance preference.check up to once per day. 01/01/20  Yes Horald Pollen, MD  Blood Glucose Monitoring  Suppl w/Device KIT Monitor glucose twice daily 07/04/15  Yes Bush, Benjaman Pott, PA-C  empagliflozin (JARDIANCE) 10 MG TABS tablet Take 1 tablet (10 mg total) by mouth daily. 08/20/20  Yes Wendie Agreste, MD  Lidocaine, Anorectal, 5 % GEL Apply pea-sized amount to the perianal skin up to 3 times per day as needed. 08/20/20  Yes Wendie Agreste, MD  metFORMIN (GLUCOPHAGE) 1000 MG tablet TAKE 1 TABLET(1000 MG) BY MOUTH TWICE DAILY WITH A MEAL 08/20/20  Yes Wendie Agreste, MD  triamcinolone (KENALOG) 0.1 % Apply 1 application topically 2 (two) times daily. 06/11/20  Yes Wendie Agreste, MD  doxazosin (CARDURA) 1 MG tablet Take 1 tablet (1 mg total) by  mouth daily. Patient not taking: Reported on 11/24/2020 08/20/20   Wendie Agreste, MD  doxycycline (VIBRA-TABS) 100 MG tablet Take 1 tablet (100 mg total) by mouth daily. For malaria prophylaxis - continue for 1 month upon return. Patient not taking: Reported on 11/24/2020 08/20/20   Wendie Agreste, MD  traMADol (ULTRAM) 50 MG tablet Take 1-2 tablets (50-100 mg total) by mouth every 6 (six) hours as needed for moderate pain or severe pain. Patient not taking: Reported on 11/24/2020 4/40/10   Leighton Ruff, MD   Social History   Socioeconomic History   Marital status: Single    Spouse name: Not on file   Number of children: 0   Years of education: Not on file   Highest education level: Not on file  Occupational History   Occupation: truck driver  Tobacco Use   Smoking status: Never   Smokeless tobacco: Never  Vaping Use   Vaping Use: Never used  Substance and Sexual Activity   Alcohol use: No    Alcohol/week: 0.0 standard drinks   Drug use: No   Sexual activity: Yes    Birth control/protection: Condom  Other Topics Concern   Not on file  Social History Narrative   Not on file   Social Determinants of Health   Financial Resource Strain: Not on file  Food Insecurity: Not on file  Transportation Needs: Not on file  Physical Activity: Not on file  Stress: Not on file  Social Connections: Not on file  Intimate Partner Violence: Not on file    Review of Systems  Constitutional:  Negative for fatigue and unexpected weight change.  Eyes:  Negative for visual disturbance.  Respiratory:  Negative for cough, chest tightness and shortness of breath.   Cardiovascular:  Negative for chest pain, palpitations and leg swelling.  Gastrointestinal:  Negative for abdominal pain and blood in stool.  Neurological:  Negative for dizziness, light-headedness and headaches.    Objective:   Vitals:   11/24/20 1049  BP: 124/64  Pulse: 76  Resp: 17  Temp: 98 F (36.7 C)  TempSrc:  Temporal  SpO2: 97%  Weight: 194 lb (88 kg)  Height: $Remove'6\' 4"'QEwMvcD$  (1.93 m)     Physical Exam Vitals reviewed.  Constitutional:      Appearance: He is well-developed.  HENT:     Head: Normocephalic and atraumatic.  Neck:     Vascular: No carotid bruit or JVD.  Cardiovascular:     Rate and Rhythm: Normal rate and regular rhythm.     Heart sounds: Normal heart sounds. No murmur heard. Pulmonary:     Effort: Pulmonary effort is normal.     Breath sounds: Normal breath sounds. No rales.  Abdominal:    Musculoskeletal:     Right lower  leg: No edema.     Left lower leg: No edema.  Skin:    General: Skin is warm and dry.  Neurological:     Mental Status: He is alert and oriented to person, place, and time.  Psychiatric:        Mood and Affect: Mood normal.       Assessment & Plan:  Kevin Evans is a 50 y.o. male . Type 2 diabetes mellitus without complication, without long-term current use of insulin (HCC) - Plan: Microalbumin / creatinine urine ratio, Comprehensive metabolic panel, Lipid panel, Hemoglobin A1c  - check labs, no med changes for now.   Need for pneumococcal vaccination - Plan: Pneumococcal conjugate vaccine 20-valent (Prevnar 20)  Prostate enlargement - Plan: PSA  - stable nocturia. Advised need for follow up with urology based on last exam. Will check PSA,number provided for urology to schedule appt - prior referral placed.   Hyperlipidemia, unspecified hyperlipidemia type - Plan: Comprehensive metabolic panel, Lipid panel   Check labs. Continue lipitor.   Possible keloid at surgical scar - follow up with general surgery as planned, rtc/earlier appt with surgery if redness/pain/changes at scar.   No orders of the defined types were placed in this encounter.  Patient Instructions  Here is number for urologist - if they need a new referral let me know.  [facility] [phone]  No change in medications for now.  Follow-up in 6 months as long as labs  look okay.  The area on your scar appears to be a possible keloid or extra scar tissue.   Follow-up with your surgeon if concerns regarding that scar.  Let me know if there are questions     Signed,   [name], MD [place], [facility] 11/24/20 10:24 PM

## 2021-02-03 LAB — HM DIABETES EYE EXAM

## 2021-02-06 ENCOUNTER — Encounter: Payer: Self-pay | Admitting: Family Medicine

## 2021-04-07 ENCOUNTER — Other Ambulatory Visit: Payer: Self-pay | Admitting: Family Medicine

## 2021-04-07 DIAGNOSIS — N400 Enlarged prostate without lower urinary tract symptoms: Secondary | ICD-10-CM

## 2021-04-07 DIAGNOSIS — R351 Nocturia: Secondary | ICD-10-CM

## 2021-04-07 DIAGNOSIS — R350 Frequency of micturition: Secondary | ICD-10-CM

## 2021-04-08 ENCOUNTER — Telehealth: Payer: Self-pay | Admitting: Family Medicine

## 2021-04-08 DIAGNOSIS — E119 Type 2 diabetes mellitus without complications: Secondary | ICD-10-CM

## 2021-04-08 MED ORDER — DAPAGLIFLOZIN PROPANEDIOL 5 MG PO TABS: 5.0000 mg | ORAL_TABLET | Freq: Every day | ORAL | 1 refills | Status: DC

## 2021-04-08 NOTE — Addendum Note (Signed)
Addended by: [name] R on: 04/08/2021 10:02 PM   Modules accepted: Orders

## 2021-04-08 NOTE — Telephone Encounter (Signed)
Pt is asking for alternative to jardiance, cannot complete PA within allotted time

## 2021-04-08 NOTE — Telephone Encounter (Signed)
Sent farxiga to his pharmacy in place of [facility]- please call and advise. Not sure when he will be out of town, but hopefully can pick up before he leaves. If not, then ok as his A1c was 6.8 last visit and ok if his readings go up slightly off SGLT2 until he gets back. Let me know if questions.

## 2021-04-08 NOTE — Telephone Encounter (Signed)
Pt called back again checking on this

## 2021-04-08 NOTE — Telephone Encounter (Signed)
Pt called in stating that the pharmacy told pt to call our office that pt's insurance doesn't want to cover the [place]. He is living to go out of the country tomorrow and wanted to know what he needs to do about this.   Please advise   Pt uses [facility] on [address]

## 2021-04-09 NOTE — Telephone Encounter (Signed)
Called pt and informed he voiced understanding, no questions at this time does believe this was covered per what pharmacy had said

## 2021-05-09 ENCOUNTER — Other Ambulatory Visit: Payer: Self-pay | Admitting: Family Medicine

## 2021-05-09 DIAGNOSIS — E785 Hyperlipidemia, unspecified: Secondary | ICD-10-CM

## 2021-05-09 DIAGNOSIS — N400 Enlarged prostate without lower urinary tract symptoms: Secondary | ICD-10-CM

## 2021-05-09 DIAGNOSIS — R350 Frequency of micturition: Secondary | ICD-10-CM

## 2021-05-09 DIAGNOSIS — R351 Nocturia: Secondary | ICD-10-CM

## 2021-05-09 DIAGNOSIS — E119 Type 2 diabetes mellitus without complications: Secondary | ICD-10-CM

## 2021-05-27 ENCOUNTER — Ambulatory Visit: Payer: 59 | Admitting: Family Medicine

## 2021-06-26 ENCOUNTER — Other Ambulatory Visit (HOSPITAL_COMMUNITY): Payer: Self-pay | Admitting: General Surgery

## 2021-06-26 ENCOUNTER — Other Ambulatory Visit: Payer: Self-pay | Admitting: General Surgery

## 2021-06-26 DIAGNOSIS — D373 Neoplasm of uncertain behavior of appendix: Secondary | ICD-10-CM

## 2021-07-23 ENCOUNTER — Other Ambulatory Visit: Payer: Self-pay | Admitting: General Surgery

## 2021-07-27 ENCOUNTER — Ambulatory Visit
Admission: RE | Admit: 2021-07-27 | Discharge: 2021-07-27 | Disposition: A | Discharge status: Home / Self Care | Payer: Managed Care, Other (non HMO) | Source: Ambulatory Visit | Attending: General Surgery | Admitting: General Surgery

## 2021-07-27 ENCOUNTER — Encounter (HOSPITAL_COMMUNITY): Payer: Self-pay

## 2021-07-27 ENCOUNTER — Other Ambulatory Visit: Payer: Self-pay | Admitting: General Surgery

## 2021-07-27 ENCOUNTER — Ambulatory Visit (HOSPITAL_COMMUNITY): Payer: Managed Care, Other (non HMO)

## 2021-07-27 DIAGNOSIS — D373 Neoplasm of uncertain behavior of appendix: Secondary | ICD-10-CM

## 2021-07-27 MED ORDER — IOPAMIDOL (ISOVUE-300) INJECTION 61%
100.0000 mL | Freq: Once | INTRAVENOUS | Status: AC | PRN
  Administered 2021-07-27: 100 mL via INTRAVENOUS

## 2021-07-29 ENCOUNTER — Ambulatory Visit: Payer: Self-pay | Admitting: Family Medicine

## 2021-08-05 ENCOUNTER — Ambulatory Visit (INDEPENDENT_AMBULATORY_CARE_PROVIDER_SITE_OTHER): Payer: Commercial Managed Care - HMO | Admitting: Family Medicine

## 2021-08-05 VITALS — BP 134/76 | HR 58 | Temp 98.1°F | Resp 16 | Ht 76.0 in | Wt 189.6 lb

## 2021-08-05 DIAGNOSIS — N400 Enlarged prostate without lower urinary tract symptoms: Secondary | ICD-10-CM | POA: Diagnosis not present

## 2021-08-05 DIAGNOSIS — R9389 Abnormal findings on diagnostic imaging of other specified body structures: Secondary | ICD-10-CM

## 2021-08-05 DIAGNOSIS — E119 Type 2 diabetes mellitus without complications: Secondary | ICD-10-CM | POA: Diagnosis not present

## 2021-08-05 DIAGNOSIS — R300 Dysuria: Secondary | ICD-10-CM

## 2021-08-05 DIAGNOSIS — R350 Frequency of micturition: Secondary | ICD-10-CM

## 2021-08-05 DIAGNOSIS — E785 Hyperlipidemia, unspecified: Secondary | ICD-10-CM

## 2021-08-05 DIAGNOSIS — R351 Nocturia: Secondary | ICD-10-CM

## 2021-08-05 MED ORDER — DOXAZOSIN MESYLATE 4 MG PO TABS: 4.0000 mg | ORAL_TABLET | Freq: Every day | ORAL | 2 refills | Status: DC

## 2021-08-05 MED ORDER — DAPAGLIFLOZIN PROPANEDIOL 5 MG PO TABS: 5.0000 mg | ORAL_TABLET | Freq: Every day | ORAL | 1 refills | Status: DC

## 2021-08-05 MED ORDER — METFORMIN HCL 1000 MG PO TABS: ORAL_TABLET | ORAL | 1 refills | Status: DC

## 2021-08-05 MED ORDER — ATORVASTATIN CALCIUM 10 MG PO TABS: 10.0000 mg | ORAL_TABLET | Freq: Every day | ORAL | 1 refills | Status: DC

## 2021-08-05 NOTE — Progress Notes (Signed)
? ?Subjective:  ?Patient ID: Kevin Evans, male    DOB: Feb 01, 1971  Age: 51 y.o. MRN: [id number] ? ?CC:  ?Chief Complaint  ?Patient presents with  ? Diabetes  ?  Due for labs no concerns   ? Hyperlipidemia  ?  Due for recheck no concerns   ? Benign Prostatic Hypertrophy  ?  Pt would like to revisit about enlarged prostate notes while in [place] was not working well saw dr and was raised to 4 mg from 1 and has been doing better, wanted to check in now that he is state side   ? ? ?HPI ?Kevin Evans presents for  ? ?Diabetes: ?Without Complication.  Prior hyperglycemia.  Farxiga 87m was prescribed in place of Jardiance due to availability/coverage.  Metformin 1000 mg twice daily.  He is on statin with Lipitor 10 mg daily. ?Last visit with me in August 2022. ?Home readings fasting: 109. Low 100's usually.  ?Home readings postprandial 86 today.  ?Symptomatic lows.none.  ?Microalbumin: Normal ratio 11/24/2020. ?Optho, foot exam, pneumovax: Up-to-date ? ?Lab Results  ?Component Value Date  ? HGBA1C 6.8 (H) 11/24/2020  ? HGBA1C 6.3 (H) 06/09/2020  ? HGBA1C 6.4 (H) 03/31/2020  ? ?Lab Results  ?Component Value Date  ? MICROALBUR 1.1 11/24/2020  ? LChatmoss100 (H) 11/24/2020  ? CREATININE 1.04 11/24/2020  ? ?Hyperlipidemia: ?Lipitor 11103mqd. No new side effects/myalgias.  ?Lab Results  ?Component Value Date  ? CHOL 174 11/24/2020  ? HDL 39.70 11/24/2020  ? LDSpringdale00 (H) 11/24/2020  ? TRIG 171.0 (H) 11/24/2020  ? CHOLHDL 4 11/24/2020  ? ?Lab Results  ?Component Value Date  ? ALT 15 11/24/2020  ? AST 15 11/24/2020  ? ALKPHOS 51 11/24/2020  ? BILITOT 0.4 11/24/2020  ? ?BPH with LUTS ?Treated with doxazosin 1 mg previously.  As above he [place] - increased nocturia - up to 5-6 times per night. [place]  Higher dose worked better - 4 mg. Still taking 103m11mose - working well. No new side effects on this dose.  ?No dysuria. No  fever/abd/back pain.  ?Nocturia now once per night.  ?Lab Results  ?Component Value Date  ? PSA 3.81 11/24/2020  ?Follow-up visit with his surgeon on May 1 with history of low-grade mucinous neoplasm, status post appendectomy.  CT scan obtained, without recurrent metastatic disease within the abdomen or pelvis.  Moderate prostatomegaly. ?CT chest indicated pulmonary nodules, with plan for repeat chest CT in 3 months.  ?Referred to urology in May last year -possible nodule on right.  unable to be seen due to out of country travel.  ?Dysuria at times since abdominal surgery - no recent changes.  ? ? ? ?History ?Patient Active Problem List  ? Diagnosis Date Noted  ? 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 Hyperlipidemia 03/09/2017  ? [place]  ? ?Past Medical History:  ?Diagnosis Date  ? [place] mellitus without [place] ([place] ? type 2  ? GERD (gastroesophageal reflux disease)   ? hx of  ? HLD (hyperlipidemia)   ? Hyperlipidemia   ? [employer] 06/10/2020  ? Hypertension   ? No meds pt. denies at preop  ? ?Past Surgical History:  ?Procedure Laterality Date  ? APPENDECTOMY  06/20/2020  ? COLONOSCOPY    ? LAPAROSCOPIC RIGHT HEMI COLECTOMY N/A 06/20/2020  ? Procedure: LAPAROSCOPIC RIGHT HEMI COLECTOMY;  Surgeon: ThoLeighton RuffD;  [facility];  Service: General;  Laterality: N/A;  ? NO PAST SURGERIES    ? UPPER GASTROINTESTINAL ENDOSCOPY    ? ?Allergies  ?Allergen Reactions  ? Other Itching and Swelling  ? Bactrim [Sulfamethoxazole-Trimethoprim] Itching  ?  Itching all over body   ? Shrimp (Diagnostic)   ?  Under cooked shrimp, facial swelling  ? ?Prior to Admission medications   ?Medication Sig Start Date End Date Taking? Authorizing Provider  ?atorvastatin (LIPITOR) 10 MG tablet Take 1 tablet by mouth once daily 05/11/21  Yes Wendie Agreste, MD  ?blood glucose meter kit and supplies Dispense based on patient and insurance preference.check up to once per day. 01/01/20  Yes Horald Pollen, MD   ?Blood Glucose Monitoring Suppl w/Device KIT Monitor glucose twice daily 07/04/15  Yes Ezekiel Slocumb, PA-C  ?dapagliflozin propanediol (FARXIGA) 5 MG TABS tablet Take 1 tablet (5 mg total) by mouth daily before breakfast. 04/08/21  Yes Wendie Agreste, MD  ?doxazosin (CARDURA) 1 MG tablet Take 1 tablet by mouth once daily ?Patient taking differently: Take 4 mg by mouth daily. 05/11/21  Yes Wendie Agreste, MD  ?Lidocaine, Anorectal, 5 % GEL Apply pea-sized amount to the perianal skin up to 3 times per day as needed. 08/20/20  Yes Wendie Agreste, MD  ?metFORMIN (GLUCOPHAGE) 1000 MG tablet TAKE 1 TABLET BY MOUTH TWICE DAILY WITH A MEAL 05/11/21  Yes Wendie Agreste, MD  ?triamcinolone (KENALOG) 0.1 % Apply 1 application topically 2 (two) times daily. 06/11/20  Yes Wendie Agreste, MD  ? ?Social History  ? ?Socioeconomic History  ? Marital status: Single  ?  Spouse name: Not on file  ? Number of children: 0  ? Years of education: Not on file  ? Highest education level: Not on file  ?Occupational History  ? Occupation: truck Geophysicist/field seismologist  ?Tobacco Use  ? Smoking status: Never  ? Smokeless tobacco: Never  ?Vaping Use  ? Vaping Use: Never used  ?Substance and Sexual Activity  ? Alcohol use: No  ?  Alcohol/week: 0.0 standard drinks  ? Drug use: No  ? Sexual activity: Yes  ?  Birth control/protection: Condom  ?Other Topics Concern  ? Not on file  ?Social History Narrative  ? Not on file  ? ?Social Determinants of Health  ? ?Financial Resource Strain: Not on file  ?Food Insecurity: Not on file  ?Transportation Needs: Not on file  ?Physical Activity: Not on file  ?Stress: Not on file  ?Social Connections: Not on file  ?Intimate Partner Violence: Not on file  ? ? ?Review of Systems ? ? ?Objective:  ? ?Vitals:  ? 08/05/21 1425  ?BP: 134/76  ?Pulse: (!) 58  ?Resp: 16  ?Temp: 98.1 ?F (36.7 ?C)  ?TempSrc: Temporal  ?SpO2: 97%  ?Weight: 189 lb 9.6 oz (86 kg)  ?Height: _0  (1.93 m)  ? ? ? ?Physical Exam ?Vitals reviewed.   ?Constitutional:   ?   Appearance: He is well-developed.  ?HENT:  ?   Head: Normocephalic and atraumatic.  ?Neck:  ?   Vascular: No carotid bruit or JVD.  ?Cardiovascular:  ?   Rate and Rhythm: Normal rate and regular rhythm.  ?   Heart sounds: Normal heart sounds. No murmur heard. ?Pulmonary:  ?   Effort: Pulmonary effort is normal.  ?   Breath sounds: Normal breath sounds. No rales.  ?Abdominal:  ?   General: There is distension.  ?   Tenderness: There is no  abdominal tenderness. There is no right CVA tenderness, left CVA tenderness or guarding.  ?Musculoskeletal:  ?   Right lower leg: No edema.  ?   Left lower leg: No edema.  ?Skin: ?   General: Skin is warm and dry.  ?Neurological:  ?   Mental Status: He is alert and oriented to person, place, and time.  ?Psychiatric:     ?   Mood and Affect: Mood normal.  ? ? ? ? ? ? ?Assessment & Plan:  ?Kamil Mchaffie is a 51 y.o. male . ?Type 2 diabetes mellitus without complication, without long-term current use of insulin (HCC) - Plan: Hemoglobin A1c, dapagliflozin propanediol (FARXIGA) 5 MG TABS tablet ?Controlled type 2 diabetes mellitus without complication, without long-term current use of insulin ([place]) - Plan: metFORMIN (GLUCOPHAGE) 1000 MG tablet ? -Check updated A1c, continue Farxiga, metformin same doses for now.  Reassuring home readings without hypoglycemia ? ?Hyperlipidemia, unspecified hyperlipidemia type - Plan: Comprehensive metabolic panel, Lipid panel, atorvastatin (LIPITOR) 10 MG tablet ? -  Stable, tolerating current regimen. Medications refilled. Labs pending as above.  ? ?Prostate enlargement - Plan: PSA, Ambulatory referral to Urology, doxazosin (CARDURA) 4 MG tablet ?Nocturia - Plan: PSA, Ambulatory referral to Urology, doxazosin (CARDURA) 4 MG tablet ?Dysuria - Plan: PSA, Urine Microscopic, POCT urinalysis dipstick ?Urinary frequency - Plan: doxazosin (CARDURA) 4 MG tablet ? -We will repeat referral to urology given previous concern for possible nodule  and borderline PSA.  Repeat PSA ordered.  With increased nocturia and requirement for higher dosing of doxazosin, also will have follow-up with urology but continue 4 mg Cardura for now.  Urine testing as above but less li

## 2021-08-05 NOTE — Patient Instructions (Signed)
Continue 4 mg dose of doxazosin for now, no other medication changes.  I will check some labs including updated prostate test but have referred you again to urology.  Make sure to follow-up for that appointment.  If you do not hear from their office in the next 2 weeks let me know.  If any new or worsening symptoms return for recheck, otherwise follow-up with me in 6 months for diabetes and medication refills.  Follow-up with your surgeon for repeat lung screening/CT scan in the next 3 months but if any new cough or other new symptoms be seen sooner. ?Take care.  ?

## 2021-08-06 LAB — COMPREHENSIVE METABOLIC PANEL
ALT: 16 U/L (ref 0–53)
AST: 18 U/L (ref 0–37)
Albumin: 4.7 g/dL (ref 3.5–5.2)
Alkaline Phosphatase: 48 U/L (ref 39–117)
BUN: 14 mg/dL (ref 6–23)
CO2: 29 mEq/L (ref 19–32)
Calcium: 9.6 mg/dL (ref 8.4–10.5)
Chloride: 100 mEq/L (ref 96–112)
Creatinine, Ser: 1.1 mg/dL (ref 0.40–1.50)
GFR: 79.08 mL/min (ref >60.00)
Glucose, Bld: 78 mg/dL (ref 70–99)
Potassium: 4 mEq/L (ref 3.5–5.1)
Sodium: 138 mEq/L (ref 135–145)
Total Bilirubin: 0.5 mg/dL (ref 0.2–1.2)
Total Protein: 7.9 g/dL (ref 6.0–8.3)

## 2021-08-06 LAB — URINALYSIS, MICROSCOPIC ONLY
RBC / HPF: NONE SEEN (ref 0–?)
WBC, UA: NONE SEEN (ref 0–?)

## 2021-08-06 LAB — HEMOGLOBIN A1C: Hgb A1c MFr Bld: 5.9 % (ref 4.6–6.5)

## 2021-08-06 LAB — LIPID PANEL
Cholesterol: 151 mg/dL (ref 0–200)
HDL: 46.8 mg/dL (ref >39.00)
LDL Cholesterol: 80 mg/dL (ref 0–99)
NonHDL: 104.57
Total CHOL/HDL Ratio: 3
Triglycerides: 123 mg/dL (ref 0.0–149.0)
VLDL: 24.6 mg/dL (ref 0.0–40.0)

## 2021-08-06 LAB — PSA: PSA: 2.12 ng/mL (ref 0.10–4.00)

## 2021-09-24 IMAGING — US US PELVIS LIMITED
1 series · 14 of 19 positions shown · non-contrast
Comparison: None.

CLINICAL DATA: Enlarging right lower quadrant mass

EXAM:
LIMITED ULTRASOUND OF PELVIS
TECHNIQUE: Limited transabdominal ultrasound examination of the pelvis was
performed.

[Series 1: us pelvis limited · 0.17mm/px · 19 acquisitions, 14 frames shown]
[im 1/19]
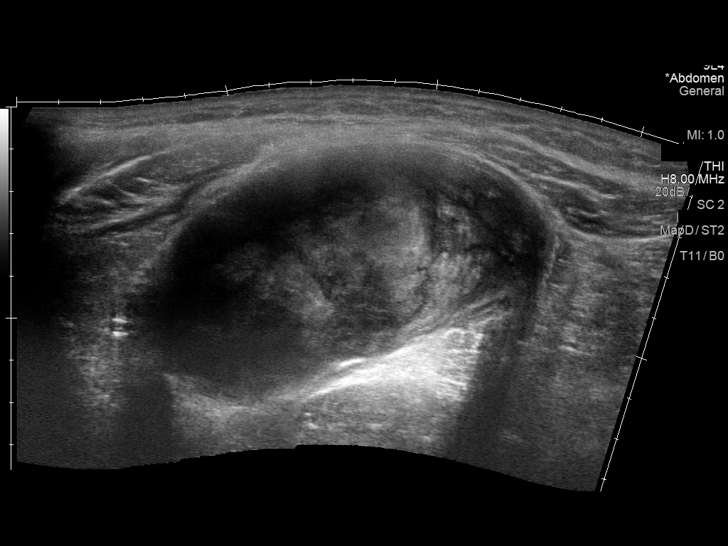
[im 3/19]
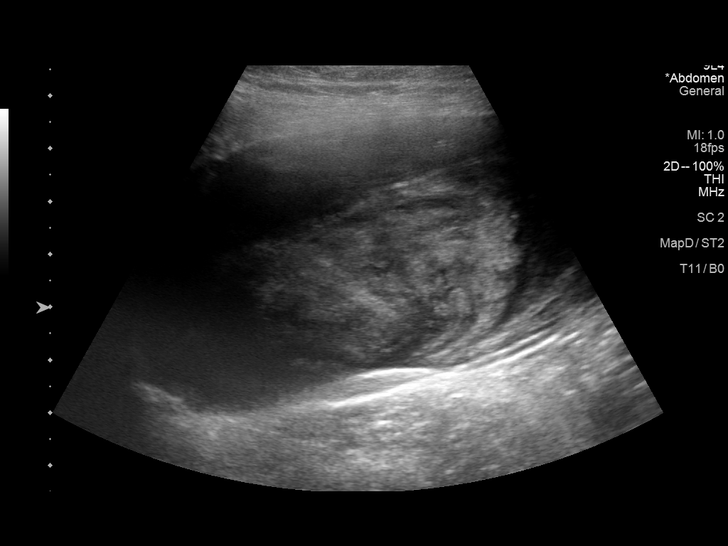
[im 4/19]
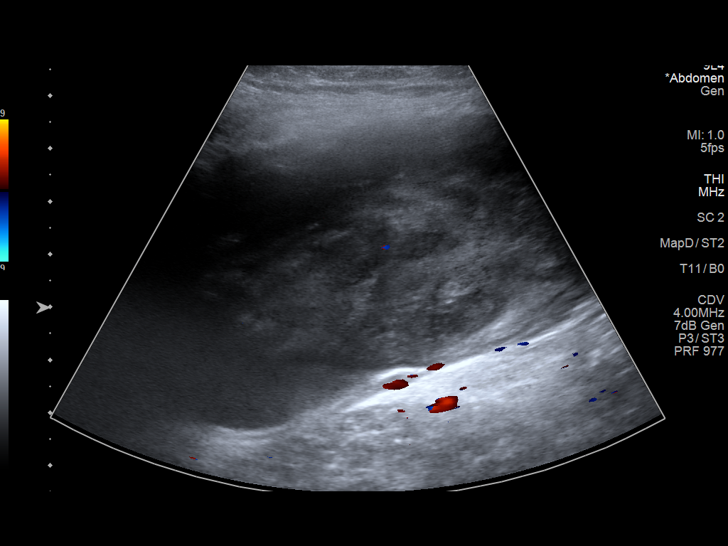
[im 5/19]
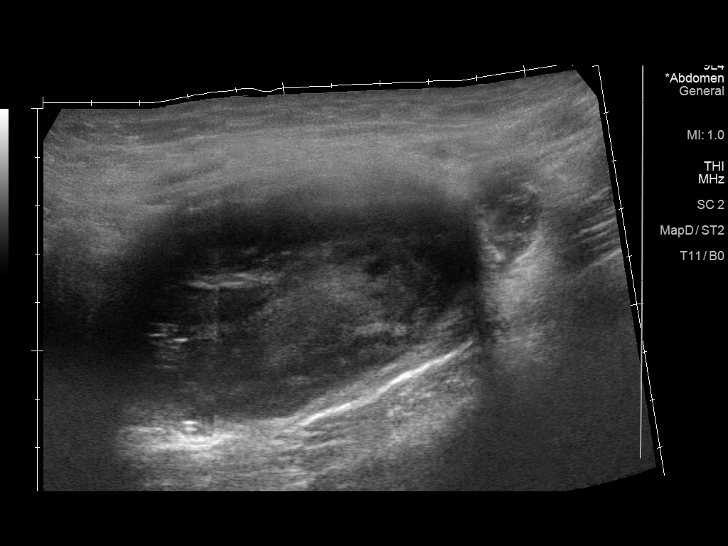
[im 7/19]
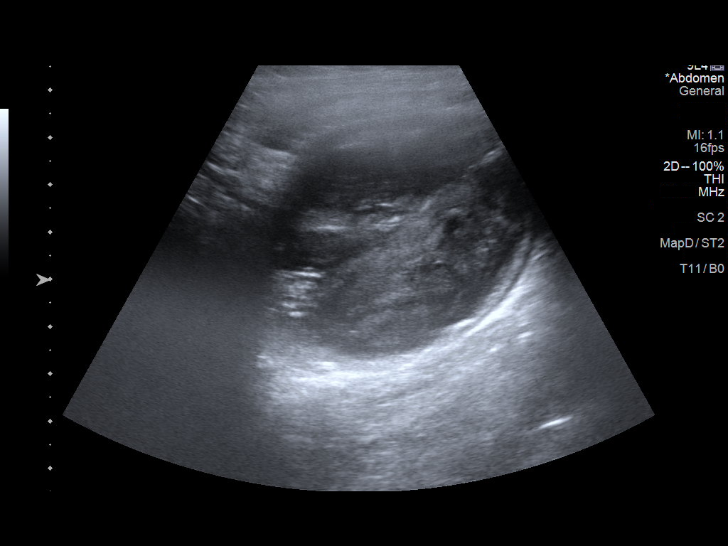
[im 8/19]
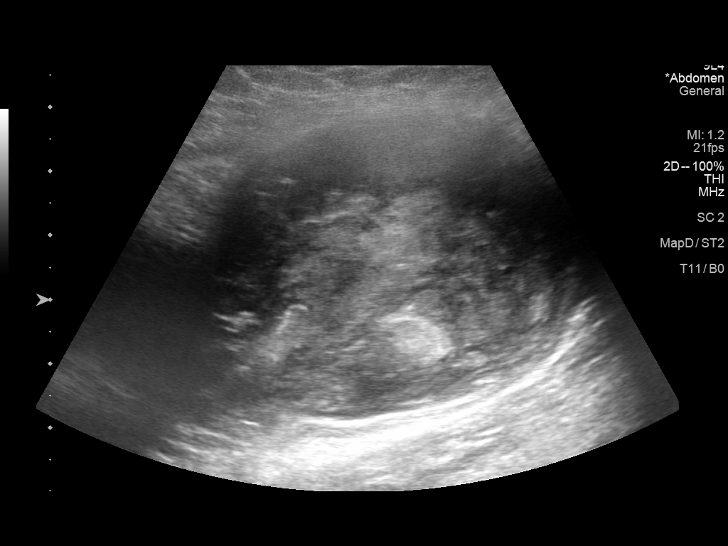
[im 9/19]
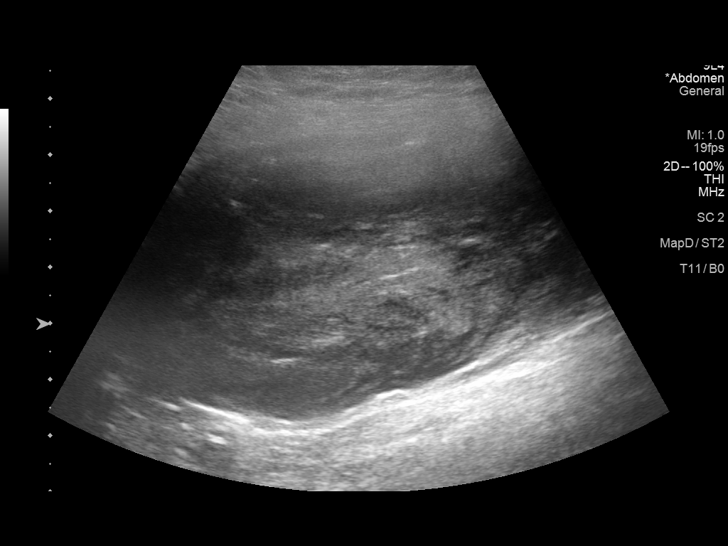
[im 11/19]
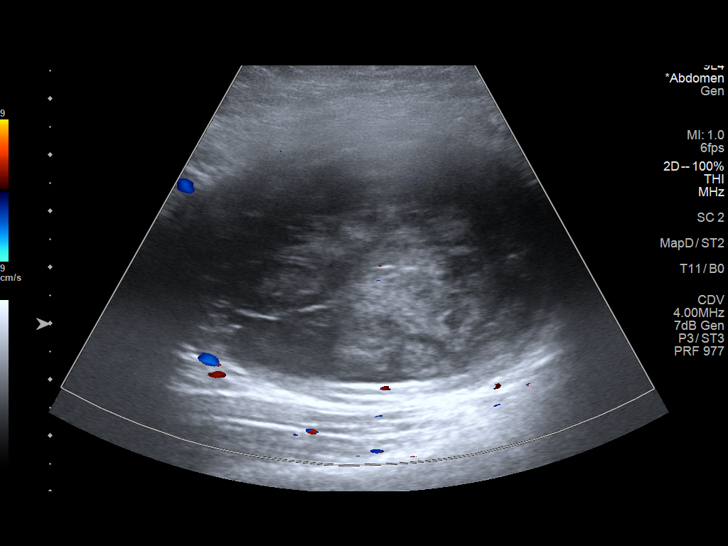
[im 12/19]
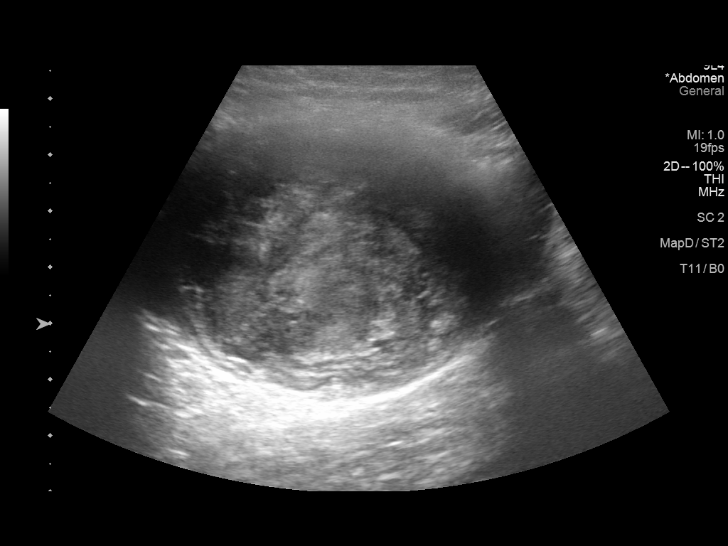
[im 13/19]
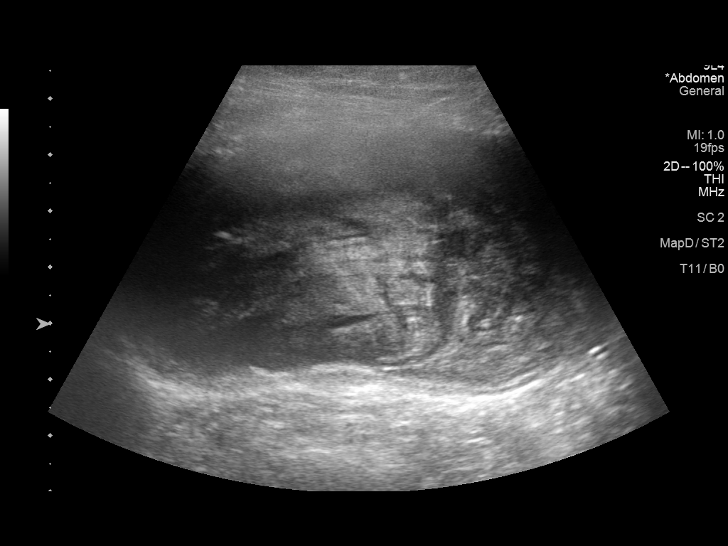
[im 15/19]
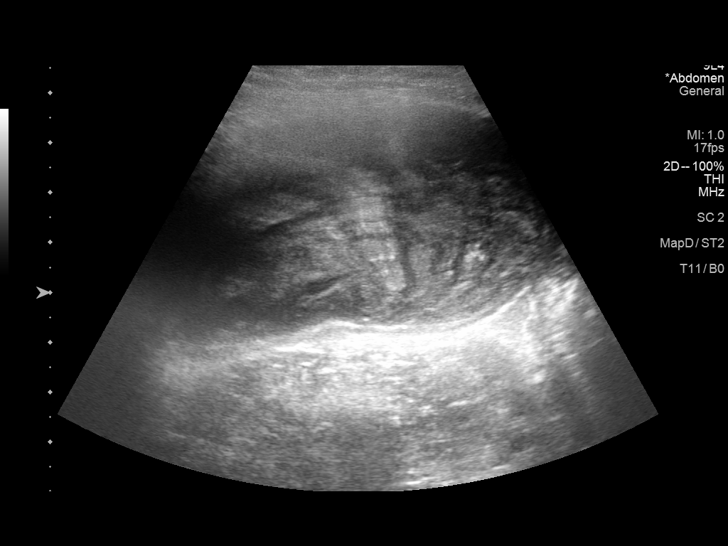
[im 16/19]
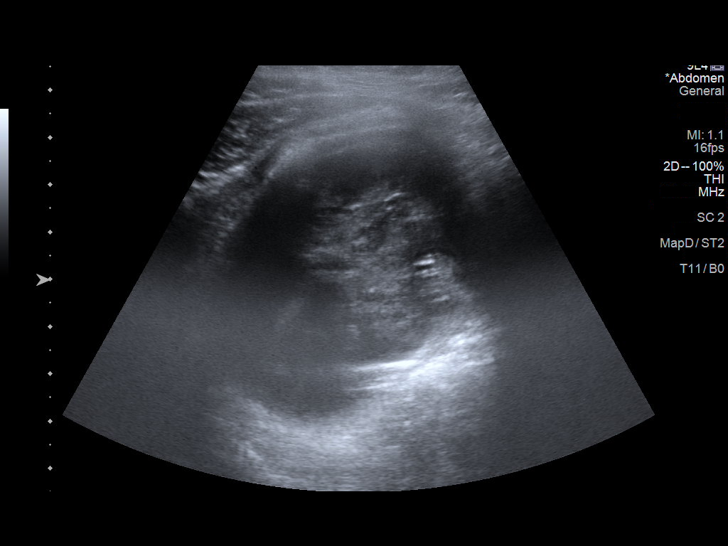
[im 17/19]
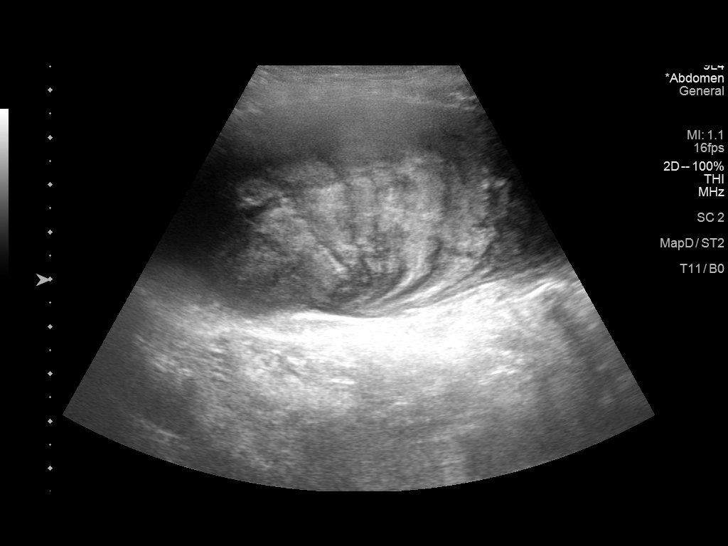
[im 19/19]
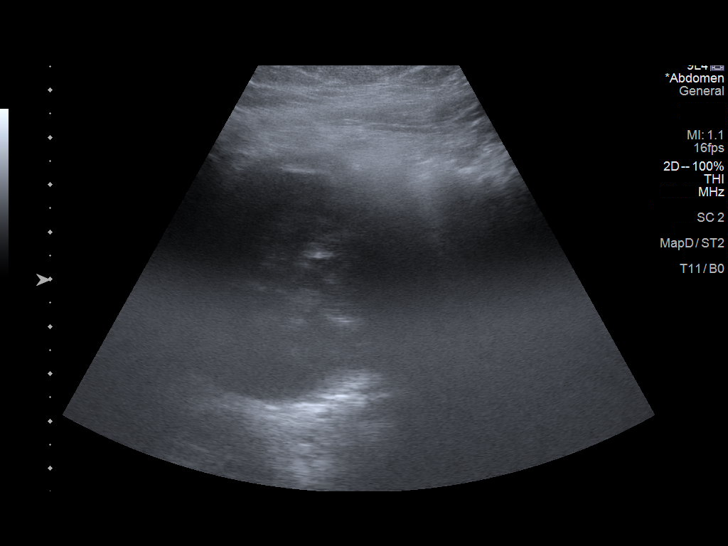

[14 of 19 positions shown; findings below may reference images not displayed]

FINDINGS: Targeted ultrasound of the right lower quadrant is performed in the
region of palpable concern. Corresponding to the abdominal mass is a
large heterogeneous solid-appearing mass measuring 5 x 9.9 x 8 cm.
IMPRESSION: Large solid indeterminate mass in the right lower quadrant
corresponding to area of palpable concern measuring at least 9.9 cm.
CT is recommended for further evaluation.

These results will be called to the ordering clinician or
representative by the Radiologist Assistant, and communication
documented in the PACS or [REDACTED].

## 2021-09-29 IMAGING — CT CT ABD-PELV W/ CM
1 of 3 series · 14 of 32 positions shown, 19 images · IV contrast (APPLIED)
Comparison: None.

CLINICAL DATA: Palpable right lower quadrant mass.

EXAM:
CT ABDOMEN AND PELVIS WITH CONTRAST
TECHNIQUE: Multidetector CT imaging of the abdomen and pelvis was performed
using the standard protocol following bolus administration of
intravenous contrast.
CONTRAST:  100mL [id number] IOPAMIDOL ([id number]) INJECTION 61%

[Series 2: abd/pelvis w/cm · axial · 0.65mm/px · z∈[-478,-58]mm · 14 of 97 slices shown, 19 images]
[im 7/97  soft-tissue]
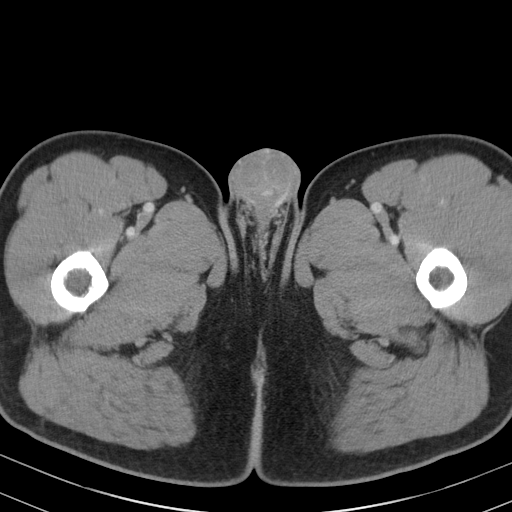
[im 7/97  bone]
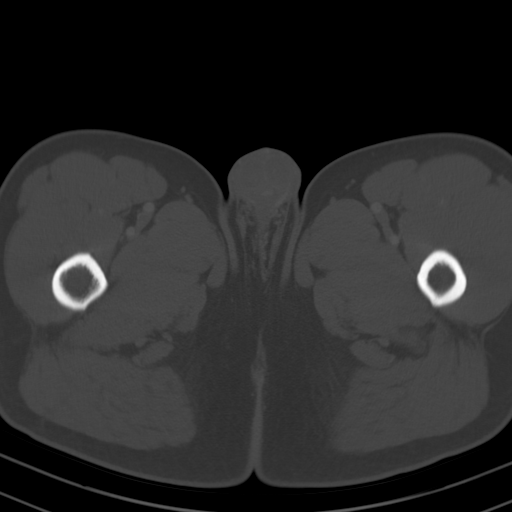
[im 13/97  soft-tissue]
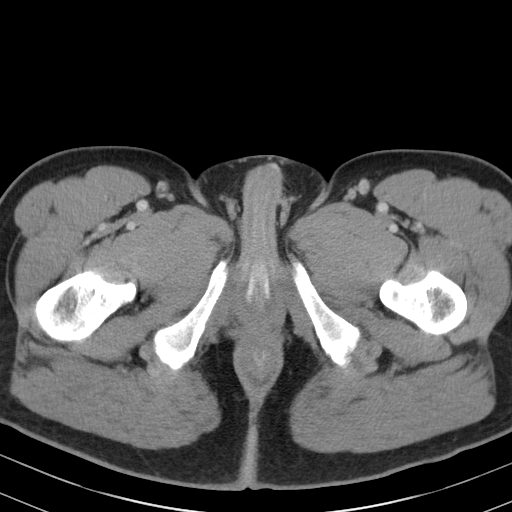
[im 19/97  soft-tissue]
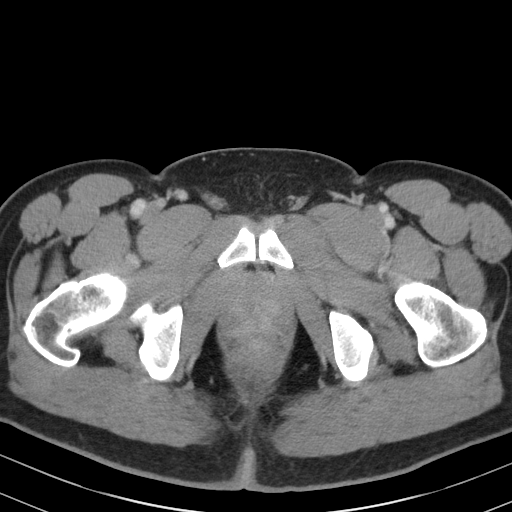
[im 31/97  soft-tissue]
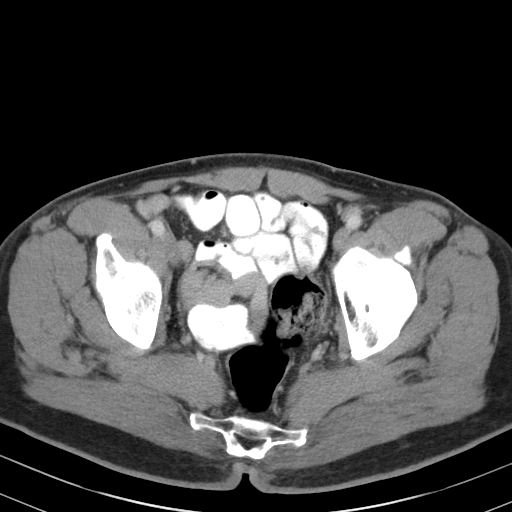
[im 37/97  soft-tissue]
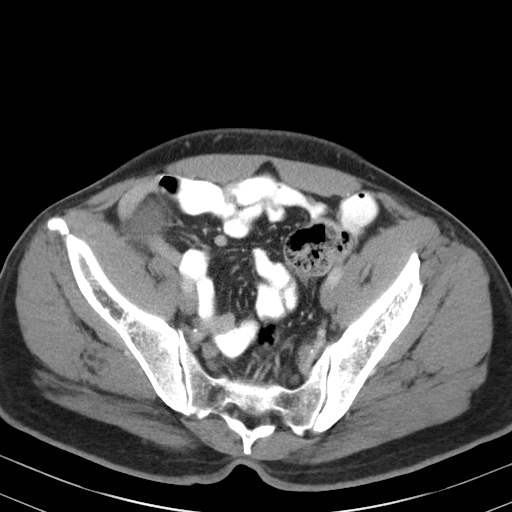
[im 43/97  soft-tissue]
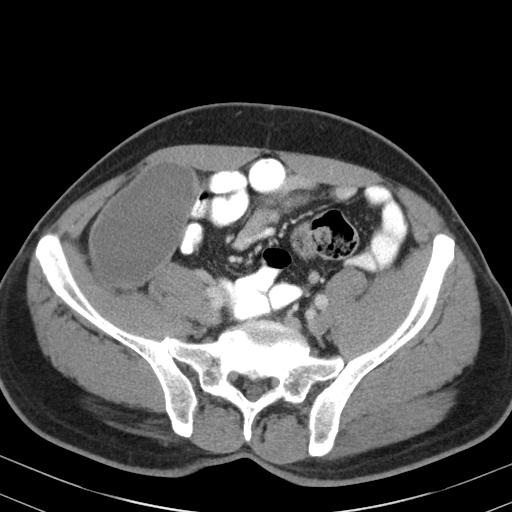
[im 49/97  soft-tissue]
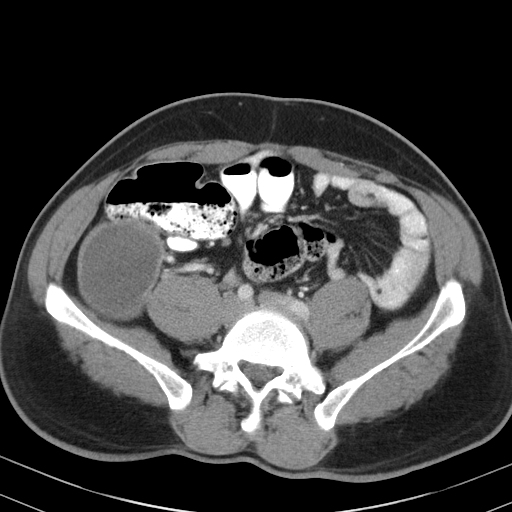
[im 55/97  soft-tissue]
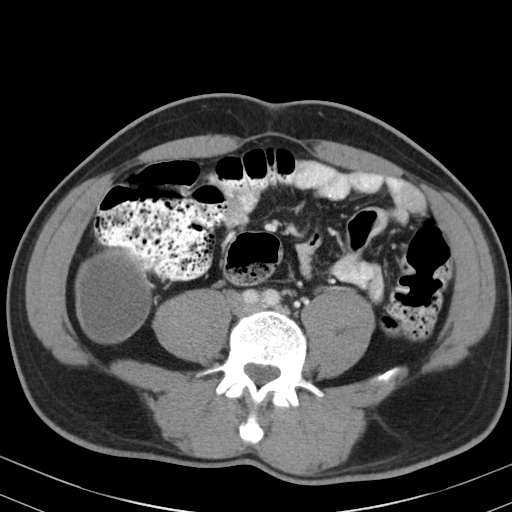
[im 61/97  soft-tissue]
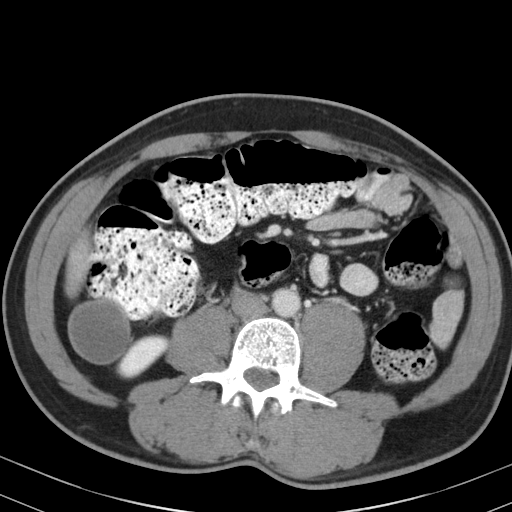
[im 61/97  bone]
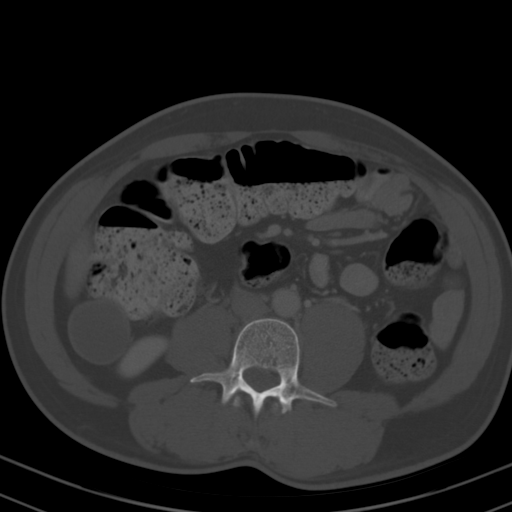
[im 67/97  soft-tissue]
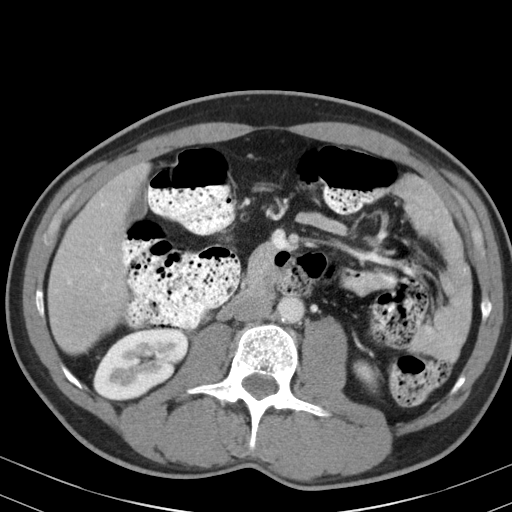
[im 73/97  lung]
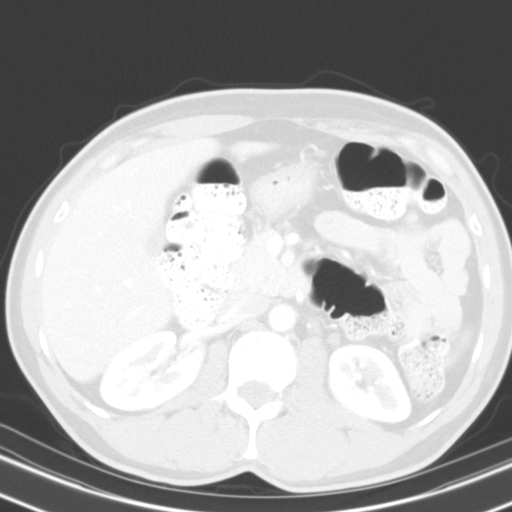
[im 79/97  soft-tissue]
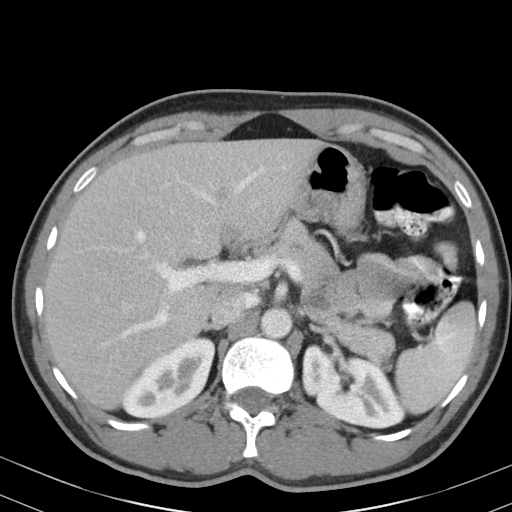
[im 79/97  lung]
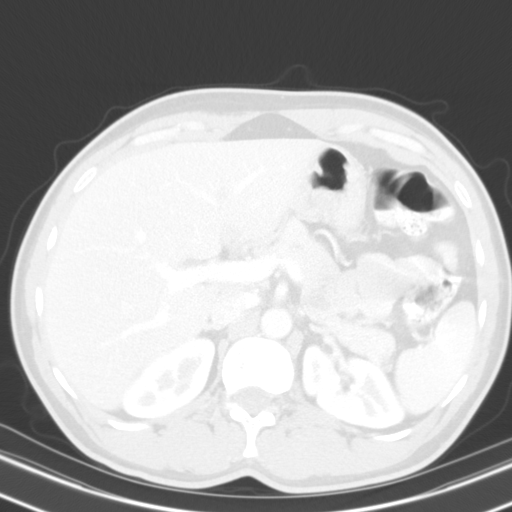
[im 85/97  soft-tissue]
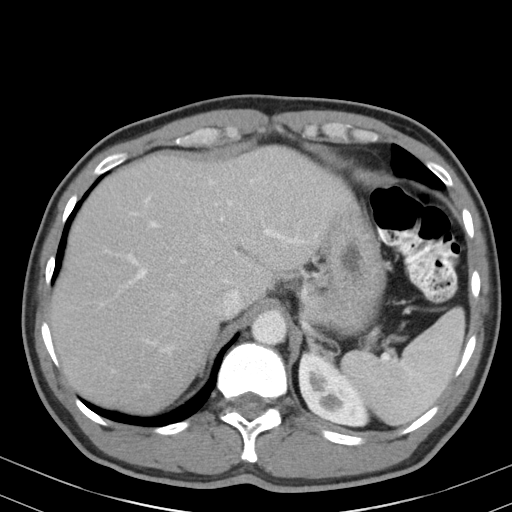
[im 85/97  lung]
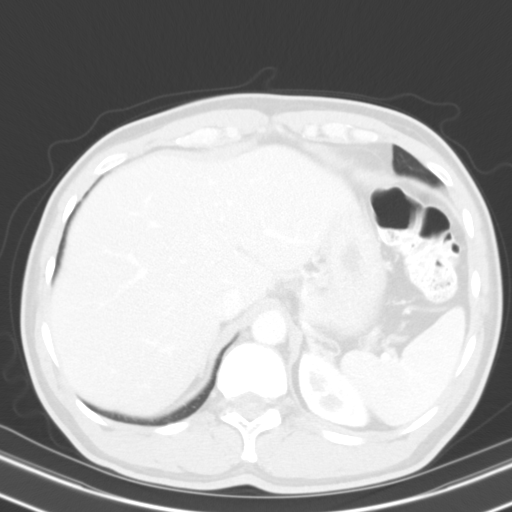
[im 91/97  soft-tissue]
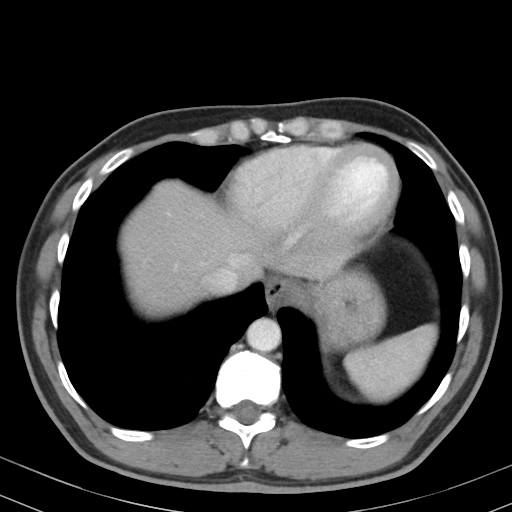
[im 91/97  lung]
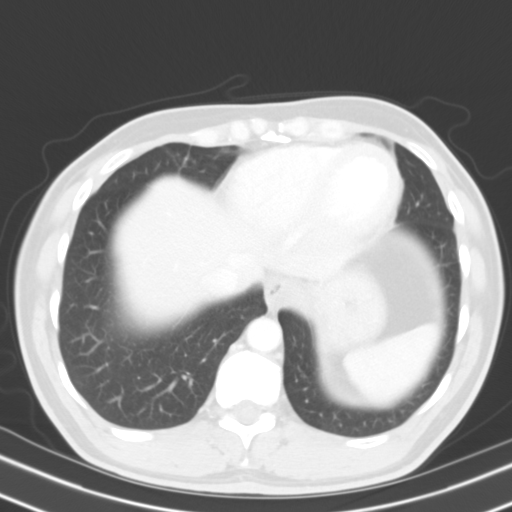

[14 of 32 positions shown; findings below may reference images not displayed]

FINDINGS: Lower Chest: No acute findings.

Hepatobiliary: No hepatic masses identified. Gallbladder is
unremarkable. No evidence of biliary ductal dilatation.

Pancreas:  No mass or inflammatory changes.

Spleen: Within normal limits in size and appearance.

Adrenals/Urinary Tract: No masses identified. No evidence of
ureteral calculi or hydronephrosis.

Stomach/Bowel: No evidence of obstruction, inflammatory process or
abnormal fluid collections. A tubular cystic lesion is seen in the
right paracolic gutter which appears to arise from the cecum. This
shows mild diffuse wall thickening and calcification, and measures
approximately 20 by 6 cm. No evidence of adjacent inflammatory
changes, free fluid, or free air. This is highly suspicious for a
low-grade appendiceal mucinous neoplasm. No evidence of peritoneal
thickening or ascites.

Vascular/Lymphatic: No pathologically enlarged lymph nodes. No
abdominal aortic aneurysm.

Reproductive:  Moderately enlarged prostate.

Other:  None.

Musculoskeletal:  No suspicious bone lesions identified.
IMPRESSION: Large tubular cystic lesion in the right paracolic gutter, highly
suspicious for a low-grade appendiceal mucinous neoplasm. Surgical
consultation is recommended.

No evidence of metastatic disease.

Moderately enlarged prostate.

## 2021-11-14 ENCOUNTER — Other Ambulatory Visit: Payer: Self-pay | Admitting: Family Medicine

## 2021-11-14 DIAGNOSIS — N400 Enlarged prostate without lower urinary tract symptoms: Secondary | ICD-10-CM

## 2021-11-14 DIAGNOSIS — R350 Frequency of micturition: Secondary | ICD-10-CM

## 2021-11-14 DIAGNOSIS — R351 Nocturia: Secondary | ICD-10-CM

## 2021-11-26 ENCOUNTER — Other Ambulatory Visit: Payer: Self-pay | Admitting: Family Medicine

## 2021-11-26 DIAGNOSIS — R350 Frequency of micturition: Secondary | ICD-10-CM

## 2021-11-26 DIAGNOSIS — N400 Enlarged prostate without lower urinary tract symptoms: Secondary | ICD-10-CM

## 2021-11-26 DIAGNOSIS — R351 Nocturia: Secondary | ICD-10-CM

## 2021-11-26 MED ORDER — DOXAZOSIN MESYLATE 4 MG PO TABS: 4.0000 mg | ORAL_TABLET | Freq: Every day | ORAL | 0 refills | Status: DC

## 2021-12-12 ENCOUNTER — Other Ambulatory Visit: Payer: Self-pay | Admitting: Family Medicine

## 2021-12-12 DIAGNOSIS — R351 Nocturia: Secondary | ICD-10-CM

## 2021-12-12 DIAGNOSIS — R350 Frequency of micturition: Secondary | ICD-10-CM

## 2021-12-12 DIAGNOSIS — N400 Enlarged prostate without lower urinary tract symptoms: Secondary | ICD-10-CM

## 2021-12-12 DIAGNOSIS — E119 Type 2 diabetes mellitus without complications: Secondary | ICD-10-CM

## 2021-12-14 ENCOUNTER — Telehealth: Payer: Self-pay | Admitting: Family Medicine

## 2021-12-14 NOTE — Telephone Encounter (Signed)
This patient should have another refill at their pharmacy. It was sent on 08/05/2021 for 3 months with one refill, giving him medication until November.

## 2021-12-14 NOTE — Telephone Encounter (Signed)
Pt stated that pharmacy told to him to called our office for a refill request

## 2021-12-14 NOTE — Telephone Encounter (Signed)
Prescription Refill or Medication Request (non symptomatic) Reason for Call Medication Question / Request Initial Comment Caller states he needs a refill, needs medication. Translation No Nurse Assessment Nurse: Windle Guard, RN, Olin Hauser Date/Time (Eastern Time): 12/12/2021 11:00:52 AM Please select the assessment type ---Refill Additional Documentation ---Metformin '1000mg'$  BID, took last dose last night. Is traveling to [place] tomorrow Does the patient have enough medication to last until the office opens? ---No Additional Documentation ---Per client directive, will call in volume sufficient until office opens (6) . Advised caller to contact office for further direction on Monday as per directive nurse is unable to call for meds after hours. Disp. Time Eilene Ghazi Time) Disposition Final User 12/12/2021 11:09:01 AM Clinical Call Yes Windle Guard, RN, Olin Hauser 12/12/2021 11:09:40 AM Pharmacy Call Windle Guard, RN, Olin Hauser Reason: Nurse may call in refills on maintenance medications: o Volume sufficient until office opens Final Disposition 12/12/2021 11:09:01 AM Clinical Call Yes Conner, RN, Olin Hauser PLEASE NOTE: All timestamps contained within this report are represented as [place] Standard Time. CONFIDENTIALTY NOTICE: This fax transmission is intended only for the addressee. It contains information that is legally privileged, confidential or otherwise protected from use or disclosure. If you are not the intended recipient, you are strictly prohibited from reviewing, disclosing, copying using or disseminating any of this information or taking any action in reliance on or regarding this information. If you have received this fax in error, please notify us immediately by telephone so that we can arrange for its return to [place]. Phone: [phone], Toll-Free: [phone], Fax: [phone] Page: 2 of 3 Call Id: [id number] Verbal Orders/Maintenance Medications Medication Refill Route Dosage Regime Duration Admin  Instructions User Name Metformin '1000mg'$  Yes Oral BID 3 Days 1000 mg po BID Windle Guard, RN, Olin Hauser Comments User: Harvin Hazel, RN Date/Time Eilene Ghazi Time): 12/12/2021 11:09:21 AM Caller denies new or worsening symptoms User: Harvin Hazel, RN Date/Time Eilene Ghazi Time): 12/12/2021 11:10:08 AM [facility] [place], [place], [place] [address] [phone] User: Harvin Hazel, RN Date/Time Eilene Ghazi Time): 12/12/2021 11:16:01 A

## 2021-12-14 NOTE — Telephone Encounter (Signed)
Encourage patient to contact the pharmacy for refills or they can request refills through [facility]  (Please schedule appointment if patient has not been seen in over a year)    WHAT PHARMACY WOULD THEY LIKE THIS SENT TO:   [place] [place] NAME & DOSE: Metformin 1000 mg   NOTES/COMMENTS FROM PATIENT: Pt states he leaving to go to [place]. Pt will turn November 2nd.        [place] office please notify patient: It takes 48-72 hours to process rx refill requests Ask patient to call pharmacy to ensure rx is ready before heading there.

## 2021-12-18 NOTE — Telephone Encounter (Signed)
Called pharmacy and verified that they do have one refill for the metformin and it would be ready in 1 hour. Called patient to let him know, had to leave a vm.

## 2022-02-08 ENCOUNTER — Ambulatory Visit: Payer: Commercial Managed Care - HMO | Admitting: Family Medicine

## 2022-02-24 ENCOUNTER — Other Ambulatory Visit: Payer: Self-pay | Admitting: General Surgery

## 2022-02-24 DIAGNOSIS — R918 Other nonspecific abnormal finding of lung field: Secondary | ICD-10-CM

## 2022-02-26 ENCOUNTER — Ambulatory Visit
Admission: RE | Admit: 2022-02-26 | Discharge: 2022-02-26 | Disposition: A | Discharge status: Home / Self Care | Payer: Commercial Managed Care - HMO | Source: Ambulatory Visit | Attending: General Surgery | Admitting: General Surgery

## 2022-02-26 DIAGNOSIS — R918 Other nonspecific abnormal finding of lung field: Secondary | ICD-10-CM

## 2022-02-26 MED ORDER — IOPAMIDOL (ISOVUE-300) INJECTION 61%
75.0000 mL | Freq: Once | INTRAVENOUS | Status: AC | PRN
  Administered 2022-02-26: 75 mL via INTRAVENOUS

## 2022-03-01 ENCOUNTER — Encounter: Payer: Self-pay | Admitting: Family Medicine

## 2022-03-01 ENCOUNTER — Ambulatory Visit (INDEPENDENT_AMBULATORY_CARE_PROVIDER_SITE_OTHER): Payer: Commercial Managed Care - HMO | Admitting: Family Medicine

## 2022-03-01 VITALS — BP 138/78 | HR 61 | Temp 98.2°F | Ht 76.0 in | Wt 199.0 lb

## 2022-03-01 DIAGNOSIS — E119 Type 2 diabetes mellitus without complications: Secondary | ICD-10-CM | POA: Diagnosis not present

## 2022-03-01 DIAGNOSIS — Z23 Encounter for immunization: Secondary | ICD-10-CM | POA: Diagnosis not present

## 2022-03-01 DIAGNOSIS — E785 Hyperlipidemia, unspecified: Secondary | ICD-10-CM | POA: Diagnosis not present

## 2022-03-01 DIAGNOSIS — N400 Enlarged prostate without lower urinary tract symptoms: Secondary | ICD-10-CM

## 2022-03-01 MED ORDER — ATORVASTATIN CALCIUM 10 MG PO TABS: 10.0000 mg | ORAL_TABLET | Freq: Every day | ORAL | 1 refills | Status: DC

## 2022-03-01 MED ORDER — METFORMIN HCL 1000 MG PO TABS: 1000.0000 mg | ORAL_TABLET | Freq: Two times a day (BID) | ORAL | 0 refills | Status: DC

## 2022-03-01 MED ORDER — DAPAGLIFLOZIN PROPANEDIOL 5 MG PO TABS
5.0000 mg | ORAL_TABLET | Freq: Every day | ORAL | 1 refills | Status: DC
Start: 2022-03-01 — End: 2022-09-15

## 2022-03-01 NOTE — Patient Instructions (Signed)
Congratulations on the new addition to your family.  No medication changes for now.  If any lower blood sugars we may be able to decrease the dose of metformin.  Let me know.  Continue other medicines same dose for now.  If any concerns on labs I will let you know.  Take care.   I do recommend scheduling eye doctor visit and make sure they send me a report for the diabetes.  COVID booster at your pharmacy at your convenience.

## 2022-03-01 NOTE — Progress Notes (Unsigned)
Subjective:  Patient ID: Kevin Evans, male    DOB: 04-26-1970  Age: 51 y.o. MRN: [id number]  CC:  Chief Complaint  Patient presents with   Diabetes    Pt states all is well    HPI Dov Dill presents for  Follow up.   Saw family recently, new son born October 6th - in [place]. [place].  49yo and 49 yo dtr's.   Diabetes: Without complication, well controlled on May labs.  Has been taking metformin 1000 mg twice daily, farxiga 97m QD.  on statin with Lipitor. Home readings fasting:120 or below Home readings postprandial  around 130 or below.  No symptomatic lows. Lowest in 80's.  No GI intolerance or other side effects.   Microalbumin: Normal 11/24/2020 Optho, foot exam, pneumovax: Flu vaccine - today.  Covid booster Optho - will be scheduling.   Lab Results  Component Value Date   HGBA1C 5.9 08/05/2021   HGBA1C 6.8 (H) 11/24/2020   HGBA1C 6.3 (H) 06/09/2020   Lab Results  Component Value Date   MICROALBUR 1.1 11/24/2020   [place] 08/05/2021   CREATININE 1.10 08/05/2021   BPH with LUTS Discussed last visit, higher dose doxazosin 4 mg worked well, [place]  [place] refer him to urologist locally for possible nodule, borderline PSA. Appointment noted September 1 with urology.  Silodosin 8 mg daily in place of doxazosin recommended, 328-monthollow-up for postvoid residual.  Sildenafil as option for erectile dysfunction.  No prostatic nodules were appreciated on their exam.  Symmetric.  3+ size. Had urology eval today. Restarting doxazosin now as retrograde ejaculation on prior med. Planned ultrasound for possible hydrocoele? Possible fluid around testicle.   Will be following up with Dr. ThMarcello Mooresoday - planned repeat imaging - had CT last week - will d/w Dr. ThMarcello Moorest appt today.   Hyperlipidemia: Lipitor 10 mg daily - no new myalgias/side effects.  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 denies at preop   Past Surgical History:  Procedure Laterality Date   APPENDECTOMY  06/20/2020   COLONOSCOPY     LAPAROSCOPIC RIGHT HEMI COLECTOMY N/A 06/20/2020   Procedure: LAPAROSCOPIC RIGHT HEMI COLECTOMY;  Surgeon: ThLeighton RuffMD;  Location: [facility];  Service: General;  Laterality: N/A;   NO PAST SURGERIES     UPPER GASTROINTESTINAL ENDOSCOPY     Allergies  Allergen Reactions   Other Itching and Swelling   Bactrim [Sulfamethoxazole-Trimethoprim] Itching    Itching all over body    Shrimp (Diagnostic)     Under cooked shrimp, facial swelling   Shrimp Extract Allergy Skin Test Other (See Comments)    Under cooked shrimp, facial swelling   Prior to Admission medications  Medication Sig Start Date End Date Taking? Authorizing Provider  alfuzosin (UROXATRAL) 10 MG 24 hr tablet Take 10 mg by mouth at bedtime. 12/09/21  Yes [provider]  atorvastatin (LIPITOR) 10 MG tablet Take 1 tablet (10 mg total) by mouth daily. 08/05/21  Yes Wendie Agreste, MD  blood glucose meter kit and supplies Dispense based on patient and insurance preference.check up to once per day. 01/01/20  Yes Horald Pollen, MD  Blood Glucose Monitoring Suppl w/Device KIT Monitor glucose twice daily 07/04/15  Yes Bush, Benjaman Pott, PA-C  dapagliflozin propanediol (FARXIGA) 5 MG TABS tablet Take 1 tablet (5 mg total)  by mouth daily before breakfast. 08/05/21  Yes Wendie Agreste, MD  metFORMIN (GLUCOPHAGE) 1000 MG tablet TAKE 1 TABLET BY MOUTH TWICE DAILY WITH A MEAL 12/14/21  Yes Wendie Agreste, MD  doxazosin (CARDURA) 1 MG tablet Take 1 tablet by mouth once daily Patient not taking: Reported on 03/01/2022 12/14/21   Wendie Agreste, MD  doxazosin (CARDURA) 4 MG tablet Take 1 tablet (4 mg total) by mouth daily. Patient not taking: Reported on 03/01/2022 11/26/21   Wendie Agreste, MD  Lidocaine, Anorectal, 5 % GEL Apply pea-sized amount to the perianal skin up to 3 times per day as needed. Patient not taking: Reported on 03/01/2022 08/20/20   Wendie Agreste, MD  triamcinolone (KENALOG) 0.1 % Apply 1 application topically 2 (two) times daily. Patient not taking: Reported on 03/01/2022 06/11/20   Wendie Agreste, MD   Social History   Socioeconomic History   Marital status: Single    Spouse name: Not on file   Number of children: 0   Years of education: Not on file   Highest education level: Not on file  Occupational History   Occupation: truck driver  Tobacco Use   Smoking status: Never   Smokeless tobacco: Never  Vaping Use   Vaping Use: Never used  Substance and Sexual Activity   Alcohol use: No    Alcohol/week: 0.0 standard drinks of alcohol   Drug use: No   Sexual activity: Yes    Birth control/protection: Condom  Other Topics Concern   Not on file  Social History Narrative   Not on file   Social Determinants of Health   Financial Resource Strain: Not on file  Food Insecurity: Not on file  Transportation Needs: Not on file  Physical Activity: Not on file  Stress: Not on file  Social Connections: Not on file  Intimate Partner Violence: Not on file    Review of Systems  Constitutional:  Negative for fatigue and unexpected weight change.  Eyes:  Negative for visual disturbance.  Respiratory:  Negative for cough, chest tightness and shortness of breath.   Cardiovascular:   Negative for chest pain, palpitations and leg swelling.  Gastrointestinal:  Negative for abdominal pain and blood in stool.  Neurological:  Negative for dizziness, light-headedness and headaches.     Objective:   Vitals:   03/01/22 1349  BP: 138/78  Pulse: 61  Temp: 98.2 F (36.8 C)  SpO2: 99%  Weight: 199 lb (90.3 kg)  Height: _0  (1.93 m)     Physical Exam Vitals reviewed.  Constitutional:      Appearance: He is well-developed.  HENT:     Head: Normocephalic and atraumatic.  Neck:     Vascular: No carotid bruit or JVD.  Cardiovascular:     Rate and Rhythm: Normal rate and regular rhythm.  Heart sounds: Normal heart sounds. No murmur heard. Pulmonary:     Effort: Pulmonary effort is normal.     Breath sounds: Normal breath sounds. No rales.  Musculoskeletal:     Right lower leg: No edema.     Left lower leg: No edema.  Skin:    General: Skin is warm and dry.  Neurological:     Mental Status: He is alert and oriented to person, place, and time.  Psychiatric:        Mood and Affect: Mood normal.        Assessment & Plan:  Cannan Beeck is a 51 y.o. male . Type 2 diabetes mellitus without complication, without long-term current use of insulin (HCC) - Plan: dapagliflozin propanediol (FARXIGA) 5 MG TABS tablet, metFORMIN (GLUCOPHAGE) 1000 MG tablet, Comprehensive metabolic panel, Lipid panel, Microalbumin / creatinine urine ratio, Hemoglobin A1c  -  Stable, tolerating current regimen. Medications refilled. Labs pending as above.  Very well controlled previously.  Option of lower dose metformin if lower readings, check A1c, no changes for now.  Optho for diabetic screening recommended.  Need for influenza vaccination - Plan: Flu Vaccine QUAD 6+ mos PF IM (Fluarix Quad PF) given today.  Hyperlipidemia, unspecified hyperlipidemia type - Plan: atorvastatin (LIPITOR) 10 MG tablet  - Stable, tolerating current regimen. Medications refilled. Labs pending as above.    Prostate enlargement  -With what sounds like retrograde ejaculation with one of the other medications.  Now going back to doxazosin, has planned follow-up for ultrasound as well as urology follow-up.  Keep follow-up with surgeon including to review CT results from last week.  Meds ordered this encounter  Medications   atorvastatin (LIPITOR) 10 MG tablet    Sig: Take 1 tablet (10 mg total) by mouth daily.    Dispense:  90 tablet    Refill:  1   dapagliflozin propanediol (FARXIGA) 5 MG TABS tablet    Sig: Take 1 tablet (5 mg total) by mouth daily before breakfast.    Dispense:  90 tablet    Refill:  1   metFORMIN (GLUCOPHAGE) 1000 MG tablet    Sig: Take 1 tablet (1,000 mg total) by mouth 2 (two) times daily with a meal.    Dispense:  180 tablet    Refill:  0   Patient Instructions  Congratulations on the new addition to your family.  No medication changes for now.  If any lower blood sugars we may be able to decrease the dose of metformin.  Let me know.  Continue other medicines same dose for now.  If any concerns on labs I will let you know.  Take care.   I do recommend scheduling eye doctor visit and make sure they send me a report for the diabetes.  COVID booster at your pharmacy at your convenience.     Signed,   [name], MD [place] 03/01/22 2:22 PM

## 2022-03-02 LAB — LIPID PANEL
Cholesterol: 148 mg/dL (ref 0–200)
HDL: 42 mg/dL
LDL Cholesterol: 73 mg/dL (ref 0–99)
NonHDL: 105.54
Total CHOL/HDL Ratio: 4
Triglycerides: 165 mg/dL — ABNORMAL HIGH (ref 0.0–149.0)
VLDL: 33 mg/dL (ref 0.0–40.0)

## 2022-03-02 LAB — COMPREHENSIVE METABOLIC PANEL
ALT: 15 U/L (ref 0–53)
AST: 18 U/L (ref 0–37)
Albumin: 4.5 g/dL (ref 3.5–5.2)
Alkaline Phosphatase: 61 U/L (ref 39–117)
BUN: 13 mg/dL (ref 6–23)
CO2: 30 mEq/L (ref 19–32)
Calcium: 9.6 mg/dL (ref 8.4–10.5)
Chloride: 100 mEq/L (ref 96–112)
Creatinine, Ser: 1.1 mg/dL (ref 0.40–1.50)
GFR: 78.77 mL/min (ref >60.00)
Glucose, Bld: 66 mg/dL — ABNORMAL LOW (ref 70–99)
Potassium: 3.9 mEq/L (ref 3.5–5.1)
Sodium: 139 mEq/L (ref 135–145)
Total Bilirubin: 0.4 mg/dL (ref 0.2–1.2)
Total Protein: 7.8 g/dL (ref 6.0–8.3)

## 2022-03-02 LAB — MICROALBUMIN / CREATININE URINE RATIO
Creatinine,U: 75.5 mg/dL
Microalb Creat Ratio: 0.9 mg/g (ref 0.0–30.0)
Microalb, Ur: <0.7 mg/dL (ref 0.0–1.9)

## 2022-03-02 LAB — HEMOGLOBIN A1C: Hgb A1c MFr Bld: 6.7 % — ABNORMAL HIGH (ref 4.6–6.5)

## 2022-04-09 ENCOUNTER — Other Ambulatory Visit: Payer: Self-pay | Admitting: Urology

## 2022-04-27 ENCOUNTER — Other Ambulatory Visit: Payer: Self-pay | Admitting: Family Medicine

## 2022-04-27 DIAGNOSIS — E119 Type 2 diabetes mellitus without complications: Secondary | ICD-10-CM

## 2022-05-11 ENCOUNTER — Ambulatory Visit (HOSPITAL_BASED_OUTPATIENT_CLINIC_OR_DEPARTMENT_OTHER): Admit: 2022-05-11 | Payer: Commercial Managed Care - HMO | Admitting: Urology

## 2022-05-11 ENCOUNTER — Encounter (HOSPITAL_BASED_OUTPATIENT_CLINIC_OR_DEPARTMENT_OTHER): Payer: Self-pay

## 2022-05-11 SURGERY — EXCISION, SPERMATOCELE
Anesthesia: General | Laterality: Right

## 2022-05-25 ENCOUNTER — Encounter: Payer: Self-pay | Admitting: Family Medicine

## 2022-06-02 ENCOUNTER — Encounter: Payer: Self-pay | Admitting: Family Medicine

## 2022-06-18 ENCOUNTER — Other Ambulatory Visit: Payer: Self-pay

## 2022-06-18 ENCOUNTER — Encounter (HOSPITAL_BASED_OUTPATIENT_CLINIC_OR_DEPARTMENT_OTHER): Payer: Self-pay | Admitting: Urology

## 2022-06-18 NOTE — Progress Notes (Signed)
Spoke w/ via phone for pre-op interview---pt Lab needs dos---- I stat and EKG              Lab results------n/a COVID test -----patient states asymptomatic no test needed Arrive at -------1015 NPO after MN NO Solid Food.  Clear liquids from MN until--- Med rec completed Medications to take morning of surgery -----none Diabetic medication -----hold morning of surgery Patient instructed no nail polish to be worn day of surgery Patient instructed to bring photo id and insurance card day of surgery Patient aware to have Driver (ride ) /  friend Kevin Evans caregiver    for 24 hours after surgery  Patient Special Instructions ----- Pre-Op special Istructions -----hold metformin and Farxiga day of surgery Patient verbalized understanding of instructions that were given at this phone interview. Patient denies shortness of breath, chest pain, fever, cough at this phone interview.

## 2022-06-21 ENCOUNTER — Other Ambulatory Visit: Payer: Self-pay | Admitting: Urology

## 2022-06-21 LAB — HM DIABETES EYE EXAM

## 2022-06-21 NOTE — H&P (Signed)
Patient is a 52 year old African-American male seen today for evaluation of BPH. He states he has had history of enlarged prostate and has been managed with Cardura initially starting at 1 mg dose but is worked up to 4 mg dose by his PCP. 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 He continues to have some intermittent dysuria and slow stream. Also some urinary frequency. The patient is diabetic and takes Zytiga which promotes glucosuria which might be part of the cause of frequency. He denies any hematuria. There is no family history of prostate cancer. The patient has had PSA testing recently which returned at 2.12 on 08/05/2021 at PCP office.  Micro urinalysis today was clear on urine spun sediment 3+ glucose  Postvoid residual equals 60 cc   -03/01/22-patient with history of BPH managed currently with silodosin 8 mg daily. Also history of VED recently prescribed sildenafil here for 57-month follow-up. In the interim he has travel back to [place] and is now here for follow-up. He has noted improvement in urinary flow rate. He actually switched over to alfuzosin because he did not like the ejaculatory side effects of the silodosin but I do think silodosin was helping him void easier. 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 No history of GU trauma all right-sided swelling.  Micro urinalysis today clear on urine spun sediment  Postvoid residual is 114 cc   -04/02/22-patiently has BPH managed with alpha-blocker in the past. Recently switch back to silodosin 8 mg daily as he had elevated residual and alfuzosin was not being as effective yet from a urinary standpoint. Also history of recent scrotal enlargement noted on DOT physical and is here for ultrasound to further evaluate. Felt to be  probable hydrocele based on exam but did not transilluminate well.  In the interim from urinary standpoint  Micro urinalysis  Postvoid residual equals:  Scrotal ultrasound reviewed today and shows: Fluid collection adjacent to the right testicle consistent with spermatocele measuring 10.5 x 5.7 x 7.9 cm testicles are normal otherwise. There is a slight thickening inferior to the left testicle but no obvious lesions noted.     ALLERGIES: Bactrim Shrimp Sulfa    MEDICATIONS: Metformin Hcl 1,000 mg tablet  Silodosin 8 mg capsule 1 capsule PO Daily  Alfuzosin Hcl Er 10 mg tablet, extended release 24 hr 1 tablet PO Q HS  Atorvastatin Calcium 10 mg tablet  Doxazosin Mesylate 4 mg tablet  Farxiga 5 mg tablet  Sildenafil Citrate 20 mg tablet Take 1-5 tabs po daily prn ED     GU PSH: No GU PSH    NON-GU PSH: Appendectomy (laparoscopic)     GU PMH: BPH w/LUTS - 03/01/2022, - 11/27/2021 Incomplete bladder emptying - 03/01/2022 Spermatocele (single) - 03/01/2022 Weak Urinary Stream - 03/01/2022, - 11/27/2021 ED due to venous leak - 11/27/2021      PMH Notes: Malignant mass on appendix   NON-GU PMH: Diabetes Type 2 GERD Hypercholesterolemia    FAMILY HISTORY: No Family History    SOCIAL HISTORY: Marital Status: Single Ethnicity: Not Hispanic Or Latino; Race: Black or African American Current Smoking Status: Patient has never smoked.   Tobacco Use Assessment Completed: Used Tobacco in last 30 days? Does not use smokeless tobacco. Has never drank.  Does not use drugs.  Drinks 2 caffeinated drinks per day. Has not had a blood transfusion.    REVIEW OF SYSTEMS:    GU Review Male:   Patient reports frequent urination and get up at night to urinate. Patient denies hard to postpone urination, burning/ pain with urination, leakage of urine, stream starts and stops, trouble starting your stream, have to strain to urinate , erection problems, and penile pain.  Gastrointestinal (Upper):    Patient denies nausea, vomiting, and indigestion/ heartburn.  Gastrointestinal (Lower):   Patient denies diarrhea and constipation.  Constitutional:   Patient denies fever, night sweats, weight loss, and fatigue.  Skin:   Patient denies skin rash/ lesion and itching.  Eyes:   Patient denies blurred vision and double vision.  Ears/ Nose/ Throat:   Patient denies sore throat and sinus problems.  Hematologic/Lymphatic:   Patient denies swollen glands and easy bruising.  Cardiovascular:   Patient denies leg swelling and chest pains.  Respiratory:   Patient denies cough and shortness of breath.  Endocrine:   Patient denies excessive thirst.  Musculoskeletal:   Patient denies back pain and joint pain.  Neurological:   Patient denies headaches and dizziness.  Psychologic:   Patient denies depression and anxiety.   Notes: Pt would like to return to Doxyzosin d/t inability to ejaculate with Silodosin    VITAL SIGNS: None   GU PHYSICAL EXAMINATION:    Testes: Enlargement of right hemiscrotum with mild transillumination  Urethral Meatus: Normal size. No lesion, no wart, no discharge, no polyp. Normal location.   MULTI-SYSTEM PHYSICAL EXAMINATION:    Constitutional: Well-nourished. No physical deformities. Normally developed. Good grooming.  Neck: Neck symmetrical, not swollen. Normal tracheal position.  Respiratory: No labored breathing, no use of accessory muscles.   Cardiovascular: Normal temperature, normal extremity pulses, no swelling, no varicosities.  Lymphatic: No enlargement of neck, axillae, groin.  Skin: No paleness, no jaundice, no cyanosis. No lesion, no ulcer, no rash.  Neurologic / Psychiatric: Oriented to time, oriented to place, oriented to person. No depression, no anxiety, no agitation.  Eyes: Normal conjunctivae. Normal eyelids.  Ears, Nose, Mouth, and Throat: Left ear no scars, no lesions, no masses. Right ear no scars, no lesions, no masses. Nose no scars, no lesions, no  masses. Normal hearing. Normal lips.  Musculoskeletal: Normal gait and station of head and neck.     Complexity of Data:  Source Of History:  Patient  Records Review:   Previous Doctor Records, Previous Patient Records  Urine Test Review:   Urinalysis  X-Ray Review: Scrotal Ultrasound: Reviewed Films. Reviewed Report. Discussed With Patient.     PROCEDURES:         Scrotal Ultrasound - [id number]  Right Testicle: Length: 4.94cm  Height: 2.74cm  Width: 3.05cm  Left Testicle: Length: 4.75cm  Height: 2.71 cm  Width: 2.88 cm  Left Testis/Epididymis:  Testicle WNL, Epididymis WNL, loculated fluid noted in the scrotal sac, there is an area of thickening within the loculated area.   Right Testis/Epididymis:  Testicle WNL, Epididymis WNL, hydrocele 10.55x5.73x7.98cm      . Patient confirmed No Neulasta OnPro Device.     ASSESSMENT:      ICD-10 Details  1 GU:   BPH w/LUTS - N40.1 Chronic, Stable  2   Spermatocele (single) - 99991111 Acute, Complicated Injury  3   Weak Urinary Stream - R39.12 Chronic, Stable   PLAN:           Document Letter(s):  Created for Patient: Clinical Summary  Notes:   From BPH standpoint we will reinitiate alfuzosin 10 mg daily prescription written today.   In regards to probable right spermatocele. We discussed definitive management including right spermatocelectomy. Will schedule accordingly in the near future. Risk and benefits discussed in detail including risk of injury to the testicle, bleeding, infection, anesthetic risk patient desires to proceed

## 2022-06-22 ENCOUNTER — Other Ambulatory Visit: Payer: Self-pay

## 2022-06-22 ENCOUNTER — Ambulatory Visit (HOSPITAL_BASED_OUTPATIENT_CLINIC_OR_DEPARTMENT_OTHER): Payer: Commercial Managed Care - HMO | Admitting: Certified Registered Nurse Anesthetist

## 2022-06-22 ENCOUNTER — Ambulatory Visit (HOSPITAL_BASED_OUTPATIENT_CLINIC_OR_DEPARTMENT_OTHER)
Admission: RE | Admit: 2022-06-22 | Discharge: 2022-06-22 | Disposition: A | Discharge status: Home / Self Care | Payer: Commercial Managed Care - HMO | Attending: Urology | Admitting: Urology

## 2022-06-22 ENCOUNTER — Encounter (HOSPITAL_BASED_OUTPATIENT_CLINIC_OR_DEPARTMENT_OTHER)
Admission: RE | Disposition: A | Discharge status: Home / Self Care | Payer: Self-pay | Source: Home / Self Care | Attending: Urology

## 2022-06-22 ENCOUNTER — Encounter (HOSPITAL_BASED_OUTPATIENT_CLINIC_OR_DEPARTMENT_OTHER): Payer: Self-pay | Admitting: Urology

## 2022-06-22 DIAGNOSIS — K219 Gastro-esophageal reflux disease without esophagitis: Secondary | ICD-10-CM | POA: Insufficient documentation

## 2022-06-22 DIAGNOSIS — Z7984 Long term (current) use of oral hypoglycemic drugs: Secondary | ICD-10-CM

## 2022-06-22 DIAGNOSIS — I10 Essential (primary) hypertension: Secondary | ICD-10-CM | POA: Diagnosis not present

## 2022-06-22 DIAGNOSIS — N4340 Spermatocele of epididymis, unspecified: Secondary | ICD-10-CM

## 2022-06-22 DIAGNOSIS — E119 Type 2 diabetes mellitus without complications: Secondary | ICD-10-CM | POA: Insufficient documentation

## 2022-06-22 DIAGNOSIS — E785 Hyperlipidemia, unspecified: Secondary | ICD-10-CM | POA: Insufficient documentation

## 2022-06-22 DIAGNOSIS — N4341 Spermatocele of epididymis, single: Secondary | ICD-10-CM | POA: Diagnosis present

## 2022-06-22 DIAGNOSIS — N433 Hydrocele, unspecified: Secondary | ICD-10-CM | POA: Diagnosis not present

## 2022-06-22 DIAGNOSIS — Z79899 Other long term (current) drug therapy: Secondary | ICD-10-CM | POA: Diagnosis not present

## 2022-06-22 DIAGNOSIS — E1165 Type 2 diabetes mellitus with hyperglycemia: Secondary | ICD-10-CM

## 2022-06-22 HISTORY — PX: SPERMATOCELECTOMY: SHX2420

## 2022-06-22 LAB — POCT I-STAT, CHEM 8
BUN: 16 mg/dL (ref 6–20)
Calcium, Ion: 1.29 mmol/L (ref 1.15–1.40)
Chloride: 103 mmol/L (ref 98–111)
Creatinine, Ser: 1 mg/dL (ref 0.61–1.24)
Glucose, Bld: 102 mg/dL — ABNORMAL HIGH (ref 70–99)
HCT: 45 % (ref 39.0–52.0)
Hemoglobin: 15.3 g/dL (ref 13.0–17.0)
Potassium: 3.9 mmol/L (ref 3.5–5.1)
Sodium: 141 mmol/L (ref 135–145)
TCO2: 25 mmol/L (ref 22–32)

## 2022-06-22 LAB — GLUCOSE, CAPILLARY: Glucose-Capillary: 114 mg/dL — ABNORMAL HIGH (ref 70–99)

## 2022-06-22 SURGERY — EXCISION, SPERMATOCELE
Anesthesia: General | Site: Scrotum | Laterality: Right

## 2022-06-22 MED ORDER — PROPOFOL 10 MG/ML IV BOLUS
INTRAVENOUS | Status: DC | PRN
  Administered 2022-06-22: 200 mg via INTRAVENOUS

## 2022-06-22 MED ORDER — ONDANSETRON HCL 4 MG/2ML IJ SOLN
INTRAMUSCULAR | Status: AC
  Filled 2022-06-22: qty 2

## 2022-06-22 MED ORDER — GLYCOPYRROLATE 0.2 MG/ML IJ SOLN
INTRAMUSCULAR | Status: DC | PRN
  Administered 2022-06-22 (×2): .1 mg via INTRAVENOUS

## 2022-06-22 MED ORDER — AMISULPRIDE (ANTIEMETIC) 5 MG/2ML IV SOLN: 10.0000 mg | Freq: Once | INTRAVENOUS | Status: DC | PRN

## 2022-06-22 MED ORDER — BUPIVACAINE HCL (PF) 0.25 % IJ SOLN
INTRAMUSCULAR | Status: DC | PRN
  Administered 2022-06-22: 8 mL

## 2022-06-22 MED ORDER — OXYCODONE HCL 5 MG/5ML PO SOLN: 5.0000 mg | Freq: Once | ORAL | Status: AC | PRN

## 2022-06-22 MED ORDER — ONDANSETRON HCL 4 MG/2ML IJ SOLN
INTRAMUSCULAR | Status: DC | PRN
  Administered 2022-06-22: 4 mg via INTRAVENOUS

## 2022-06-22 MED ORDER — TRAMADOL HCL 50 MG PO TABS: 50.0000 mg | ORAL_TABLET | Freq: Four times a day (QID) | ORAL | 0 refills | Status: AC | PRN

## 2022-06-22 MED ORDER — EPHEDRINE SULFATE-NACL 50-0.9 MG/10ML-% IV SOSY
PREFILLED_SYRINGE | INTRAVENOUS | Status: DC | PRN
  Administered 2022-06-22 (×4): 5 mg via INTRAVENOUS

## 2022-06-22 MED ORDER — LACTATED RINGERS IV SOLN: INTRAVENOUS | Status: DC

## 2022-06-22 MED ORDER — MIDAZOLAM HCL 2 MG/2ML IJ SOLN
INTRAMUSCULAR | Status: AC
  Filled 2022-06-22: qty 2

## 2022-06-22 MED ORDER — OXYCODONE HCL 5 MG PO TABS
ORAL_TABLET | ORAL | Status: AC
  Filled 2022-06-22: qty 1

## 2022-06-22 MED ORDER — FENTANYL CITRATE (PF) 100 MCG/2ML IJ SOLN
INTRAMUSCULAR | Status: AC
  Filled 2022-06-22: qty 2

## 2022-06-22 MED ORDER — LIDOCAINE HCL (PF) 2 % IJ SOLN
INTRAMUSCULAR | Status: AC
  Filled 2022-06-22: qty 5

## 2022-06-22 MED ORDER — PHENYLEPHRINE 80 MCG/ML (10ML) SYRINGE FOR IV PUSH (FOR BLOOD PRESSURE SUPPORT)
PREFILLED_SYRINGE | INTRAVENOUS | Status: DC | PRN
  Administered 2022-06-22 (×2): 80 ug via INTRAVENOUS

## 2022-06-22 MED ORDER — HYDROMORPHONE HCL 1 MG/ML IJ SOLN: 0.2500 mg | INTRAMUSCULAR | Status: DC | PRN

## 2022-06-22 MED ORDER — PROMETHAZINE HCL 25 MG/ML IJ SOLN: 6.2500 mg | INTRAMUSCULAR | Status: DC | PRN

## 2022-06-22 MED ORDER — PROPOFOL 10 MG/ML IV BOLUS
INTRAVENOUS | Status: AC
  Filled 2022-06-22: qty 20

## 2022-06-22 MED ORDER — LIDOCAINE 2% (20 MG/ML) 5 ML SYRINGE
INTRAMUSCULAR | Status: DC | PRN
  Administered 2022-06-22: 50 mg via INTRAVENOUS

## 2022-06-22 MED ORDER — MIDAZOLAM HCL 2 MG/2ML IJ SOLN
INTRAMUSCULAR | Status: DC | PRN
  Administered 2022-06-22: 2 mg via INTRAVENOUS

## 2022-06-22 MED ORDER — DEXMEDETOMIDINE HCL IN NACL 80 MCG/20ML IV SOLN
INTRAVENOUS | Status: DC | PRN
  Administered 2022-06-22 (×2): 8 ug via BUCCAL

## 2022-06-22 MED ORDER — CEFAZOLIN SODIUM-DEXTROSE 2-4 GM/100ML-% IV SOLN
2.0000 g | INTRAVENOUS | Status: AC
  Administered 2022-06-22: 2 g via INTRAVENOUS

## 2022-06-22 MED ORDER — CEFAZOLIN SODIUM-DEXTROSE 2-4 GM/100ML-% IV SOLN
INTRAVENOUS | Status: AC
  Filled 2022-06-22: qty 100

## 2022-06-22 MED ORDER — MEPERIDINE HCL 25 MG/ML IJ SOLN: 6.2500 mg | INTRAMUSCULAR | Status: DC | PRN

## 2022-06-22 MED ORDER — OXYCODONE HCL 5 MG PO TABS
5.0000 mg | ORAL_TABLET | Freq: Once | ORAL | Status: AC | PRN
  Administered 2022-06-22: 5 mg via ORAL

## 2022-06-22 MED ORDER — FENTANYL CITRATE (PF) 250 MCG/5ML IJ SOLN
INTRAMUSCULAR | Status: DC | PRN
  Administered 2022-06-22 (×2): 50 ug via INTRAVENOUS

## 2022-06-22 MED ORDER — DEXAMETHASONE SODIUM PHOSPHATE 10 MG/ML IJ SOLN
INTRAMUSCULAR | Status: AC
  Filled 2022-06-22: qty 1

## 2022-06-22 MED ORDER — DEXAMETHASONE SODIUM PHOSPHATE 10 MG/ML IJ SOLN
INTRAMUSCULAR | Status: DC | PRN
  Administered 2022-06-22: 10 mg via INTRAVENOUS

## 2022-06-22 MED ORDER — 0.9 % SODIUM CHLORIDE (POUR BTL) OPTIME
TOPICAL | Status: DC | PRN
  Administered 2022-06-22: 1000 mL

## 2022-06-22 SURGICAL SUPPLY — 55 items
ADH SKN CLS APL DERMABOND .7 (GAUZE/BANDAGES/DRESSINGS)
APL SWBSTK 6 STRL LF DISP (MISCELLANEOUS)
APPLICATOR COTTON TIP 6 STRL (MISCELLANEOUS) IMPLANT
APPLICATOR COTTON TIP 6IN STRL (MISCELLANEOUS)
BAG DRN RND TRDRP ANRFLXCHMBR (UROLOGICAL SUPPLIES)
BAG URINE DRAIN 2000ML AR STRL (UROLOGICAL SUPPLIES) IMPLANT
BLADE CLIPPER SENSICLIP SURGIC (BLADE) IMPLANT
BLADE SURG 15 STRL LF DISP TIS (BLADE) ×1 IMPLANT
BLADE SURG 15 STRL SS (BLADE) ×1
CATH FOLEY 2WAY SLVR  5CC 16FR (CATHETERS)
CATH FOLEY 2WAY SLVR 5CC 16FR (CATHETERS) IMPLANT
COVER BACK TABLE 60X90IN (DRAPES) ×1 IMPLANT
COVER MAYO STAND STRL (DRAPES) ×1 IMPLANT
COVER PROBE U/S 5X48 (MISCELLANEOUS) IMPLANT
DERMABOND ADVANCED .7 DNX12 (GAUZE/BANDAGES/DRESSINGS) IMPLANT
DISSECTOR ROUND CHERRY 3/8 STR (MISCELLANEOUS) IMPLANT
DRAIN PENROSE 0.5X18 (DRAIN) ×1 IMPLANT
DRAPE LAPAROTOMY 100X72 PEDS (DRAPES) ×1 IMPLANT
DRSG TEGADERM 4X4.75 (GAUZE/BANDAGES/DRESSINGS) IMPLANT
ELECT NDL TIP 2.8 STRL (NEEDLE) ×1 IMPLANT
ELECT NEEDLE TIP 2.8 STRL (NEEDLE) ×1 IMPLANT
ELECT REM PT RETURN 9FT ADLT (ELECTROSURGICAL) ×1
ELECTRODE REM PT RTRN 9FT ADLT (ELECTROSURGICAL) ×1 IMPLANT
GAUZE 4X4 16PLY ~~LOC~~+RFID DBL (SPONGE) ×1 IMPLANT
GLOVE BIO SURGEON STRL SZ7.5 (GLOVE) ×1 IMPLANT
KIT TURNOVER CYSTO (KITS) ×1 IMPLANT
MANIFOLD NEPTUNE II (INSTRUMENTS) IMPLANT
NDL HYPO 25X1 1.5 SAFETY (NEEDLE) ×1 IMPLANT
NEEDLE HYPO 25X1 1.5 SAFETY (NEEDLE) ×1 IMPLANT
NS IRRIG 500ML POUR BTL (IV SOLUTION) IMPLANT
PACK BASIN DAY SURGERY FS (CUSTOM PROCEDURE TRAY) ×1 IMPLANT
PENCIL SMOKE EVACUATOR (MISCELLANEOUS) ×1 IMPLANT
SLEEVE SCD COMPRESS KNEE MED (STOCKING) ×1 IMPLANT
SUPPORT SCROTAL LG STRP (MISCELLANEOUS) ×1 IMPLANT
SUT CHROMIC 3 0 SH 27 (SUTURE) IMPLANT
SUT CHROMIC 4 0 PS 2 18 (SUTURE) IMPLANT
SUT CHROMIC 4 0 SH 27 (SUTURE) IMPLANT
SUT CHROMIC GUT AB #0 18 (SUTURE) IMPLANT
SUT MNCRL AB 4-0 PS2 18 (SUTURE) IMPLANT
SUT PROLENE 4 0 RB 1 (SUTURE)
SUT PROLENE 4-0 RB1 .5 CRCL 36 (SUTURE) IMPLANT
SUT SILK 0 SH 30 (SUTURE) IMPLANT
SUT SILK 0 TIES 10X30 (SUTURE) IMPLANT
SUT VIC AB 0 SH 27 (SUTURE) IMPLANT
SUT VIC AB 2-0 SH 27 (SUTURE)
SUT VIC AB 2-0 SH 27XBRD (SUTURE) IMPLANT
SUT VICRYL 2 0 18  UND BR (SUTURE)
SUT VICRYL 2 0 18 UND BR (SUTURE) IMPLANT
SYR 30ML LL (SYRINGE) IMPLANT
SYR CONTROL 10ML LL (SYRINGE) ×1 IMPLANT
TOWEL OR 17X24 6PK STRL BLUE (TOWEL DISPOSABLE) ×2 IMPLANT
TRAY DSU PREP LF (CUSTOM PROCEDURE TRAY) ×1 IMPLANT
TUBE CONNECTING 12X1/4 (SUCTIONS) IMPLANT
WATER STERILE IRR 500ML POUR (IV SOLUTION) IMPLANT
YANKAUER SUCT BULB TIP NO VENT (SUCTIONS) IMPLANT

## 2022-06-22 NOTE — Anesthesia Procedure Notes (Signed)
Procedure Name: LMA Insertion Date/Time: 06/22/2022 12:51 PM  Performed by: Clearnce Sorrel, CRNAPre-anesthesia Checklist: Patient identified, Emergency Drugs available, Suction available and Patient being monitored Patient Re-evaluated:Patient Re-evaluated prior to induction Oxygen Delivery Method: Circle System Utilized Preoxygenation: Pre-oxygenation with 100% oxygen Induction Type: IV induction Ventilation: Mask ventilation without difficulty LMA: LMA inserted LMA Size: 5.0 Number of attempts: 1 Airway Equipment and Method: Bite block Placement Confirmation: positive ETCO2 Tube secured with: Tape Dental Injury: Teeth and Oropharynx as per pre-operative assessment

## 2022-06-22 NOTE — Transfer of Care (Signed)
Immediate Anesthesia Transfer of Care Note  Patient: Kevin Evans  Procedure(s) Performed: HYDROCELECTOMY (Right: Scrotum)  Patient Location: PACU  Anesthesia Type:General  Level of Consciousness: drowsy and patient cooperative  Airway & Oxygen Therapy: Patient Spontanous Breathing  Post-op Assessment: Report given to RN and Post -op Vital signs reviewed and stable  Post vital signs: Reviewed and stable  Last Vitals:  Vitals Value Taken Time  BP    Temp    Pulse    Resp    SpO2      Last Pain:  Vitals:   06/22/22 1032  TempSrc: Oral  PainSc: 0-No pain      Patients Stated Pain Goal: 6 ([id number])  Complications: No notable events documented.

## 2022-06-22 NOTE — Anesthesia Preprocedure Evaluation (Signed)
Anesthesia Evaluation  Patient identified by MRN, date of birth, ID band Patient awake    Reviewed: Allergy & Precautions, NPO status , Patient's Chart, lab work & pertinent test results  Airway Mallampati: III  TM Distance: >3 FB Neck ROM: Full    Dental  (+) Teeth Intact, Dental Advisory Given,    Pulmonary neg pulmonary ROS   Pulmonary exam normal breath sounds clear to auscultation       Cardiovascular hypertension, Pt. on medications Normal cardiovascular exam Rhythm:Regular Rate:Normal     Neuro/Psych negative neurological ROS  negative psych ROS   GI/Hepatic Neg liver ROS,GERD  Controlled,,Appendiceal tumor   Endo/Other  diabetes, Well Controlled, Type 2, Oral Hypoglycemic Agents  a1c 6.3  Renal/GU negative Renal ROS  negative genitourinary   Musculoskeletal negative musculoskeletal ROS (+)    Abdominal   Peds  Hematology negative hematology ROS (+) hct 46.3, plt 234   Anesthesia Other Findings   Reproductive/Obstetrics negative OB ROS                             Anesthesia Physical Anesthesia Plan  ASA: 3  Anesthesia Plan: General   Post-op Pain Management: Minimal or no pain anticipated   Induction: Intravenous  PONV Risk Score and Plan: 2 and Ondansetron, Midazolam and Treatment may vary due to age or medical condition  Airway Management Planned: LMA  Additional Equipment: None  Intra-op Plan:   Post-operative Plan: Extubation in OR  Informed Consent: I have reviewed the patients History and Physical, chart, labs and discussed the procedure including the risks, benefits and alternatives for the proposed anesthesia with the patient or authorized representative who has indicated his/her understanding and acceptance.     Dental advisory given  Plan Discussed with: CRNA  Anesthesia Plan Comments:         Anesthesia Quick Evaluation

## 2022-06-22 NOTE — Discharge Instructions (Signed)

## 2022-06-22 NOTE — Interval H&P Note (Signed)
History and Physical Interval Note:  06/22/2022 11:32 AM  Kevin Evans  has presented today for surgery, with the diagnosis of SPERMATOCELE.  The various methods of treatment have been discussed with the patient and family. After consideration of risks, benefits and other options for treatment, the patient has consented to  Procedure(s): SPERMATOCELECTOMY (Right) as a surgical intervention.  The patient's history has been reviewed, patient examined, no change in status, stable for surgery.  I have reviewed the patient's chart and labs.  Questions were answered to the patient's satisfaction.     Remi Haggard

## 2022-06-22 NOTE — Interval H&P Note (Signed)
History and Physical Interval Note:  06/22/2022 12:45 PM  Kevin Evans  has presented today for surgery, with the diagnosis of SPERMATOCELE.  The various methods of treatment have been discussed with the patient and family. After consideration of risks, benefits and other options for treatment, the patient has consented to  Procedure(s): SPERMATOCELECTOMY (Right) as a surgical intervention.  The patient's history has been reviewed, patient examined, no change in status, stable for surgery.  I have reviewed the patient's chart and labs.  Questions were answered to the patient's satisfaction.     Remi Haggard

## 2022-06-22 NOTE — Op Note (Signed)
Preoperative diagnosis:  1.  Right spermatocele  Postoperative diagnosis: 1.  Right hydrocele  Procedure(s): 1.  Right hydrocelectomy  Surgeon: Dr. Harold Barban  Anesthesia: General  Complications: None  EBL: Minimal  Specimens: Hydrocele sac  Disposition of specimens: To pathology  Intraoperative findings: Right sided hydrocele approximately 300 cc, sac excised and oversewn posterior to the testicle  Indication: Patient is a 52 year old African-American male had gradually enlarging fluid collection in the right scrotum which is consistent with either spermatocele or hydrocele.  Presents this time undergo scrotal aspiration and right spermatocele ectomy or hydrocelectomy.  Description of procedure:  After obtaining for consent for the patient was taken the major OR suite placed under general anesthesia.  Placed in the supine position his genitalia prepped and draped in usual sterile fashion.  Proper pause and timeout was performed for site of procedure.  Approximate 3 cm transverse incision was made over the right hemiscrotum after tenting up the fluid collection to the anterior scrotal wall.  The dartos muscle was divided down to the tunica vaginalis.  Finger was then utilized to create a dorsal was passed around the outside of the tunica vaginalis.  Testicle was then expressed out through the incision and it was obvious that this was a hydrocele and not a spermatocele.  Hydrocele sac was subsequently opened and drained approximately 300 cc of clear hydrocele fluid.  The sac was then opened and folded posterior to the testicle excess sac was excised.  The cut edges were Bovie cauterized and then oversewn posterior to the testicle utilizing a running 3-0 chromic suture.  Half percent plain Marcaine cord block was then performed for postoperative anesthesia and the testicle was returned to the dartos pouch.  Good hemostasis was noted within the pouch.  Dartos muscle was then closed with  running 3-0 chromic suture along the length of the incision.  The skin edges were closed with interrupted 3-0 chromic sutures in vertical mattress fashion.  Antibiotic ointment Telfa and fluff dressing were then placed with scrotal support.  Procedure was terminated he was awake from anesthesia and taken back to the recovery room in stable condition no immediate complication from the procedure.  Estimated blood loss minimal sponge and needle counts correct at termination of the case.

## 2022-06-23 ENCOUNTER — Encounter (HOSPITAL_BASED_OUTPATIENT_CLINIC_OR_DEPARTMENT_OTHER): Payer: Self-pay | Admitting: Urology

## 2022-06-23 LAB — SURGICAL PATHOLOGY

## 2022-06-23 NOTE — Anesthesia Postprocedure Evaluation (Signed)
Anesthesia Post Note  Patient: Kevin Evans  Procedure(s) Performed: HYDROCELECTOMY (Right: Scrotum)     Patient location during evaluation: PACU Anesthesia Type: General Level of consciousness: awake and alert Pain management: pain level controlled Vital Signs Assessment: post-procedure vital signs reviewed and stable Respiratory status: spontaneous breathing, nonlabored ventilation and respiratory function stable Cardiovascular status: blood pressure returned to baseline and stable Postop Assessment: no apparent nausea or vomiting Anesthetic complications: no   No notable events documented.  Last Vitals:  Vitals:   06/22/22 1430 06/22/22 1529  BP: 128/76 134/81  Pulse: (!) 50 63  Resp: 11 12  Temp:    SpO2: 100% 100%    Last Pain:  Vitals:   06/22/22 1529  TempSrc:   PainSc: 7    Pain Goal: Patients Stated Pain Goal: 7 ("7-8") (06/22/22 1415)                 Lynda Rainwater

## 2022-08-30 ENCOUNTER — Ambulatory Visit: Payer: Commercial Managed Care - HMO | Admitting: Family Medicine

## 2022-09-15 ENCOUNTER — Other Ambulatory Visit: Payer: Self-pay | Admitting: Family Medicine

## 2022-09-15 DIAGNOSIS — E119 Type 2 diabetes mellitus without complications: Secondary | ICD-10-CM

## 2022-10-18 ENCOUNTER — Ambulatory Visit (INDEPENDENT_AMBULATORY_CARE_PROVIDER_SITE_OTHER): Payer: Commercial Managed Care - HMO | Admitting: Family Medicine

## 2022-10-18 ENCOUNTER — Encounter: Payer: Self-pay | Admitting: Family Medicine

## 2022-10-18 VITALS — BP 116/72 | HR 70 | Temp 98.0°F | Ht 76.0 in | Wt 183.2 lb

## 2022-10-18 DIAGNOSIS — E119 Type 2 diabetes mellitus without complications: Secondary | ICD-10-CM

## 2022-10-18 DIAGNOSIS — Z23 Encounter for immunization: Secondary | ICD-10-CM

## 2022-10-18 DIAGNOSIS — Z7984 Long term (current) use of oral hypoglycemic drugs: Secondary | ICD-10-CM

## 2022-10-18 DIAGNOSIS — Z1159 Encounter for screening for other viral diseases: Secondary | ICD-10-CM

## 2022-10-18 DIAGNOSIS — E785 Hyperlipidemia, unspecified: Secondary | ICD-10-CM | POA: Diagnosis not present

## 2022-10-18 DIAGNOSIS — R351 Nocturia: Secondary | ICD-10-CM

## 2022-10-18 LAB — LIPID PANEL
Cholesterol: 151 mg/dL (ref 0–200)
HDL: 48.3 mg/dL (ref >39.00)
LDL Cholesterol: 89 mg/dL (ref 0–99)
NonHDL: 102.64
Total CHOL/HDL Ratio: 3
Triglycerides: 66 mg/dL (ref 0.0–149.0)
VLDL: 13.2 mg/dL (ref 0.0–40.0)

## 2022-10-18 LAB — COMPREHENSIVE METABOLIC PANEL
ALT: 15 U/L (ref 0–53)
AST: 17 U/L (ref 0–37)
Albumin: 4.7 g/dL (ref 3.5–5.2)
Alkaline Phosphatase: 49 U/L (ref 39–117)
BUN: 17 mg/dL (ref 6–23)
CO2: 29 mEq/L (ref 19–32)
Calcium: 10.1 mg/dL (ref 8.4–10.5)
Chloride: 102 mEq/L (ref 96–112)
Creatinine, Ser: 1.08 mg/dL (ref 0.40–1.50)
GFR: 80.17 mL/min (ref >60.00)
Glucose, Bld: 101 mg/dL — ABNORMAL HIGH (ref 70–99)
Potassium: 4 mEq/L (ref 3.5–5.1)
Sodium: 139 mEq/L (ref 135–145)
Total Bilirubin: 0.5 mg/dL (ref 0.2–1.2)
Total Protein: 8.1 g/dL (ref 6.0–8.3)

## 2022-10-18 LAB — HEMOGLOBIN A1C: Hgb A1c MFr Bld: 6 % (ref 4.6–6.5)

## 2022-10-18 MED ORDER — ATORVASTATIN CALCIUM 10 MG PO TABS: 10.0000 mg | ORAL_TABLET | Freq: Every day | ORAL | 1 refills | Status: DC

## 2022-10-18 MED ORDER — METFORMIN HCL 1000 MG PO TABS: 1000.0000 mg | ORAL_TABLET | Freq: Two times a day (BID) | ORAL | 1 refills | Status: DC

## 2022-10-18 MED ORDER — FARXIGA 5 MG PO TABS: 5.0000 mg | ORAL_TABLET | Freq: Every day | ORAL | 1 refills | Status: DC

## 2022-10-18 NOTE — Patient Instructions (Signed)
Thank you for coming in today.  No medication changes at this time.  Keep follow-up with urology as planned.  If any concerns on labs I will let you know.  First shingles vaccine given today, second one can be repeated in 2 to 6 months.  COVID booster should be available at the pharmacy with a new formulation in the next few months, I recommend that with the flu vaccine.  Take care!

## 2022-10-18 NOTE — Progress Notes (Signed)
Subjective:  Patient ID: Kevin Evans, male    DOB: Feb 21, 1971  Age: 52 y.o. MRN: [id number]  CC:  Chief Complaint  Patient presents with   Medical Management of Chronic Issues    Pt doing well, no concerns    Health Maintenance    Discuss shingles vaccine     HPI Kevin Evans presents for   Diabetes: Without complication overall well-controlled with slight increased A1c in December from last May readings.  Takes metformin 1000 mg twice daily as well as Farxiga 5 mg daily and is on statin with Lipitor. Home readings fasting: 111 today.  Postprandial: 125 or below.  No symptomatic lows.  Lowest in 80's  no new side effects or GI intolerance with medications. Microalbumin: Normal ratio 03/01/2022. Optho, foot exam, pneumovax: Up-to-date  Lab Results  Component Value Date   HGBA1C 6.7 (H) 03/01/2022   HGBA1C 5.9 08/05/2021   HGBA1C 6.8 (H) 11/24/2020   Lab Results  Component Value Date   MICROALBUR <0.7 03/01/2022   LDLCALC 73 03/01/2022   CREATININE 1.00 06/22/2022   BPH with LUTS See prior notes, followed by urology, retrograde ejaculation with silodosin, returned to doxazosin, now on alfuzosin.  Followed by urology with possible hydrocele.Appointment noted from April with urology, postop at that time from recent hydrocelectomy.  59-month follow-up planned, and continued on sildenafil as needed. Repeat urology appt planned today. Not using sildenafil.   Hyperlipidemia: Lipitor 10 mg daily, no new myalgias/side effects.  Lab Results  Component Value Date   CHOL 148 03/01/2022   HDL 42.00 03/01/2022   LDLCALC 73 03/01/2022   TRIG 165.0 (H) 03/01/2022   CHOLHDL 4 03/01/2022   Lab Results  Component Value Date   ALT 15 03/01/2022   AST 18 03/01/2022   ALKPHOS 61 03/01/2022   BILITOT 0.4 03/01/2022    HM:  Shingles vaccine today.  Covid booster - recommended. Had one at [facility] as well.  Hep c screen today.  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  06/18/2022   HLD (hyperlipidemia)    Hyperlipidemia    Phreesia 06/10/2020   Hypertension    No meds pt. denies at preop   Past Surgical History:  Procedure Laterality Date   APPENDECTOMY  06/20/2020   COLONOSCOPY     LAPAROSCOPIC RIGHT HEMI COLECTOMY N/A 06/20/2020   Procedure: LAPAROSCOPIC RIGHT HEMI COLECTOMY;  Surgeon: Romie Levee, MD;  Location: [facility];  Service: General;  Laterality: N/A;   NO PAST SURGERIES     SPERMATOCELECTOMY Right 06/22/2022   Procedure: HYDROCELECTOMY;  Surgeon: Belva Agee, MD;  Location: [facility];  Service: Urology;  Laterality: Right;   UPPER GASTROINTESTINAL ENDOSCOPY     Allergies  Allergen Reactions   Other Itching and Swelling   Bactrim [Sulfamethoxazole-Trimethoprim]  Itching    Itching all over body    Shrimp (Diagnostic)     Under cooked shrimp, facial swelling   Shrimp Extract Other (See Comments)    Under cooked shrimp, facial swelling   Prior to Admission medications   Medication Sig Start Date End Date Taking? Authorizing Provider  alfuzosin (UROXATRAL) 10 MG 24 hr tablet Take 10 mg by mouth at bedtime. 12/09/21  Yes [provider]   atorvastatin (LIPITOR) 10 MG tablet Take 1 tablet (10 mg total) by mouth daily. 03/01/22  Yes Shade Flood, MD  blood glucose meter kit and supplies Dispense based on patient and insurance preference.check up to once per day. 01/01/20  Yes Georgina Quint, MD  Blood Glucose Monitoring Suppl w/Device KIT Monitor glucose twice daily 07/04/15  Yes Bush, [place] [employer], PA-C  FARXIGA 5 MG TABS tablet TAKE 1 TABLET BY MOUTH ONCE DAILY BEFORE BREAKFAST 09/15/22  Yes Shade Flood, MD  metFORMIN (GLUCOPHAGE) 1000 MG tablet TAKE 1 TABLET BY MOUTH TWICE DAILY WITH A MEAL 04/27/22  Yes Shade Flood, MD  traMADol (ULTRAM) 50 MG tablet Take 1 tablet (50 mg total) by mouth every 6 (six) hours as needed. 06/22/22 06/22/23 Yes Belva Agee, MD   Social History   Socioeconomic History   Marital status: Single    Spouse name: Not on file   Number of children: 0   Years of education: Not on file   Highest education level: Master's degree (e.g., MA, MS, MEng, MEd, MSW, MBA)  Occupational History   Occupation: truck driver  Tobacco Use   Smoking status: Never   Smokeless tobacco: Never  Vaping Use   Vaping status: Never Used  Substance and Sexual Activity   Alcohol use: No    Alcohol/week: 0.0 standard drinks of alcohol   Drug use: No   Sexual activity: Yes    Birth control/protection: Condom  Other Topics Concern   Not on file  Social History Narrative   Not on file   Social Determinants of Health   Financial Resource Strain: High Risk (10/17/2022)   Overall Financial Resource Strain (CARDIA)    Difficulty of Paying Living Expenses: Hard  Food Insecurity: Unknown (10/17/2022)   Hunger Vital Sign    Worried About Running Out of Food in the Last Year: Never true    Ran Out of Food in the Last Year: Not on file  Transportation Needs: No Transportation Needs (10/17/2022)   PRAPARE - Administrator, Civil Service (Medical): No    Lack of Transportation (Non-Medical): No   Physical Activity: Insufficiently Active (10/17/2022)   Exercise Vital Sign    Days of Exercise per Week: 1 day    Minutes of Exercise per Session: 10 min  Stress: Stress Concern Present (10/17/2022)   [employer] of Occupational Health - Occupational Stress Questionnaire    Feeling of Stress : To some extent  Social Connections: Socially Isolated (10/17/2022)   Social Connection and Isolation Panel [NHANES]    Frequency of Communication with Friends and Family: More than three times a week    Frequency of Social Gatherings with Friends and Family: Never    Attends Religious Services: Never    Database administrator or Organizations: No    Attends Engineer, structural: Not on file    Marital Status: Divorced  Intimate Partner Violence: Not on file    Review of Systems  Constitutional:  Negative for fatigue and unexpected weight  change.  Eyes:  Negative for visual disturbance.  Respiratory:  Negative for cough, chest tightness and shortness of breath.   Cardiovascular:  Negative for chest pain, palpitations and leg swelling.  Gastrointestinal:  Negative for abdominal pain and blood in stool.  Neurological:  Negative for dizziness, light-headedness and headaches.     Objective:   Vitals:   10/18/22 0900  BP: 116/72  Pulse: 70  Temp: 98 F (36.7 C)  TempSrc: Temporal  SpO2: 98%  Weight: 183 lb 3.2 oz (83.1 kg)  Height: 6\' 4"  (1.93 m)     Physical Exam Vitals reviewed.  Constitutional:      Appearance: He is well-developed.  HENT:     Head: Normocephalic and atraumatic.  Neck:     Vascular: No carotid bruit or JVD.  Cardiovascular:     Rate and Rhythm: Normal rate and regular rhythm.     Heart sounds: Normal heart sounds. No murmur heard. Pulmonary:     Effort: Pulmonary effort is normal.     Breath sounds: Normal breath sounds. No rales.  Musculoskeletal:     Right lower leg: No edema.     Left lower leg: No edema.  Skin:    General: Skin is  warm and dry.  Neurological:     Mental Status: He is alert and oriented to person, place, and time.  Psychiatric:        Mood and Affect: Mood normal.        Assessment & Plan:  Kevin Evans is a 52 y.o. male . Type 2 diabetes mellitus without complication, without long-term current use of insulin (HCC) - Plan: FARXIGA 5 MG TABS tablet, metFORMIN (GLUCOPHAGE) 1000 MG tablet, Hemoglobin A1c  -Tolerating metformin, Farxiga, check updated A1c.  No med changes for now.  Stable previously.  91-month follow-up if continues to be stable.  Hyperlipidemia, unspecified hyperlipidemia type - Plan: atorvastatin (LIPITOR) 10 MG tablet, Lipid panel, Comprehensive metabolic panel  -Tolerating Lipitor, check updated labs, continue same regimen.  Need for hepatitis C screening test - Plan: Hepatitis C antibody  Need for shingles vaccine - Plan: Varicella-zoster vaccine IM  -Potential side effects discussed, repeat in 2 to 6 months.  Nocturia  -Followed by urology with upcoming appointment today.  No med changes.  Changes deferred to urology.  Meds ordered this encounter  Medications   atorvastatin (LIPITOR) 10 MG tablet    Sig: Take 1 tablet (10 mg total) by mouth daily.    Dispense:  90 tablet    Refill:  1   FARXIGA 5 MG TABS tablet    Sig: Take 1 tablet (5 mg total) by mouth daily before breakfast.    Dispense:  90 tablet    Refill:  1   metFORMIN (GLUCOPHAGE) 1000 MG tablet    Sig: Take 1 tablet (1,000 mg total) by mouth 2 (two) times daily with a meal.    Dispense:  180 tablet    Refill:  1   Patient Instructions  Thank you for coming in today.  No medication changes at this time.  Keep follow-up with urology as planned.  If any concerns on labs I will let you know.  First shingles vaccine given today, second one can be repeated in 2 to 6 months.  COVID booster should be available at the pharmacy with a new formulation in the next few months, I recommend that with the flu vaccine.  Take  care!     Signed,   Meredith Staggers,  MD [facility], [facility] 10/18/22 9:39 AM

## 2022-10-19 LAB — HEPATITIS C ANTIBODY: Hepatitis C Ab: NONREACTIVE

## 2022-10-20 ENCOUNTER — Other Ambulatory Visit: Payer: Self-pay | Admitting: Family Medicine

## 2022-10-20 DIAGNOSIS — E119 Type 2 diabetes mellitus without complications: Secondary | ICD-10-CM

## 2022-12-28 ENCOUNTER — Other Ambulatory Visit: Payer: Self-pay | Admitting: Family Medicine

## 2022-12-28 DIAGNOSIS — E119 Type 2 diabetes mellitus without complications: Secondary | ICD-10-CM

## 2022-12-28 DIAGNOSIS — E785 Hyperlipidemia, unspecified: Secondary | ICD-10-CM

## 2023-01-11 NOTE — Telephone Encounter (Signed)
Encourage patient to contact the pharmacy for refills or they can request refills through [facility]   WHAT PHARMACY WOULD THEY LIKE THIS SENT TO:  [facility]  [address], [place], [place] [address] [phone]  MEDICATION NAME & DOSE: alfuzosin (UROXATRAL) 10 MG 24 hr tablet   NOTES/COMMENTS FROM PATIENT:      Front office please notify patient: It takes 48-72 hours to process rx refill requests Ask patient to call pharmacy to ensure rx is ready before heading there.

## 2023-01-17 ENCOUNTER — Telehealth: Payer: Self-pay | Admitting: Family Medicine

## 2023-01-17 MED ORDER — ALFUZOSIN HCL ER 10 MG PO TB24: 10.0000 mg | ORAL_TABLET | Freq: Every day | ORAL | 1 refills | Status: AC

## 2023-01-17 NOTE — Telephone Encounter (Addendum)
Encourage patient to contact the pharmacy for refills or they can request refills through [facility]  (Please schedule appointment if patient has not been seen in over a year)    WHAT PHARMACY WOULD THEY LIKE THIS SENT TO: [id number] - [place], [place] - [address] [place] [address]   MEDICATION NAME & DOSE: alfuzosin (UROXATRAL) 10 MG 24 hr tablet   NOTES/COMMENTS FROM PATIENT: Pt is driving a truck at the moment and would like his prescription sent to the [facility] in [place], [place]. Pt attempted to refill through pharmacy and was told by pharmacy to contact provider's office.     Front office please notify patient: It takes 48-72 hours to process rx refill requests Ask patient to call pharmacy to ensure rx is ready before heading there.

## 2023-01-17 NOTE — Telephone Encounter (Signed)
Chart reviewed.  Last visit with me in December of last year.   Has seen urology since including Hydrocolectomy in March.  Alfuzosin 10 mg daily at bedtime continued on discharge at that time.  Prescription refilled.  Keep follow-up with me in January as planned.

## 2023-01-22 ENCOUNTER — Other Ambulatory Visit: Payer: Self-pay | Admitting: Family Medicine

## 2023-01-22 DIAGNOSIS — E119 Type 2 diabetes mellitus without complications: Secondary | ICD-10-CM

## 2023-01-24 ENCOUNTER — Telehealth: Payer: Self-pay | Admitting: Family Medicine

## 2023-01-24 DIAGNOSIS — E119 Type 2 diabetes mellitus without complications: Secondary | ICD-10-CM

## 2023-01-24 MED ORDER — METFORMIN HCL 1000 MG PO TABS: 1000.0000 mg | ORAL_TABLET | Freq: Two times a day (BID) | ORAL | 1 refills | Status: DC

## 2023-01-24 MED ORDER — FARXIGA 5 MG PO TABS: 5.0000 mg | ORAL_TABLET | Freq: Every day | ORAL | 0 refills | Status: DC

## 2023-01-24 NOTE — Telephone Encounter (Signed)
Encourage patient to contact the pharmacy for refills or they can request refills through [facility]  (Please schedule appointment if patient has not been seen in over a year)    WHAT PHARMACY WOULD THEY LIKE THIS SENT TO: [facility] - [place], [place] - [address]   MEDICATION NAME & DOSE: metFORMIN (GLUCOPHAGE) 1000 MG tablet  FARXIGA 5 MG TABS tablet   NOTES/COMMENTS FROM PATIENT:      Front office please notify patient: It takes 48-72 hours to process rx refill requests Ask patient to call pharmacy to ensure rx is ready before heading there.

## 2023-01-24 NOTE — Telephone Encounter (Signed)
Sent RX and paired down pharmacy list to only current pharmacy pt has been informed

## 2023-04-20 ENCOUNTER — Ambulatory Visit: Payer: Commercial Managed Care - HMO | Admitting: Family Medicine

## 2023-04-21 ENCOUNTER — Ambulatory Visit: Payer: Commercial Managed Care - HMO | Admitting: Family Medicine

## 2023-04-22 ENCOUNTER — Ambulatory Visit: Payer: Self-pay | Admitting: Family Medicine

## 2023-04-22 NOTE — Telephone Encounter (Signed)
ER visit recommended by nurse and I agree with that if he has acute worsening of symptoms.  He is due for follow-up of chronic medical conditions, and recently canceled his January 22 visit as well as appointment yesterday.  Especially if he will be out of the country until [place] is there any way we can get him on my schedule on Monday so we can check some labs, refill meds at that time?

## 2023-04-22 NOTE — Telephone Encounter (Signed)
Should I advise patient to go to Urgent care or call Optometrist?

## 2023-04-22 NOTE — Telephone Encounter (Addendum)
Copied from CRM [id number]. Topic: Clinical - Red Word Triage >> Apr 22, 2023 12:51 PM Irine Seal wrote: [facility] that prompted transfer to Nurse Triage: sudden onset of disturbances in vision, lines moving and floating around, blurry. Pt stated no pain was present, but even with his glasses he can not see clearly.    Chief Complaint: Eye problem Symptoms: Patient stated that he has black things floating in his left eye Frequency: On and off for 3 days Pertinent Negatives: Patient denies blurred vision, headache Disposition: [x] ED /[] Urgent Care (no appt availability in office) / [] Appointment(In office/virtual)/ []  [facility]/ [] Home Care/ [] Refused Recommended Disposition /[] [facility]/ []  Follow-up with PCP Additional Notes: Patient stated for the past 3 days, he has been experiencing visual disturbance of his left eye. He has been seeing black lines floating or moving around. It got worse in the last 30 minutes, and patient stated his eye was full of them, but now it is less. Patient stated that he is still able to see and he is driving. Advised patient not to drive if vision is affected in any way. No availability in office. Recommended for patient to be seen at ED. Patient verbalized understanding. Patient also stated he is going to [place] on Tuesday and will be there until March. Patient wants to make sure he has enough of his meds and is requesting refills on the following medications Farxiga, Alfuzosin, and Metformin if appropriate. He stated he is out of Alfuzosin.     Reason for Disposition  Many floaters in the eye  (Exception: Floater(s) are a chronic symptom and this is unchanged from patient's baseline pattern.)  Answer Assessment - Initial Assessment Questions 1. DESCRIPTION: "How has your vision changed?" (e.g., complete vision loss, blurred vision, double vision, floaters, etc.)     Something black floating in eyes (was filling up vision earlier, but now  now)  2. LOCATION: "One or both eyes?" If one, ask: "Which eye?"     L eye  3. SEVERITY: "Can you see anything?" If Yes, ask: "What can you see?" (e.g., fine print)     Patient stated he is able to see  4. ONSET: "When did this begin?" "Did it start suddenly or has this been gradual?"     3 days ago. Got worse in last 30 minutes.  5. PATTERN: "Does this come and go, or has it been constant since it started?"     Comes and goes  6. PAIN: "Is there any pain in your eye(s)?"  (Scale 1-10; or mild, moderate, severe)   - NONE (0): No pain.   - MILD (1-3): Doesn't interfere with normal activities.   - MODERATE (4-7): Interferes with normal activities or awakens from sleep.    - SEVERE (8-10): Excruciating pain, unable to do any normal activities.     NO  7. CONTACTS-GLASSES: "Do you wear contacts or glasses?"     Wears glasses  8. CAUSE: "What do you think is causing this visual problem?"     Unknown, patient thinks it could be from his glasses  9. OTHER SYMPTOMS: "Do you have any other symptoms?" (e.g., confusion, headache, arm or leg weakness, speech problems)     No  Protocols used: Vision Loss or Change-A-AH

## 2023-04-22 NOTE — Telephone Encounter (Signed)
Patient has been scheduled for Monday and was advised of ER recommendation

## 2023-04-25 ENCOUNTER — Ambulatory Visit (INDEPENDENT_AMBULATORY_CARE_PROVIDER_SITE_OTHER): Payer: Commercial Managed Care - HMO | Admitting: Family Medicine

## 2023-04-25 ENCOUNTER — Encounter: Payer: Self-pay | Admitting: Family Medicine

## 2023-04-25 VITALS — BP 124/78 | HR 73 | Temp 97.9°F | Ht 76.0 in | Wt 188.6 lb

## 2023-04-25 DIAGNOSIS — Z7984 Long term (current) use of oral hypoglycemic drugs: Secondary | ICD-10-CM

## 2023-04-25 DIAGNOSIS — E785 Hyperlipidemia, unspecified: Secondary | ICD-10-CM

## 2023-04-25 DIAGNOSIS — Z23 Encounter for immunization: Secondary | ICD-10-CM | POA: Diagnosis not present

## 2023-04-25 DIAGNOSIS — H539 Unspecified visual disturbance: Secondary | ICD-10-CM

## 2023-04-25 DIAGNOSIS — E119 Type 2 diabetes mellitus without complications: Secondary | ICD-10-CM

## 2023-04-25 DIAGNOSIS — N401 Enlarged prostate with lower urinary tract symptoms: Secondary | ICD-10-CM | POA: Diagnosis not present

## 2023-04-25 LAB — COMPREHENSIVE METABOLIC PANEL
ALT: 14 U/L (ref 0–53)
AST: 18 U/L (ref 0–37)
Albumin: 4.6 g/dL (ref 3.5–5.2)
Alkaline Phosphatase: 47 U/L (ref 39–117)
BUN: 19 mg/dL (ref 6–23)
CO2: 30 meq/L (ref 19–32)
Calcium: 9.5 mg/dL (ref 8.4–10.5)
Chloride: 102 meq/L (ref 96–112)
Creatinine, Ser: 1 mg/dL (ref 0.40–1.50)
GFR: 87.6 mL/min (ref >60.00)
Glucose, Bld: 100 mg/dL — ABNORMAL HIGH (ref 70–99)
Potassium: 3.9 meq/L (ref 3.5–5.1)
Sodium: 138 meq/L (ref 135–145)
Total Bilirubin: 0.5 mg/dL (ref 0.2–1.2)
Total Protein: 7.9 g/dL (ref 6.0–8.3)

## 2023-04-25 LAB — LIPID PANEL
Cholesterol: 156 mg/dL (ref 0–200)
HDL: 48.4 mg/dL (ref >39.00)
LDL Cholesterol: 97 mg/dL (ref 0–99)
NonHDL: 107.78
Total CHOL/HDL Ratio: 3
Triglycerides: 54 mg/dL (ref 0.0–149.0)
VLDL: 10.8 mg/dL (ref 0.0–40.0)

## 2023-04-25 LAB — MICROALBUMIN / CREATININE URINE RATIO
Creatinine,U: 196.8 mg/dL
Microalb Creat Ratio: 0.7 mg/g (ref 0.0–30.0)
Microalb, Ur: 1.4 mg/dL (ref 0.0–1.9)

## 2023-04-25 LAB — HEMOGLOBIN A1C: Hgb A1c MFr Bld: 6.2 % (ref 4.6–6.5)

## 2023-04-25 LAB — HM DIABETES EYE EXAM

## 2023-04-25 MED ORDER — METFORMIN HCL 1000 MG PO TABS: 1000.0000 mg | ORAL_TABLET | Freq: Two times a day (BID) | ORAL | 1 refills | Status: DC

## 2023-04-25 MED ORDER — FARXIGA 5 MG PO TABS: 5.0000 mg | ORAL_TABLET | Freq: Every day | ORAL | 0 refills | Status: DC

## 2023-04-25 MED ORDER — ATORVASTATIN CALCIUM 10 MG PO TABS: 10.0000 mg | ORAL_TABLET | Freq: Every day | ORAL | 1 refills | Status: DC

## 2023-04-25 NOTE — Patient Instructions (Signed)
No medication changes at this time.  If any concerns on labs I will let you know. Keep follow-up with eye specialist today and have them send me a note if possible.  Hopeful you will be able to travel tomorrow and have a safe trip.  Take care!

## 2023-04-25 NOTE — Progress Notes (Signed)
Subjective:  Patient ID: Kevin Evans, male    DOB: Oct 12, 1970  Age: 53 y.o. MRN: [id number]  CC:  Chief Complaint  Patient presents with   Medical Management of Chronic Issues    Pt is doing well   Spots and/or Floaters    Pt notes went to urgent care Friday for Floaters and has been referred to specialist [facility] and sees them at 1pm today     HPI Kevin Evans presents for   Floaters in vision: As above, evaluated by urgent care few days ago.  Will be seeing eye specialist today at 1 PM.  [facility]. Left eye floaters. No pain. Circle in vision since yesterday. Plans to see specialist today.   Diabetes: Treated with metformin 1000 mg twice daily, Farxiga 5 mg daily.  He is on statin with Lipitor. Home readings fasting: under 125 Postprandial: 94.  No symptomatic lows.   Lowest in 70's.  Asx. Denies any side effects with current medications.  Microalbumin: Normal ratio 03/01/2022.  Repeat today. Optho, foot exam, pneumovax:  Flu vaccine: had at [facility]  Scheduled to fly to [place] tomorrow for 1.5 months. Going to see family.   Lab Results  Component Value Date   HGBA1C 6.0 10/18/2022   HGBA1C 6.7 (H) 03/01/2022   HGBA1C 5.9 08/05/2021   Lab Results  Component Value Date   MICROALBUR <0.7 03/01/2022   LDLCALC 89 10/18/2022   CREATININE 1.08 10/18/2022   Hyperlipidemia: Treated with Lipitor 10 mg daily.  Denies any myalgias/side effects. Sometimes forgets to take 2-3 per week. No current pill minder.  Lab Results  Component Value Date   CHOL 151 10/18/2022   HDL 48.30 10/18/2022   LDLCALC 89 10/18/2022   TRIG 66.0 10/18/2022   CHOLHDL 3 10/18/2022   Lab Results  Component Value Date   ALT 15 10/18/2022   AST 17 10/18/2022   ALKPHOS 49 10/18/2022   BILITOT 0.5 10/18/2022   BPH with LUTS Followed by urology previously.  Did not tolerate Silodosin. dosing due to retrograde ejaculation.  Prior hydrocelectomy.  Finasteride 5mg  per day and alfuzosin for  BPH symptoms. Doing ok.  Saw urology last week with med rf's.   Second shingrix today.   Immunization History  Administered Date(s) Administered   Hepatitis A, Adult 03/06/2015   Hepatitis B 06/03/2005, 07/05/2005, 11/10/2005   Influenza,inj,Quad PF,6+ Mos 04/13/2014, 12/10/2015, 03/09/2017, 01/03/2018, 12/31/2019, 03/01/2022   MMR 09/18/2004, 03/06/2015   Meningococcal Conjugate 03/06/2015   PFIZER(Purple Top)SARS-COV-2 Vaccination 06/24/2019, 07/15/2019, 02/15/2020   PNEUMOCOCCAL CONJUGATE-20 08/20/2020, 11/24/2020   Pfizer(Comirnaty)Fall Seasonal Vaccine 12 years and older 02/05/2023   Pneumococcal Polysaccharide-23 07/01/2015   Td 05/21/2002, 03/06/2015   Td (Adult),5 Lf Tetanus Toxid, Preservative Free 09/18/2004   Tdap 04/02/2005, 03/06/2015   Yellow Fever 03/05/2015   Zoster Recombinant(Shingrix) 10/18/2022, 04/25/2023   Zoster, Unspecified 10/18/2022    History Patient Active Problem List   Diagnosis Date Noted   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  06/18/2022   HLD (hyperlipidemia)    Hyperlipidemia    Phreesia 06/10/2020   Hypertension    No meds pt. denies at preop   Past Surgical History:  Procedure Laterality Date   APPENDECTOMY  06/20/2020   COLONOSCOPY     LAPAROSCOPIC RIGHT HEMI  COLECTOMY N/A 06/20/2020   Procedure: LAPAROSCOPIC RIGHT HEMI COLECTOMY;  Surgeon: Romie Levee, MD;  Location: [facility];  Service: General;  Laterality: N/A;   NO PAST SURGERIES     SPERMATOCELECTOMY Right 06/22/2022   Procedure: HYDROCELECTOMY;  Surgeon: Belva Agee, MD;  Location: [facility];  Service: Urology;  Laterality: Right;   UPPER GASTROINTESTINAL ENDOSCOPY     Allergies  Allergen Reactions   Other Itching and Swelling    Bactrim [Sulfamethoxazole-Trimethoprim] Itching    Itching all over body    Shrimp (Diagnostic)     Under cooked shrimp, facial swelling   Shrimp Extract Other (See Comments)    Under cooked shrimp, facial swelling   Prior to Admission medications   Medication Sig Start Date End Date Taking? Authorizing Provider  alfuzosin (UROXATRAL) 10 MG 24 hr tablet Take 1 tablet (10 mg total) by mouth at bedtime. 01/17/23  Yes Shade Flood, MD  atorvastatin (LIPITOR) 10 MG tablet Take 1 tablet (10 mg total) by mouth daily. 10/18/22  Yes Shade Flood, MD  blood glucose meter kit and supplies Dispense based on patient and insurance preference.check up to once per day. 01/01/20  Yes Georgina Quint, MD  Blood Glucose Monitoring Suppl w/Device KIT Monitor glucose twice daily 07/04/15  Yes Dorna Leitz, PA-C  FARXIGA 5 MG TABS tablet Take 1 tablet (5 mg total) by mouth daily before breakfast. 01/24/23  Yes Shade Flood, MD  metFORMIN (GLUCOPHAGE) 1000 MG tablet Take 1 tablet (1,000 mg total) by mouth 2 (two) times daily with a meal. 01/24/23  Yes Shade Flood, MD  traMADol (ULTRAM) 50 MG tablet Take 1 tablet (50 mg total) by mouth every 6 (six) hours as needed. 06/22/22 06/22/23 Yes Belva Agee, MD   Social History   Socioeconomic History   Marital status: Single    Spouse name: Not on file   Number of children: 0   Years of education: Not on file   Highest education level: Master's degree (e.g., MA, MS, MEng, MEd, MSW, MBA)  Occupational History   Occupation: truck driver  Tobacco Use   Smoking status: Never   Smokeless tobacco: Never  Vaping Use   Vaping status: Never Used  Substance and Sexual Activity   Alcohol use: No    Alcohol/week: 0.0 standard drinks of alcohol   Drug use: No   Sexual activity: Yes    Birth control/protection: Condom  Other Topics Concern   Not on file  Social History Narrative   Not on file   Social Drivers of Health   Financial  Resource Strain: Medium Risk (04/18/2023)   Overall Financial Resource Strain (CARDIA)    Difficulty of Paying Living Expenses: Somewhat hard  Food Insecurity: Food Insecurity Present (04/18/2023)   Hunger Vital Sign    Worried About Running Out of Food in the Last Year: Never true    Ran Out of Food in the Last Year: Sometimes true  Transportation Needs: No Transportation Needs (04/18/2023)   PRAPARE - Administrator, Civil Service (Medical): No    Lack of Transportation (Non-Medical): No  Physical Activity: Insufficiently Active (04/18/2023)   Exercise Vital Sign    Days of Exercise per Week: 4 days    Minutes of Exercise per Session: 10 min  Stress: No Stress Concern Present (04/18/2023)   [employer] of Occupational Health - Occupational Stress Questionnaire    Feeling of Stress : Only a little  Social  Connections: Moderately Integrated (04/18/2023)   Social Connection and Isolation Panel [NHANES]    Frequency of Communication with Friends and Family: More than three times a week    Frequency of Social Gatherings with Friends and Family: Once a week    Attends Religious Services: 1 to 4 times per year    Active Member of [employer] or Organizations: No    Attends Engineer, structural: Not on file    Marital Status: Married  Catering manager Violence: Not on file    Review of Systems  Constitutional:  Negative for fatigue and unexpected weight change.  Eyes:  Positive for visual disturbance.  Respiratory:  Negative for cough, chest tightness and shortness of breath.   Cardiovascular:  Negative for chest pain, palpitations and leg swelling.  Gastrointestinal:  Negative for abdominal pain and blood in stool.  Neurological:  Negative for dizziness, light-headedness and headaches.     Objective:   Vitals:   04/25/23 0908  BP: 124/78  Pulse: 73  Temp: 97.9 F (36.6 C)  TempSrc: Temporal  SpO2: 100%  Weight: 188 lb 9.6 oz (85.5 kg)  Height: 6\' 4"  (1.93 m)      Physical Exam Vitals reviewed.  Constitutional:      Appearance: He is well-developed.  HENT:     Head: Normocephalic and atraumatic.  Eyes:     Extraocular Movements: Extraocular movements intact.     Pupils: Pupils are equal, round, and reactive to light.     Comments: Slight flattening, anterior chamber clear.  No appreciable injection.  Neck:     Vascular: No carotid bruit or JVD.  Cardiovascular:     Rate and Rhythm: Normal rate and regular rhythm.     Heart sounds: Normal heart sounds. No murmur heard. Pulmonary:     Effort: Pulmonary effort is normal.     Breath sounds: Normal breath sounds. No rales.  Musculoskeletal:     Right lower leg: No edema.     Left lower leg: No edema.  Skin:    General: Skin is warm and dry.  Neurological:     Mental Status: He is alert and oriented to person, place, and time.  Psychiatric:        Mood and Affect: Mood normal.     Assessment & Plan:  Kevin Evans is a 53 y.o. male . Type 2 diabetes mellitus without complication, without long-term current use of insulin (HCC) - Plan: FARXIGA 5 MG TABS tablet, metFORMIN (GLUCOPHAGE) 1000 MG tablet, Comprehensive metabolic panel, Hemoglobin A1c, Microalbumin / creatinine urine ratio  -Check A1c, urine microalbumin, tolerating current med regimen.  Continue same.  Adjustment of plan based on results.  Hyperlipidemia, unspecified hyperlipidemia type - Plan: atorvastatin (LIPITOR) 10 MG tablet, Comprehensive metabolic panel, Lipid panel  Check lipids, continue Lipitor same dose for now.  Need for shingles vaccine - Plan: Varicella-zoster vaccine IM  -Second Shingrix given, potential side effects discussed.  Benign prostatic hyperplasia with lower urinary tract symptoms, symptom details unspecified  -Recent visit with urology, tolerating current med regimen, reports recently had refills of meds.  Visual disturbance  -New left visual floaters, appointment with retina specialist this  afternoon.  Meds ordered this encounter  Medications   atorvastatin (LIPITOR) 10 MG tablet    Sig: Take 1 tablet (10 mg total) by mouth daily.    Dispense:  90 tablet    Refill:  1   FARXIGA 5 MG TABS tablet    Sig: Take 1 tablet (5  mg total) by mouth daily before breakfast.    Dispense:  90 tablet    Refill:  0   metFORMIN (GLUCOPHAGE) 1000 MG tablet    Sig: Take 1 tablet (1,000 mg total) by mouth 2 (two) times daily with a meal.    Dispense:  180 tablet    Refill:  1   Patient Instructions  No medication changes at this time.  If any concerns on labs I will let you know. Keep follow-up with eye specialist today and have them send me a note if possible.  Hopeful you will be able to travel tomorrow and have a safe trip.  Take care!    Signed,   Meredith Staggers, MD [facility], [facility] 04/25/23 10:48 AM

## 2023-04-29 ENCOUNTER — Encounter: Payer: Self-pay | Admitting: Family Medicine

## 2023-10-13 ENCOUNTER — Encounter: Admitting: Family Medicine

## 2023-10-26 ENCOUNTER — Encounter: Payer: Commercial Managed Care - HMO | Admitting: Family Medicine

## 2023-12-14 ENCOUNTER — Encounter: Admitting: Family Medicine

## 2024-01-28 ENCOUNTER — Other Ambulatory Visit: Payer: Self-pay | Admitting: Family Medicine

## 2024-01-28 DIAGNOSIS — E119 Type 2 diabetes mellitus without complications: Secondary | ICD-10-CM

## 2024-01-30 ENCOUNTER — Encounter: Admitting: Family Medicine

## 2024-02-29 ENCOUNTER — Ambulatory Visit: Payer: Self-pay | Admitting: Family Medicine

## 2024-02-29 ENCOUNTER — Encounter: Payer: Self-pay | Admitting: Family Medicine

## 2024-02-29 VITALS — BP 125/64 | HR 73 | Temp 97.6°F | Resp 14 | Ht 76.0 in | Wt 190.0 lb

## 2024-02-29 DIAGNOSIS — E785 Hyperlipidemia, unspecified: Secondary | ICD-10-CM

## 2024-02-29 DIAGNOSIS — E119 Type 2 diabetes mellitus without complications: Secondary | ICD-10-CM

## 2024-02-29 DIAGNOSIS — N401 Enlarged prostate with lower urinary tract symptoms: Secondary | ICD-10-CM

## 2024-02-29 DIAGNOSIS — D373 Neoplasm of uncertain behavior of appendix: Secondary | ICD-10-CM

## 2024-02-29 DIAGNOSIS — Z0000 Encounter for general adult medical examination without abnormal findings: Secondary | ICD-10-CM

## 2024-02-29 LAB — MICROALBUMIN / CREATININE URINE RATIO
Creatinine,U: 146.6 mg/dL
Microalb Creat Ratio: 7.8 mg/g (ref 0.0–30.0)
Microalb, Ur: 1.1 mg/dL (ref 0.0–1.9)

## 2024-02-29 MED ORDER — ATORVASTATIN CALCIUM 10 MG PO TABS: 10.0000 mg | ORAL_TABLET | Freq: Every day | ORAL | 1 refills | Status: AC

## 2024-02-29 MED ORDER — FARXIGA 5 MG PO TABS: 5.0000 mg | ORAL_TABLET | Freq: Every day | ORAL | 1 refills | Status: AC

## 2024-02-29 MED ORDER — FINASTERIDE 5 MG PO TABS: 5.0000 mg | ORAL_TABLET | Freq: Every day | ORAL | 1 refills | Status: AC

## 2024-02-29 NOTE — Patient Instructions (Addendum)
 You are overdue for follow-up with Dr. Debby as well as for repeat CT and CEA testing.  Please call [facility] as soon as possible to schedule those tests, and follow-up.  [phone]  I will refill finasteride, but follow up with urology regarding any med questions and repeat exam.   Try to incorporate some walking or low intensity exercise with ultimate goal of 150 minutes of exercise per week spread out over most days.  Thank you for coming in today. No change in medications at this time. If there are any concerns on your bloodwork, I will let you know. Take care!  Preventive Care 51-37 Years Old, Male Preventive care refers to lifestyle choices and visits with your health care provider that can promote health and wellness. Preventive care visits are also called wellness exams. What can I expect for my preventive care visit? Counseling During your preventive care visit, your health care provider may ask about your: Medical history, including: Past medical problems. Family medical history. Current health, including: Emotional well-being. Home life and relationship well-being. Sexual activity. Lifestyle, including: Alcohol, nicotine or tobacco, and drug use. Access to firearms. Diet, exercise, and sleep habits. Safety issues such as seatbelt and bike helmet use. Sunscreen use. Work and work astronomer. Physical exam Your health care provider will check your: Height and weight. These may be used to calculate your BMI (body mass index). BMI is a measurement that tells if you are at a healthy weight. Waist circumference. This measures the distance around your waistline. This measurement also tells if you are at a healthy weight and may help predict your risk of certain diseases, such as type 2 diabetes and high blood pressure. Heart rate and blood pressure. Body temperature. Skin for abnormal spots. What immunizations do I need?  Vaccines are usually given at  various ages, according to a schedule. Your health care provider will recommend vaccines for you based on your age, medical history, and lifestyle or other factors, such as travel or where you work. What tests do I need? Screening Your health care provider may recommend screening tests for certain conditions. This may include: Lipid and cholesterol levels. Diabetes screening. This is done by checking your blood sugar (glucose) after you have not eaten for a while (fasting). Hepatitis B test. Hepatitis C test. HIV (human immunodeficiency virus) test. STI (sexually transmitted infection) testing, if you are at risk. Lung cancer screening. Prostate cancer screening. Colorectal cancer screening. Talk with your health care provider about your test results, treatment options, and if necessary, the need for more tests. Follow these instructions at home: Eating and drinking  Eat a diet that includes fresh fruits and vegetables, whole grains, lean protein, and low-fat dairy products. Take vitamin and mineral supplements as recommended by your health care provider. Do not drink alcohol if your health care provider tells you not to drink. If you drink alcohol: Limit how much you have to 0-2 drinks a day. Know how much alcohol is in your drink. In the [place] one drink equals one 12 oz bottle of beer (355 mL), one 5 oz glass of wine (148 mL), or one 1 oz glass of hard liquor (44 mL). Lifestyle Brush your teeth every morning and night with fluoride toothpaste. Floss one time each day. Exercise for at least 30 minutes 5 or more days each week. Do not use any products that contain nicotine or tobacco. These products include cigarettes, chewing tobacco, and vaping devices, such as e-cigarettes. If you need  help quitting, ask your health care provider. Do not use drugs. If you are sexually active, practice safe sex. Use a condom or other form of protection to prevent STIs. Take aspirin only as told by  your health care provider. Make sure that you understand how much to take and what form to take. Work with your health care provider to find out whether it is safe and beneficial for you to take aspirin daily. Find healthy ways to manage stress, such as: Meditation, yoga, or listening to music. Journaling. Talking to a trusted person. Spending time with friends and family. Minimize exposure to UV radiation to reduce your risk of skin cancer. Safety Always wear your seat belt while driving or riding in a vehicle. Do not drive: If you have been drinking alcohol. Do not ride with someone who has been drinking. When you are tired or distracted. While texting. If you have been using any mind-altering substances or drugs. Wear a helmet and other protective equipment during sports activities. If you have firearms in your house, make sure you follow all gun safety procedures. What's next? Go to your health care provider once a year for an annual wellness visit. Ask your health care provider how often you should have your eyes and teeth checked. Stay up to date on all vaccines. This information is not intended to replace advice given to you by your health care provider. Make sure you discuss any questions you have with your health care provider. Document Revised: 09/10/2020 Document Reviewed: 09/10/2020 Elsevier Patient Education  2024 [employer].

## 2024-02-29 NOTE — Progress Notes (Unsigned)
 Subjective:  Patient ID: Kevin Evans, male    DOB: 12-28-1970  Age: 53 y.o. MRN: [id number]  CC:  Chief Complaint  Patient presents with   Annual Exam    Patient reports he is feeling better about his prostate. Would like medications to be 90D besides the Finasteride      HPI Kevin Evans presents for Annual Exam PCP: me Urology: Dr. Rosalind, BPH with LUTS.  Spermatocoelectomy in 2024. Takes alfuzosin , finasteride .  Appointment in February.  Only mild elevated residual at that time with PVR.  Option of proceeding towards definitive outlet reduction procedure but he preferred to avoid surgery.  Alfuzosin  twice daily as an option along with finasteride  and potential side effects were discussed.  Feeling better - only taking alfuzosin  and finasteride  once per day.   General surgery, Dr. Debby, low-grade mucinous neoplasm of appendix.  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  Continued following her CEA recommended with repeat of CT chest abdomen pelvis in 1 year which would be around December 2024 for repeat CEA and chest CT.  Has not had that follow-up.  Diabetes: Metformin  1000 mg twice daily, Farxiga  5 mg daily, statin with Lipitor.  Denies new side effects with meds. Home readings: fasting under 125 After meals 130-150 at highest.   Microalbumin: Due, will check today. Optho, foot exam, pneumovax: Up-to-date.  Lab Results  Component Value Date   HGBA1C 6.2 04/25/2023   HGBA1C 6.0 10/18/2022   HGBA1C 6.7 (H) 03/01/2022   Lab Results  Component Value Date   MICROALBUR 4.3 04/03/2015   LDLCALC 97 04/25/2023   CREATININE 1.00 04/25/2023   Hyperlipidemia: Lipitor 10 mg daily, adherence recommendations discussed at prior visit.  Intermittent missed doses when discussed in January.  Taking 5-6 days per week.  Snack 3 hrs ago.  Lab Results  Component Value Date   CHOL 156 04/25/2023   HDL 48.40 04/25/2023   LDLCALC 97 04/25/2023   TRIG 54.0 04/25/2023    CHOLHDL 3 04/25/2023   Lab Results  Component Value Date   ALT 14 04/25/2023   AST 18 04/25/2023   ALKPHOS 47 04/25/2023   BILITOT 0.5 04/25/2023         02/29/2024    1:20 PM 04/25/2023    9:04 AM 10/18/2022    8:58 AM 03/01/2022    1:45 PM 08/05/2021    2:27 PM  Depression screen PHQ 2/9  Decreased Interest 0 0 0 0 0  Down, Depressed, Hopeless 0 0 0 0 0  PHQ - 2 Score 0 0 0 0 0  Altered sleeping 1 1 0 0 0  Tired, decreased energy 0 1 0 0 1  Change in appetite 0 0 0 0 0  Feeling bad or failure about yourself  0 0 0 0 0  Trouble concentrating 1 1 0 0 0  Moving slowly or fidgety/restless 0 0 0 0 0  Suicidal thoughts 0 0 0 0 0  PHQ-9 Score 2 3  0  0  1   Difficult doing work/chores Not difficult at all Not difficult at all        Data saved with a previous flowsheet row definition    Health Maintenance  Topic Date Due   Diabetic kidney evaluation - Urine ACR  07/08/2020   HEMOGLOBIN A1C  10/23/2023   Influenza Vaccine  10/28/2023   COVID-19 Vaccine (5 - 2025-26 season) 11/28/2023   Diabetic kidney evaluation - eGFR measurement  04/24/2024   FOOT EXAM  04/24/2024   OPHTHALMOLOGY EXAM  04/24/2024   DTaP/Tdap/Td (6 - Td or Tdap) 03/05/2025   Colonoscopy  05/28/2030   Pneumococcal Vaccine: 50+ Years  Completed   Hepatitis B Vaccines 19-59 Average Risk  Completed   Hepatitis C Screening  Completed   HIV Screening  Completed   Zoster Vaccines- Shingrix   Completed   HPV VACCINES  Aged Out   Meningococcal B Vaccine  Aged Out  Colonoscopy 05/27/2020 Dr. Albertus.  Repeat in 10 years. Prostate: followed by urology as above.  Lab Results  Component Value Date   PSA 2.12 08/05/2021   PSA 3.81 11/24/2020    Immunization History  Administered Date(s) Administered   Hepatitis A, Adult 03/06/2015   Hepatitis B 06/03/2005, 07/05/2005, 11/10/2005   Influenza,inj,Quad PF,6+ Mos 04/13/2014, 12/10/2015, 03/09/2017, 01/03/2018, 12/31/2019, 03/01/2022   MMR 09/18/2004, 03/06/2015    Meningococcal Conjugate 03/06/2015   PFIZER(Purple Top)SARS-COV-2 Vaccination 06/24/2019, 07/15/2019, 02/15/2020   PNEUMOCOCCAL CONJUGATE-20 08/20/2020, 11/24/2020   Pfizer(Comirnaty)Fall Seasonal Vaccine 12 years and older 02/05/2023   Pneumococcal Polysaccharide-23 07/01/2015   Td 05/21/2002, 03/06/2015   Td (Adult),5 Lf Tetanus Toxid, Preservative Free 09/18/2004   Tdap 04/02/2005, 03/06/2015   Yellow Fever 03/05/2015   Zoster Recombinant(Shingrix ) 10/18/2022, 04/25/2023   Zoster, Unspecified 10/18/2022  Flu and covid vaccines were [facility]  No results found. Optho - in past year or so - plans to schedule.   Dental: at home in [place] - April.   Alcohol: none.   Tobacco: none  Exercise: truck driving - minimal.   History Patient Active Problem List   Diagnosis Date Noted   Appendiceal tumor 06/20/2020   Hyperlipidemia 03/09/2017   Chronic mycotic otitis externa 02/12/2016   Diabetes (HCC) 04/08/2015   Past Medical History:  Diagnosis Date   Allergy    Diabetes mellitus without complication (HCC)    type 2   GERD (gastroesophageal reflux disease)    resolved with diet change.  06/18/2022   HLD (hyperlipidemia)    Hyperlipidemia    Phreesia 06/10/2020   Hypertension    No meds pt. denies at preop   Past Surgical History:  Procedure Laterality Date   APPENDECTOMY  06/20/2020   COLONOSCOPY     LAPAROSCOPIC RIGHT HEMI COLECTOMY N/A 06/20/2020   Procedure: LAPAROSCOPIC RIGHT HEMI COLECTOMY;  Surgeon: Debby Hila, MD;  Location: [facility];  Service: General;  Laterality: N/A;   NO PAST SURGERIES     SMALL INTESTINE SURGERY     SPERMATOCELECTOMY Right 06/22/2022   Procedure: HYDROCELECTOMY;  Surgeon: Rosalind Zachary NOVAK, MD;  Location: [facility];  Service: Urology;  Laterality: Right;   UPPER GASTROINTESTINAL ENDOSCOPY     Allergies  Allergen Reactions   Other Itching and Swelling   Bactrim [Sulfamethoxazole-Trimethoprim] Itching    Itching all  over body    Shrimp (Diagnostic)     Under cooked shrimp, facial swelling   Shrimp Extract Other (See Comments)    Under cooked shrimp, facial swelling   Prior to Admission medications   Medication Sig Start Date End Date Taking? Authorizing Provider  alfuzosin  (UROXATRAL ) 10 MG 24 hr tablet Take 1 tablet (10 mg total) by mouth at bedtime. 01/17/23  Yes Levora Reyes SAUNDERS, MD  atorvastatin  (LIPITOR) 10 MG tablet Take 1 tablet (10 mg total) by mouth daily. 04/25/23  Yes Levora Reyes SAUNDERS, MD  Blood Glucose Monitoring Suppl w/Device KIT Monitor glucose twice daily 07/04/15  Yes Bush, Nat GAILS, PA-C  FARXIGA  5 MG TABS tablet TAKE 1  TABLET BY MOUTH ONCE DAILY BEFORE BREAKFAST 01/30/24  Yes Levora Reyes SAUNDERS, MD  finasteride  (PROSCAR ) 5 MG tablet Take 5 mg by mouth daily.   Yes [provider]  metFORMIN  (GLUCOPHAGE ) 1000 MG tablet Take 1 tablet (1,000 mg total) by mouth 2 (two) times daily with a meal. 04/25/23  Yes Levora Reyes SAUNDERS, MD  blood glucose meter kit and supplies Dispense based on patient and insurance preference.check up to once per day. Patient not taking: Reported on 02/29/2024 01/01/20   Purcell Emil Schanz, MD   Social History   Socioeconomic History   Marital status: Single    Spouse name: Not on file   Number of children: 3   Years of education: Not on file   Highest education level: Master's degree (e.g., MA, MS, MEng, MEd, MSW, MBA)  Occupational History   Occupation: truck driver  Tobacco Use   Smoking status: Never   Smokeless tobacco: Never  Vaping Use   Vaping status: Never Used  Substance and Sexual Activity   Alcohol use: Never   Drug use: Never   Sexual activity: Yes    Birth control/protection: None  Other Topics Concern   Not on file  Social History Narrative   Patient has 2 girls and 1 boy. They are in [place]. They are 52, 23 and 51 years old.    Social Drivers of Corporate Investment Banker Strain: Low Risk  (02/28/2024)   Overall Financial  Resource Strain (CARDIA)    Difficulty of Paying Living Expenses: Not very hard  Food Insecurity: No Food Insecurity (02/28/2024)   Hunger Vital Sign    Worried About Running Out of Food in the Last Year: Never true    Ran Out of Food in the Last Year: Never true  Transportation Needs: No Transportation Needs (02/28/2024)   PRAPARE - Administrator, Civil Service (Medical): No    Lack of Transportation (Non-Medical): No  Physical Activity: Insufficiently Active (02/28/2024)   Exercise Vital Sign    Days of Exercise per Week: 3 days    Minutes of Exercise per Session: 20 min  Stress: Stress Concern Present (02/28/2024)   [employer] of Occupational Health - Occupational Stress Questionnaire    Feeling of Stress: To some extent  Social Connections: Socially Integrated (02/28/2024)   Social Connection and Isolation Panel    Frequency of Communication with Friends and Family: More than three times a week    Frequency of Social Gatherings with Friends and Family: Once a week    Attends Religious Services: More than 4 times per year    Active Member of [employer] or Organizations: Yes    Attends Banker Meetings: Never    Marital Status: Married  Catering Manager Violence: Not on file    Review of Systems 13 point review of systems per patient health survey noted.  Negative other than as indicated above or in HPI.    Objective:   Vitals:   02/29/24 1316  BP: 125/64  Pulse: 73  Resp: 14  Temp: 97.6 F (36.4 C)  TempSrc: Temporal  SpO2: 100%  Weight: 190 lb (86.2 kg)  Height: 6' 4 (1.93 m)     Physical Exam Vitals reviewed.  Constitutional:      Appearance: He is well-developed.  HENT:     Head: Normocephalic and atraumatic.     Right Ear: External ear normal.     Left Ear: External ear normal.  Eyes:  Conjunctiva/sclera: Conjunctivae normal.     Pupils: Pupils are equal, round, and reactive to light.  Neck:     Thyroid: No thyromegaly.   Cardiovascular:     Rate and Rhythm: Normal rate and regular rhythm.     Heart sounds: Normal heart sounds.  Pulmonary:     Effort: Pulmonary effort is normal. No respiratory distress.     Breath sounds: Normal breath sounds. No wheezing.  Abdominal:     General: There is no distension.     Palpations: Abdomen is soft.     Tenderness: There is no abdominal tenderness.  Musculoskeletal:        General: No tenderness. Normal range of motion.     Cervical back: Normal range of motion and neck supple.  Lymphadenopathy:     Cervical: No cervical adenopathy.  Skin:    General: Skin is warm and dry.  Neurological:     Mental Status: He is alert and oriented to person, place, and time.     Deep Tendon Reflexes: Reflexes are normal and symmetric.  Psychiatric:        Behavior: Behavior normal.        Assessment & Plan:  Kairyn Olmeda is a 53 y.o. male . Annual physical exam - Plan: CBC  - -anticipatory guidance as below in AVS, screening labs above. Health maintenance items as above in HPI discussed/recommended as applicable.     Type 2 diabetes mellitus without complication, without long-term current use of insulin  (HCC) - Plan: Urine Albumin/Creatinine with ratio (send out) [LAB689], Hemoglobin A1c, Comprehensive metabolic panel with GFR, CBC  - tolerating current med regimen, check labs and adjust plan accordingly   Appendiceal tumor  -due for follow up with surgeon as above and follow up testing - he will call. Number provided.   Hyperlipidemia, unspecified hyperlipidemia type - Plan: Lipid panel  -  Stable, tolerating current regimen. Medications refilled. Labs pending as above.   BPH with lower urinary tract symptoms without urinary obstruction - Plan: finasteride  (PROSCAR ) 5 MG tablet  -stable with current med regimen, refilled finasteride  but advised follow up with urology for repeat exam/follow up.   Meds ordered this encounter  Medications   finasteride  (PROSCAR ) 5 MG  tablet    Sig: Take 1 tablet (5 mg total) by mouth daily.    Dispense:  90 tablet    Refill:  1   Patient Instructions  You are overdue for follow-up with Dr. Debby as well as for repeat CT and CEA testing.  Please call [facility] as soon as possible to schedule those tests, and follow-up.  [phone]  I will refill finasteride , but follow up with urology regarding any med questions and repeat exam.   Try to incorporate some walking or low intensity exercise with ultimate goal of 150 minutes of exercise per week spread out over most days.  Thank you for coming in today. No change in medications at this time. If there are any concerns on your bloodwork, I will let you know. Take care!  Preventive Care 67-35 Years Old, Male Preventive care refers to lifestyle choices and visits with your health care provider that can promote health and wellness. Preventive care visits are also called wellness exams. What can I expect for my preventive care visit? Counseling During your preventive care visit, your health care provider may ask about your: Medical history, including: Past medical problems. Family medical history. Current health, including: Emotional well-being. Home life and relationship well-being. Sexual  activity. Lifestyle, including: Alcohol, nicotine or tobacco, and drug use. Access to firearms. Diet, exercise, and sleep habits. Safety issues such as seatbelt and bike helmet use. Sunscreen use. Work and work astronomer. Physical exam Your health care provider will check your: Height and weight. These may be used to calculate your BMI (body mass index). BMI is a measurement that tells if you are at a healthy weight. Waist circumference. This measures the distance around your waistline. This measurement also tells if you are at a healthy weight and may help predict your risk of certain diseases, such as type 2 diabetes and high blood pressure. Heart rate and blood  pressure. Body temperature. Skin for abnormal spots. What immunizations do I need?  Vaccines are usually given at various ages, according to a schedule. Your health care provider will recommend vaccines for you based on your age, medical history, and lifestyle or other factors, such as travel or where you work. What tests do I need? Screening Your health care provider may recommend screening tests for certain conditions. This may include: Lipid and cholesterol levels. Diabetes screening. This is done by checking your blood sugar (glucose) after you have not eaten for a while (fasting). Hepatitis B test. Hepatitis C test. HIV (human immunodeficiency virus) test. STI (sexually transmitted infection) testing, if you are at risk. Lung cancer screening. Prostate cancer screening. Colorectal cancer screening. Talk with your health care provider about your test results, treatment options, and if necessary, the need for more tests. Follow these instructions at home: Eating and drinking  Eat a diet that includes fresh fruits and vegetables, whole grains, lean protein, and low-fat dairy products. Take vitamin and mineral supplements as recommended by your health care provider. Do not drink alcohol if your health care provider tells you not to drink. If you drink alcohol: Limit how much you have to 0-2 drinks a day. Know how much alcohol is in your drink. In the [place] one drink equals one 12 oz bottle of beer (355 mL), one 5 oz glass of wine (148 mL), or one 1 oz glass of hard liquor (44 mL). Lifestyle Brush your teeth every morning and night with fluoride toothpaste. Floss one time each day. Exercise for at least 30 minutes 5 or more days each week. Do not use any products that contain nicotine or tobacco. These products include cigarettes, chewing tobacco, and vaping devices, such as e-cigarettes. If you need help quitting, ask your health care provider. Do not use drugs. If you are sexually  active, practice safe sex. Use a condom or other form of protection to prevent STIs. Take aspirin only as told by your health care provider. Make sure that you understand how much to take and what form to take. Work with your health care provider to find out whether it is safe and beneficial for you to take aspirin daily. Find healthy ways to manage stress, such as: Meditation, yoga, or listening to music. Journaling. Talking to a trusted person. Spending time with friends and family. Minimize exposure to UV radiation to reduce your risk of skin cancer. Safety Always wear your seat belt while driving or riding in a vehicle. Do not drive: If you have been drinking alcohol. Do not ride with someone who has been drinking. When you are tired or distracted. While texting. If you have been using any mind-altering substances or drugs. Wear a helmet and other protective equipment during sports activities. If you have firearms in your house, make sure you follow  all gun safety procedures. What's next? Go to your health care provider once a year for an annual wellness visit. Ask your health care provider how often you should have your eyes and teeth checked. Stay up to date on all vaccines. This information is not intended to replace advice given to you by your health care provider. Make sure you discuss any questions you have with your health care provider. Document Revised: 09/10/2020 Document Reviewed: 09/10/2020 Elsevier Patient Education  2024 Elsevier Inc.      Signed,   Reyes Pines, MD [facility], [facility] 02/29/24 2:01 PM

## 2024-03-01 LAB — LIPID PANEL
Cholesterol: 140 mg/dL (ref 0–200)
HDL: 41.3 mg/dL (ref >39.00)
LDL Cholesterol: 75 mg/dL (ref 0–99)
NonHDL: 99.16
Total CHOL/HDL Ratio: 3
Triglycerides: 119 mg/dL (ref 0.0–149.0)
VLDL: 23.8 mg/dL (ref 0.0–40.0)

## 2024-03-01 LAB — COMPREHENSIVE METABOLIC PANEL WITH GFR
ALT: 19 U/L (ref 0–53)
AST: 21 U/L (ref 0–37)
Albumin: 4.7 g/dL (ref 3.5–5.2)
Alkaline Phosphatase: 50 U/L (ref 39–117)
BUN: 14 mg/dL (ref 6–23)
CO2: 32 meq/L (ref 19–32)
Calcium: 9.8 mg/dL (ref 8.4–10.5)
Chloride: 100 meq/L (ref 96–112)
Creatinine, Ser: 0.96 mg/dL (ref 0.40–1.50)
GFR: 91.45 mL/min (ref >60.00)
Glucose, Bld: 75 mg/dL (ref 70–99)
Potassium: 4.1 meq/L (ref 3.5–5.1)
Sodium: 137 meq/L (ref 135–145)
Total Bilirubin: 0.4 mg/dL (ref 0.2–1.2)
Total Protein: 8 g/dL (ref 6.0–8.3)

## 2024-03-01 LAB — CBC
HCT: 43.3 % (ref 39.0–52.0)
Hemoglobin: 14.2 g/dL (ref 13.0–17.0)
MCHC: 32.9 g/dL (ref 30.0–36.0)
MCV: 82.9 fl (ref 78.0–100.0)
Platelets: 184 K/uL (ref 150.0–400.0)
RBC: 5.22 Mil/uL (ref 4.22–5.81)
RDW: 13.6 % (ref 11.5–15.5)
WBC: 3.5 K/uL — ABNORMAL LOW (ref 4.0–10.5)

## 2024-03-01 LAB — HEMOGLOBIN A1C: Hgb A1c MFr Bld: 6.3 % (ref 4.6–6.5)

## 2024-03-02 ENCOUNTER — Encounter: Admitting: Family Medicine

## 2024-03-04 ENCOUNTER — Ambulatory Visit: Payer: Self-pay | Admitting: Family Medicine

## 2024-03-04 ENCOUNTER — Encounter: Payer: Self-pay | Admitting: Family Medicine

## 2024-03-05 NOTE — Telephone Encounter (Signed)
 Pt would like to know if there is anything he can do to help raise WBC

## 2024-04-02 ENCOUNTER — Encounter: Admitting: Family Medicine

## 2024-04-08 ENCOUNTER — Other Ambulatory Visit: Payer: Self-pay | Admitting: Family Medicine

## 2024-04-08 DIAGNOSIS — E119 Type 2 diabetes mellitus without complications: Secondary | ICD-10-CM

## 2024-09-03 ENCOUNTER — Ambulatory Visit: Admitting: Family Medicine
